# Patient Record
Sex: Female | Born: 1975
Health system: Southern US, Community
[De-identification: ages and names within clinical notes are randomized; demographics above are authoritative.]

## PROBLEM LIST (undated history)

## (undated) DIAGNOSIS — K219 Gastro-esophageal reflux disease without esophagitis: Secondary | ICD-10-CM

## (undated) DIAGNOSIS — G4733 Obstructive sleep apnea (adult) (pediatric): Principal | ICD-10-CM

## (undated) DIAGNOSIS — E119 Type 2 diabetes mellitus without complications: Secondary | ICD-10-CM

## (undated) DIAGNOSIS — I10 Essential (primary) hypertension: Secondary | ICD-10-CM

## (undated) HISTORY — DX: Gastro-esophageal reflux disease without esophagitis: K21.9

## (undated) HISTORY — DX: Obstructive sleep apnea (adult) (pediatric): G47.33

## (undated) HISTORY — DX: Essential (primary) hypertension: I10

## (undated) HISTORY — DX: Morbid (severe) obesity due to excess calories: E66.01

## (undated) HISTORY — PX: TUBAL LIGATION: SHX77

## (undated) HISTORY — DX: Type 2 diabetes mellitus without complications: E11.9

---

## 2003-08-13 DIAGNOSIS — IMO0001 Reserved for inherently not codable concepts without codable children: Secondary | ICD-10-CM

## 2003-08-13 HISTORY — DX: Reserved for inherently not codable concepts without codable children: IMO0001

## 2006-03-06 ENCOUNTER — Encounter (INDEPENDENT_AMBULATORY_CARE_PROVIDER_SITE_OTHER): Payer: Self-pay | Admitting: Internal Medicine

## 2006-03-06 LAB — CONVERTED CEMR LAB: TSH: 0.692 microintl units/mL

## 2006-03-12 ENCOUNTER — Ambulatory Visit: Payer: Self-pay | Admitting: Internal Medicine

## 2006-04-07 ENCOUNTER — Encounter (INDEPENDENT_AMBULATORY_CARE_PROVIDER_SITE_OTHER): Payer: Self-pay | Admitting: Internal Medicine

## 2006-04-09 ENCOUNTER — Ambulatory Visit: Payer: Self-pay | Admitting: Internal Medicine

## 2006-05-08 ENCOUNTER — Ambulatory Visit: Payer: Self-pay | Admitting: Internal Medicine

## 2006-06-09 ENCOUNTER — Ambulatory Visit: Payer: Self-pay | Admitting: Internal Medicine

## 2006-07-11 ENCOUNTER — Encounter (INDEPENDENT_AMBULATORY_CARE_PROVIDER_SITE_OTHER): Payer: Self-pay | Admitting: Internal Medicine

## 2006-09-09 ENCOUNTER — Ambulatory Visit: Payer: Self-pay | Admitting: Internal Medicine

## 2006-09-10 ENCOUNTER — Encounter (INDEPENDENT_AMBULATORY_CARE_PROVIDER_SITE_OTHER): Payer: Self-pay | Admitting: Internal Medicine

## 2006-09-10 LAB — CONVERTED CEMR LAB
BUN: 12 mg/dL (ref 6–23)
CO2: 21 meq/L (ref 19–32)
Calcium: 9.4 mg/dL (ref 8.4–10.5)
Glucose, Bld: 149 mg/dL — ABNORMAL HIGH (ref 70–99)
HDL: 53 mg/dL (ref >39)
Potassium: 4.7 meq/L (ref 3.5–5.3)
Total CHOL/HDL Ratio: 3.4
Triglycerides: 54 mg/dL (ref <150)
VLDL: 11 mg/dL (ref 0–40)

## 2006-09-13 ENCOUNTER — Encounter (INDEPENDENT_AMBULATORY_CARE_PROVIDER_SITE_OTHER): Payer: Self-pay | Admitting: Internal Medicine

## 2006-09-13 LAB — CONVERTED CEMR LAB
Creatinine, Urine: 180.5 mg/dL
Microalb Creat Ratio: 8.2 mg/g (ref 0.0–30.0)

## 2006-09-17 ENCOUNTER — Telehealth (INDEPENDENT_AMBULATORY_CARE_PROVIDER_SITE_OTHER): Payer: Self-pay | Admitting: Internal Medicine

## 2006-09-17 ENCOUNTER — Ambulatory Visit: Payer: Self-pay | Admitting: Internal Medicine

## 2006-09-17 DIAGNOSIS — K219 Gastro-esophageal reflux disease without esophagitis: Secondary | ICD-10-CM | POA: Insufficient documentation

## 2006-09-17 DIAGNOSIS — E1159 Type 2 diabetes mellitus with other circulatory complications: Secondary | ICD-10-CM | POA: Insufficient documentation

## 2006-09-17 DIAGNOSIS — E1169 Type 2 diabetes mellitus with other specified complication: Secondary | ICD-10-CM

## 2006-09-17 DIAGNOSIS — I10 Essential (primary) hypertension: Secondary | ICD-10-CM | POA: Insufficient documentation

## 2006-09-17 DIAGNOSIS — E669 Obesity, unspecified: Secondary | ICD-10-CM

## 2006-10-07 ENCOUNTER — Ambulatory Visit: Payer: Self-pay | Admitting: Internal Medicine

## 2006-12-03 ENCOUNTER — Encounter (INDEPENDENT_AMBULATORY_CARE_PROVIDER_SITE_OTHER): Payer: Self-pay | Admitting: Internal Medicine

## 2006-12-24 ENCOUNTER — Encounter (INDEPENDENT_AMBULATORY_CARE_PROVIDER_SITE_OTHER): Payer: Self-pay | Admitting: Internal Medicine

## 2006-12-26 ENCOUNTER — Ambulatory Visit: Payer: Self-pay | Admitting: Internal Medicine

## 2007-03-31 ENCOUNTER — Ambulatory Visit: Payer: Self-pay | Admitting: Internal Medicine

## 2007-03-31 LAB — CONVERTED CEMR LAB: Hgb A1c MFr Bld: 6.7 %

## 2007-11-06 ENCOUNTER — Telehealth (INDEPENDENT_AMBULATORY_CARE_PROVIDER_SITE_OTHER): Payer: Self-pay | Admitting: Internal Medicine

## 2007-12-25 ENCOUNTER — Inpatient Hospital Stay (HOSPITAL_COMMUNITY): Admission: AD | Admit: 2007-12-25 | Discharge: 2007-12-25 | Payer: Self-pay | Admitting: Obstetrics and Gynecology

## 2008-01-17 ENCOUNTER — Inpatient Hospital Stay (HOSPITAL_COMMUNITY): Admission: AD | Admit: 2008-01-17 | Discharge: 2008-01-18 | Payer: Self-pay | Admitting: Obstetrics and Gynecology

## 2008-01-21 ENCOUNTER — Inpatient Hospital Stay (HOSPITAL_COMMUNITY): Admission: AD | Admit: 2008-01-21 | Discharge: 2008-01-21 | Payer: Self-pay | Admitting: Obstetrics and Gynecology

## 2008-01-25 ENCOUNTER — Encounter (INDEPENDENT_AMBULATORY_CARE_PROVIDER_SITE_OTHER): Payer: Self-pay | Admitting: Internal Medicine

## 2008-01-25 ENCOUNTER — Inpatient Hospital Stay (HOSPITAL_COMMUNITY): Admission: AD | Admit: 2008-01-25 | Discharge: 2008-01-29 | Payer: Self-pay | Admitting: Obstetrics and Gynecology

## 2008-01-26 ENCOUNTER — Encounter (INDEPENDENT_AMBULATORY_CARE_PROVIDER_SITE_OTHER): Payer: Self-pay | Admitting: Obstetrics and Gynecology

## 2008-05-02 ENCOUNTER — Ambulatory Visit: Payer: Self-pay | Admitting: Internal Medicine

## 2008-05-03 LAB — CONVERTED CEMR LAB
ALT: 11 units/L (ref 0–35)
Albumin: 4.2 g/dL (ref 3.5–5.2)
BUN: 10 mg/dL (ref 6–23)
Calcium: 8.9 mg/dL (ref 8.4–10.5)
Creatinine, Ser: 0.7 mg/dL (ref 0.40–1.20)
HDL: 45 mg/dL (ref >39)
LDL Cholesterol: 117 mg/dL — ABNORMAL HIGH (ref 0–99)
Total Bilirubin: 0.4 mg/dL (ref 0.3–1.2)
Total Protein: 7.3 g/dL (ref 6.0–8.3)
VLDL: 21 mg/dL (ref 0–40)

## 2008-05-30 ENCOUNTER — Telehealth (INDEPENDENT_AMBULATORY_CARE_PROVIDER_SITE_OTHER): Payer: Self-pay | Admitting: Internal Medicine

## 2008-08-01 ENCOUNTER — Ambulatory Visit: Payer: Self-pay | Admitting: Internal Medicine

## 2008-08-01 LAB — CONVERTED CEMR LAB: Hgb A1c MFr Bld: 6.5 %

## 2008-08-03 LAB — CONVERTED CEMR LAB
Creatinine, Urine: 178.7 mg/dL
Microalb Creat Ratio: 32.9 mg/g — ABNORMAL HIGH (ref 0.0–30.0)
Microalb, Ur: 5.88 mg/dL — ABNORMAL HIGH (ref 0.00–1.89)

## 2008-10-25 LAB — HM DIABETES EYE EXAM: HM Diabetic Eye Exam: NORMAL

## 2008-10-31 ENCOUNTER — Ambulatory Visit: Payer: Self-pay | Admitting: Internal Medicine

## 2008-10-31 LAB — CONVERTED CEMR LAB: Hgb A1c MFr Bld: 7 %

## 2008-11-04 ENCOUNTER — Ambulatory Visit (HOSPITAL_COMMUNITY): Admission: RE | Admit: 2008-11-04 | Discharge: 2008-11-04 | Payer: Self-pay | Admitting: Internal Medicine

## 2008-11-04 ENCOUNTER — Encounter (INDEPENDENT_AMBULATORY_CARE_PROVIDER_SITE_OTHER): Payer: Self-pay | Admitting: Internal Medicine

## 2008-11-14 ENCOUNTER — Ambulatory Visit: Payer: Self-pay | Admitting: Internal Medicine

## 2008-11-17 ENCOUNTER — Encounter (INDEPENDENT_AMBULATORY_CARE_PROVIDER_SITE_OTHER): Payer: Self-pay | Admitting: Internal Medicine

## 2008-11-18 LAB — CONVERTED CEMR LAB
Alkaline Phosphatase: 83 units/L (ref 39–117)
BUN: 12 mg/dL (ref 6–23)
CO2: 25 meq/L (ref 19–32)
Calcium: 9.4 mg/dL (ref 8.4–10.5)
Chloride: 104 meq/L (ref 96–112)
Creatinine, Ser: 0.75 mg/dL (ref 0.40–1.20)
Glucose, Bld: 162 mg/dL — ABNORMAL HIGH (ref 70–99)
HDL: 44 mg/dL (ref >39)
LDL Cholesterol: 106 mg/dL — ABNORMAL HIGH (ref 0–99)
Total Bilirubin: 0.3 mg/dL (ref 0.3–1.2)
Total CHOL/HDL Ratio: 3.7
Triglycerides: 56 mg/dL (ref <150)
VLDL: 11 mg/dL (ref 0–40)

## 2008-12-05 ENCOUNTER — Encounter (INDEPENDENT_AMBULATORY_CARE_PROVIDER_SITE_OTHER): Payer: Self-pay | Admitting: Internal Medicine

## 2008-12-06 ENCOUNTER — Encounter (INDEPENDENT_AMBULATORY_CARE_PROVIDER_SITE_OTHER): Payer: Self-pay | Admitting: Internal Medicine

## 2009-01-23 ENCOUNTER — Ambulatory Visit: Payer: Self-pay | Admitting: Internal Medicine

## 2009-01-23 DIAGNOSIS — B353 Tinea pedis: Secondary | ICD-10-CM | POA: Insufficient documentation

## 2009-01-23 LAB — CONVERTED CEMR LAB: Hgb A1c MFr Bld: 7.2 %

## 2009-02-23 ENCOUNTER — Other Ambulatory Visit: Admission: RE | Admit: 2009-02-23 | Discharge: 2009-02-23 | Payer: Self-pay | Admitting: Obstetrics and Gynecology

## 2009-02-23 ENCOUNTER — Telehealth (INDEPENDENT_AMBULATORY_CARE_PROVIDER_SITE_OTHER): Payer: Self-pay | Admitting: *Deleted

## 2009-04-10 ENCOUNTER — Encounter (INDEPENDENT_AMBULATORY_CARE_PROVIDER_SITE_OTHER): Payer: Self-pay | Admitting: Internal Medicine

## 2009-08-14 ENCOUNTER — Ambulatory Visit: Payer: Self-pay | Admitting: Family Medicine

## 2009-08-14 DIAGNOSIS — R5383 Other fatigue: Secondary | ICD-10-CM

## 2009-08-14 DIAGNOSIS — R5381 Other malaise: Secondary | ICD-10-CM | POA: Insufficient documentation

## 2009-08-14 LAB — CONVERTED CEMR LAB
AST: 14 units/L (ref 0–37)
BUN: 12 mg/dL (ref 6–23)
CO2: 24 meq/L (ref 19–32)
Calcium: 9.6 mg/dL (ref 8.4–10.5)
Creatinine, Ser: 0.8 mg/dL (ref 0.40–1.20)
Creatinine, Urine: 289.5 mg/dL
Glucose, Bld: 169 mg/dL
Glucose, Bld: 143 mg/dL — ABNORMAL HIGH (ref 70–99)
HCT: 37.2 % (ref 36.0–46.0)
HDL: 37 mg/dL — ABNORMAL LOW (ref >39)
Hemoglobin: 12.3 g/dL (ref 12.0–15.0)
Hgb A1c MFr Bld: 6.7 %
LDL Cholesterol: 106 mg/dL — ABNORMAL HIGH (ref 0–99)
MCHC: 33.1 g/dL (ref 30.0–36.0)
MCV: 80.3 fL (ref 78.0–100.0)
RBC: 4.63 M/uL (ref 3.87–5.11)
TSH: 0.385 microintl units/mL (ref 0.350–4.500)
Total Bilirubin: 0.6 mg/dL (ref 0.3–1.2)
Total Protein: 7.3 g/dL (ref 6.0–8.3)
WBC: 9.9 10*3/uL (ref 4.0–10.5)

## 2009-08-22 DIAGNOSIS — E669 Obesity, unspecified: Secondary | ICD-10-CM | POA: Insufficient documentation

## 2009-10-16 ENCOUNTER — Telehealth: Payer: Self-pay | Admitting: Physician Assistant

## 2009-10-18 ENCOUNTER — Telehealth: Payer: Self-pay | Admitting: Family Medicine

## 2009-10-23 ENCOUNTER — Ambulatory Visit: Payer: Self-pay | Admitting: Family Medicine

## 2009-12-05 ENCOUNTER — Ambulatory Visit: Payer: Self-pay | Admitting: Family Medicine

## 2009-12-05 LAB — HM DIABETES FOOT EXAM

## 2009-12-06 LAB — CONVERTED CEMR LAB
Calcium: 9.7 mg/dL (ref 8.4–10.5)
Glucose, Bld: 106 mg/dL — ABNORMAL HIGH (ref 70–99)
Potassium: 4.1 meq/L (ref 3.5–5.3)

## 2010-03-22 ENCOUNTER — Encounter: Payer: Self-pay | Admitting: Family Medicine

## 2010-03-22 ENCOUNTER — Other Ambulatory Visit: Admission: RE | Admit: 2010-03-22 | Discharge: 2010-03-22 | Payer: Self-pay | Admitting: Obstetrics and Gynecology

## 2010-03-28 ENCOUNTER — Telehealth: Payer: Self-pay | Admitting: Family Medicine

## 2010-03-28 ENCOUNTER — Ambulatory Visit: Payer: Self-pay | Admitting: Family Medicine

## 2010-03-28 DIAGNOSIS — M5432 Sciatica, left side: Secondary | ICD-10-CM | POA: Insufficient documentation

## 2010-03-30 ENCOUNTER — Encounter: Payer: Self-pay | Admitting: Family Medicine

## 2010-03-30 ENCOUNTER — Ambulatory Visit (HOSPITAL_COMMUNITY): Admission: RE | Admit: 2010-03-30 | Discharge: 2010-03-30 | Payer: Self-pay | Admitting: Family Medicine

## 2010-04-02 LAB — CONVERTED CEMR LAB
ALT: 23 units/L (ref 0–35)
AST: 17 units/L (ref 0–37)
Alkaline Phosphatase: 72 units/L (ref 39–117)
CO2: 32 meq/L (ref 19–32)
Glucose, Bld: 140 mg/dL — ABNORMAL HIGH (ref 70–99)
HDL: 39 mg/dL — ABNORMAL LOW (ref >39)
Hgb A1c MFr Bld: 7.2 % — ABNORMAL HIGH (ref <5.7)
LDL Cholesterol: 82 mg/dL (ref 0–99)
Sodium: 141 meq/L (ref 135–145)
Total CHOL/HDL Ratio: 3.5
Total Protein: 7 g/dL (ref 6.0–8.3)
Triglycerides: 77 mg/dL (ref <150)
VLDL: 15 mg/dL (ref 0–40)

## 2010-04-06 ENCOUNTER — Telehealth: Payer: Self-pay | Admitting: Family Medicine

## 2010-04-19 ENCOUNTER — Telehealth: Payer: Self-pay | Admitting: Family Medicine

## 2010-05-01 ENCOUNTER — Ambulatory Visit (HOSPITAL_COMMUNITY): Admission: RE | Admit: 2010-05-01 | Discharge: 2010-05-01 | Payer: Self-pay | Admitting: Family Medicine

## 2010-05-02 ENCOUNTER — Telehealth: Payer: Self-pay | Admitting: Family Medicine

## 2010-05-03 ENCOUNTER — Telehealth: Payer: Self-pay | Admitting: Family Medicine

## 2010-05-21 ENCOUNTER — Encounter: Payer: Self-pay | Admitting: Family Medicine

## 2010-05-25 HISTORY — PX: BACK SURGERY: SHX140

## 2010-07-11 ENCOUNTER — Ambulatory Visit: Payer: Self-pay | Admitting: Family Medicine

## 2010-07-13 ENCOUNTER — Telehealth: Payer: Self-pay | Admitting: Family Medicine

## 2010-07-17 ENCOUNTER — Encounter: Payer: Self-pay | Admitting: Family Medicine

## 2010-07-23 ENCOUNTER — Telehealth: Payer: Self-pay | Admitting: Family Medicine

## 2010-09-11 NOTE — Assessment & Plan Note (Signed)
Summary: BLOOD SUGARS   Vital Signs:  Patient profile:   35 year old female Height:      63 inches Weight:      195 pounds BMI:     34.67 O2 Sat:      97 % Pulse rate:   98 / minute Pulse rhythm:   regular Resp:     16 per minute BP sitting:   120 / 72  (left arm) Cuff size:   large  Vitals Entered By: Everitt Amber LPN (October 23, 2009 10:13 AM)  Nutrition Counseling: Patient's BMI is greater than 25 and therefore counseled on weight management options. CC: Follow up chronic problems Is Patient Diabetic? Yes   Primary Care Provider:  Syliva Overman MD  CC:  Follow up chronic problems.  History of Present Illness: Pt called in last week stating her blood sugars are still high. She has tolerated janumet whereas she did not tolerate metformin. She has been exercising , has cut calories and lost 4 pounds.She is asymptomatic as far as uncontrolled blood sugars are concerned. She has otherwise been well with no complaints or concerns.  Denies recent fever or chills. Denies sinus pressure, nasal congestion , ear pain or sore throat. Denies chest congestion, or cough productive of sputum. Denies chest pain, palpitations, PND, orthopnea or leg swelling. Denies abdominal pain, nausea, vomitting, diarrhea or constipation. Denies change in bowel movements or bloody stool. Denies dysuria , frequency, incontinence or hesitancy. Denies  joint pain, swelling, or reduced mobility. Denies headaches, vertigo, seizures. Denies depression, anxiety or insomnia. Denies  rash, lesions, or itch.     Current Medications (verified): 1)  Glucometer Strips For One Touch Ultra Mini .... Use As Directed 2)  Lancets .... Use As Directed 3)  Propranolol Hcl 40 Mg Tabs (Propranolol Hcl) .Marland Kitchen.. 1 By Mouth Two Times A Day 4)  Lisinopril 20 Mg Tabs (Lisinopril) .... Take 1 Tablet By Mouth Once A Day 5)  Hydrochlorothiazide 25 Mg Tabs (Hydrochlorothiazide) .... Take 1 Tablet By Mouth Once A Day 6)   Janumet 50-500 Mg Tabs (Sitagliptin-Metformin Hcl) .... Take 1 Tablet By Mouth Two Times A Day 7)  Onetouch Ultra Test  Strp (Glucose Blood) .... Once Daily Testing  Allergies (verified): 1)  Amlodipine Besylate (Amlodipine Besylate)  Review of Systems Eyes:  Denies blurring, discharge, eye pain, and red eye. Endo:  Denies cold intolerance, excessive hunger, excessive thirst, excessive urination, heat intolerance, polyuria, and weight change; tests twice daily at different times fastings avg 160's, lower #'s tend to be early afternoon at 130's. Heme:  Denies abnormal bruising and bleeding. Allergy:  Denies hives or rash and itching eyes.  Physical Exam  General:  Well-developed,well-nourished,in no acute distress; alert,appropriate and cooperative throughout examination HEENT: No facial asymmetry,  EOMI, No sinus tenderness, TM's Clear, oropharynx  pink and moist.   Chest: Clear to auscultation bilaterally.  CVS: S1, S2, No murmurs, No S3.   Abd: Soft, Nontender.  MS: Adequate ROM spine, hips, shoulders and knees.  Ext: No edema.   CNS: CN 2-12 intact, power tone and sensation normal throughout.   Skin: Intact, no visible lesions or rashes.  Psych: Good eye contact, normal affect.  Memory intact, not anxious or depressed appearing.    Impression & Recommendations:  Problem # 1:  OBESITY (ICD-278.00) Assessment Improved  Ht: 63 (10/23/2009)   Wt: 195 (10/23/2009)   BMI: 34.67 (10/23/2009)  Problem # 2:  HYPERLIPIDEMIA (ICD-272.4) Assessment: Comment Only  Her updated medication list  for this problem includes:    Lovastatin 10 Mg Tabs (Lovastatin) .Marland Kitchen... Take 1 tab by mouth at bedtime  Labs Reviewed: SGOT: 14 (08/14/2009)   SGPT: 19 (08/14/2009)   HDL:37 (08/14/2009), 44 (11/17/2008)  LDL:106 (08/14/2009), 106 (11/17/2008)  Chol:154 (08/14/2009), 161 (11/17/2008)  Trig:55 (08/14/2009), 56 (11/17/2008)  Problem # 3:  DIABETES MELLITUS, TYPE II, CONTROLLED, W/O COMPLICATIONS  (ICD-250.00) Assessment: Comment Only  The following medications were removed from the medication list:    Janumet 50-500 Mg Tabs (Sitagliptin-metformin hcl) .Marland Kitchen... Take 1 tablet by mouth two times a day Her updated medication list for this problem includes:    Lisinopril 20 Mg Tabs (Lisinopril) .Marland Kitchen... Take 1 tablet by mouth once a day    Janumet 50-1000 Mg Tabs (Sitagliptin-metformin hcl) .Marland Kitchen... Take 1 tablet by mouth two times a day    Metformin Hcl 500 Mg Tabs (Metformin hcl) .Marland Kitchen... Take 1 tablet by mouth two times a day  Labs Reviewed: Creat: 0.80 (08/14/2009)     Last Eye Exam: normal (10/25/2008) Reviewed HgBA1c results: 6.7 (08/14/2009)  7.2 (01/23/2009)  Problem # 4:  HYPERTENSION (ICD-401.9) Assessment: Unchanged  Her updated medication list for this problem includes:    Propranolol Hcl 40 Mg Tabs (Propranolol hcl) .Marland Kitchen... 1 by mouth two times a day    Lisinopril 20 Mg Tabs (Lisinopril) .Marland Kitchen... Take 1 tablet by mouth once a day    Hydrochlorothiazide 25 Mg Tabs (Hydrochlorothiazide) .Marland Kitchen... Take 1 tablet by mouth once a day  BP today: 120/72 Prior BP: 120/80 (08/14/2009)  Labs Reviewed: K+: 4.4 (08/14/2009) Creat: : 0.80 (08/14/2009)   Chol: 154 (08/14/2009)   HDL: 37 (08/14/2009)   LDL: 106 (08/14/2009)   TG: 55 (08/14/2009)  Complete Medication List: 1)  Glucometer Strips For One Touch Ultra Mini  .... Use as directed 2)  Lancets  .... Use as directed 3)  Propranolol Hcl 40 Mg Tabs (Propranolol hcl) .Marland Kitchen.. 1 by mouth two times a day 4)  Lisinopril 20 Mg Tabs (Lisinopril) .... Take 1 tablet by mouth once a day 5)  Hydrochlorothiazide 25 Mg Tabs (Hydrochlorothiazide) .... Take 1 tablet by mouth once a day 6)  Onetouch Ultra Test Strp (Glucose blood) .... Once daily testing 7)  Janumet 50-1000 Mg Tabs (Sitagliptin-metformin hcl) .... Take 1 tablet by mouth two times a day 8)  Lovastatin 10 Mg Tabs (Lovastatin) .... Take 1 tab by mouth at bedtime 9)  Metformin Hcl 500 Mg Tabs  (Metformin hcl) .... Take 1 tablet by mouth two times a day  Patient Instructions: 1)  F/U in 6 weeks.Cancel earlier appt pls. 2)  New  dose janumetis 50/1000 one twice daily, use metformin 500mg  one twice daily with current lowerdose of janumet till all done. 3)  Congrats on weight loss, keep it up. 4)  It is important that you exercise regularly at least 60 minutes 6 times a week. If you develop chest pain, have severe difficulty breathing, or feel very tired , stop exercising immediately and seek medical attention. Prescriptions: METFORMIN HCL 500 MG TABS (METFORMIN HCL) Take 1 tablet by mouth two times a day  #60 x 0   Entered and Authorized by:   Syliva Overman MD   Signed by:   Syliva Overman MD on 10/23/2009   Method used:   Print then Give to Patient   RxID:   [id number] LOVASTATIN 10 MG TABS (LOVASTATIN) Take 1 tab by mouth at bedtime  #90 x 1   Entered and Authorized by:  Syliva Overman MD   Signed by:   Syliva Overman MD on 10/23/2009   Method used:   Electronically to        [facility] (retail)       [address]       [place], [place]  [address]       Ph: [phone]       Fax: [phone]   RxID:   [id number] JANUMET 50-1000 MG TABS (SITAGLIPTIN-METFORMIN HCL) Take 1 tablet by mouth two times a day  #60 x 3   Entered and Authorized by:   Syliva Overman MD   Signed by:   Syliva Overman MD on 10/23/2009   Method used:   Printed then faxed to ...       [facility] (retail)       [address]       [place], [place]  [address]       Ph: [phone]       Fax: [phone]   RxID:   [id number]

## 2010-09-11 NOTE — Progress Notes (Signed)
Summary: lab results  Phone Note Call from Patient   Summary of Call: wants to get lab results call back at [phone] Initial call taken by: Lind Guest,  July 13, 2010 11:14 AM  Follow-up for Phone Call        see lab append Follow-up by: Adella Hare LPN,  July 13, 2010 2:10 PM

## 2010-09-11 NOTE — Assessment & Plan Note (Signed)
Summary: office visit   Vital Signs:  Patient profile:   35 year old female Height:      63 inches Weight:      195.50 pounds BMI:     34.76 O2 Sat:      98 % on Room air Pulse rate:   75 / minute Pulse rhythm:   regular Resp:     16 per minute BP sitting:   136 / 92  (left arm)  Vitals Entered By: Adella Hare LPN (July 11, 2010 8:18 AM)  Nutrition Counseling: Patient's BMI is greater than 25 and therefore counseled on weight management options.  O2 Flow:  Room air CC: follow-up visit Is Patient Diabetic? Yes Did you bring your meter with you today? No Pain Assessment Patient in pain? no        Primary Care Provider:  Syliva Overman MD  CC:  follow-up visit.  History of Present Illness: Doing well since surgery on her back. No pain Intolerant of metformin, now wants to wait 1 yr before conception, blood sugars are uncontrolled , interested in going back on janumet if she is able to afford it, without a coupon she recently paid $40. Reports  that she has been doing generally well otherwise Denies recent fever or chills. Denies sinus pressure, nasal congestion , ear pain or sore throat. Denies chest congestion, or cough productive of sputum. Denies chest pain, palpitations, PND, orthopnea or leg swelling. Denies abdominal pain, nausea, vomitting, diarrhea or constipation. Denies change in bowel movements or bloody stool. Denies dysuria , frequency, incontinence or hesitancy. Denies  joint pain, swelling, or reduced mobility. Denies headaches, vertigo, seizures. Denies depression, anxiety or insomnia. Denies  rash, lesions, or itch.     Current Medications (verified): 1)  Propranolol Hcl 40 Mg Tabs (Propranolol Hcl) .Marland Kitchen.. 1 By Mouth Two Times A Day 2)  Hydrochlorothiazide 25 Mg Tabs (Hydrochlorothiazide) .... Take 1 Tablet By Mouth Once A Day 3)  Onetouch Ultra Test  Strp (Glucose Blood) .... Once Daily Testing 4)  Medroxyprogesterone Acetate 10 Mg Tabs  (Medroxyprogesterone Acetate) .... Take 1 Tab By Mouth At Bedtime For 10 Days Every 2 Months 5)  Metformin Hcl 1000 Mg Tabs (Metformin Hcl) .... Take 1 Tablet By Mouth Two Times A Day 6)  Glyburide 2.5 Mg Tabs (Glyburide) .... Take 1 Tablet By Mouth Two Times A Day 7)  Onetouch Ultrasoft Lancets  Misc (Lancets) .... Once Daily Testing  Allergies (verified): 1)  Amlodipine Besylate (Amlodipine Besylate)  Past History:  Past medical, surgical, family and social histories (including risk factors) reviewed, and no changes noted (except as noted below).  Past Medical History: Reviewed history from 08/14/2009 and no changes required. GERD Hypertension   since approx 2005 constipation NIDDM, no complications since approx 2005 mild B/L cataracts morbid obesity  Past Surgical History: Caesarean section 6/09 Back surgery 05/25/2010, Dr Ovid Curd  Family History: Reviewed history from 08/14/2009 and no changes required. Father-58-DM, HTN, kidney disease, esrd Mother-54-DM- HTN Brother-33, healthy  Social History: Reviewed history from 08/14/2009 and no changes required. 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 MS:  Complains of low back pain and mid back pain. Endo:  Denies excessive thirst  and excessive urination; fasting in past 2 weeks  90 to 215, pre dinner 19 to 183. Heme:  Denies abnormal bruising and bleeding. Allergy:  Denies hives or rash and itching eyes.  Physical Exam  General:  Well-developed,well-nourished,in no acute distress; alert,appropriate and cooperative throughout examination HEENT: No facial asymmetry,  EOMI, No sinus tenderness, TM's Clear, oropharynx  pink and moist.   Chest:  Clear to auscultation bilaterally.  CVS: S1, S2, No murmurs, No S3.   Abd: Soft, Nontender.  MS: adequate  ROM spine,adequate in  hips, shoulders and knees.  Ext: No edema.   CNS: CN 2-12 intact, power and  tone normal,sensationreduced in right lower extremity  Skin: Intact, no visible lesions or rashes.  Psych: Good eye contact, normal affect.  Memory intact, not anxious or depressed appearing.    Impression & Recommendations:  Problem # 1:  BACK PAIN WITH RADICULOPATHY (ICD-729.2) Assessment Improved s/p surgery  Problem # 2:  OBESITY (ICD-278.00) Assessment: Unchanged  Ht: 63 (07/11/2010)   Wt: 195.50 (07/11/2010)   BMI: 34.76 (07/11/2010) therapeutic lifestyle change discussed and encouraged  Problem # 3:  DIABETES MELLITUS, TYPE II, CONTROLLED, W/O COMPLICATIONS (ICD-250.00) Assessment: Deteriorated  The following medications were removed from the medication list:    Metformin Hcl 1000 Mg Tabs (Metformin hcl) .Marland Kitchen... Take 1 tablet by mouth two times a day Her updated medication list for this problem includes:    Glyburide 2.5 Mg Tabs (Glyburide) .Marland Kitchen... Take 1 tablet by mouth two times a day    Janumet 50-500 Mg Tabs (Sitagliptin-metformin hcl) .Marland Kitchen... Take 1 tablet by mouth two times a day  Orders: T- Hemoglobin A1C ([address]) T- Hemoglobin A1C ([address]-91478)  Labs Reviewed: Creat: 0.77 (03/26/2010)     Last Eye Exam: normal (10/25/2008) Reviewed HgBA1c results: 7.2 (03/26/2010)  7.3 (12/05/2009)  Problem # 4:  HYPERTENSION (ICD-401.9) Assessment: Deteriorated  Her updated medication list for this problem includes:    Propranolol Hcl 40 Mg Tabs (Propranolol hcl) .Marland Kitchen... 1 by mouth two times a day    Hydrochlorothiazide 25 Mg Tabs (Hydrochlorothiazide) .Marland Kitchen... Take 1 tablet by mouth once a day Patient advised to follow low sodium diet rich in fruit and vegetables, and to commit to at least 30 minutes 5 days per week of regular exercise , to improve blood presure control.    BP today: 136/92 Prior BP: 110/68 (03/28/2010)  Labs Reviewed: K+: 3.6 (03/26/2010) Creat: : 0.77 (03/26/2010)   Chol: 136 (03/26/2010)   HDL: 39 (03/26/2010)   LDL: 82 (03/26/2010)   TG: 77 (03/26/2010)  Complete Medication List: 1)  Propranolol Hcl 40 Mg Tabs (Propranolol hcl) .Marland Kitchen.. 1 by mouth two times a day 2)  Hydrochlorothiazide 25 Mg Tabs (Hydrochlorothiazide) .... Take 1 tablet by mouth once a day 3)  Onetouch Ultra Test Strp (Glucose blood) .... Once daily testing 4)  Medroxyprogesterone Acetate 10 Mg Tabs (Medroxyprogesterone acetate) .... Take 1 tab by mouth at bedtime for 10 days every 2 months 5)  Glyburide 2.5 Mg Tabs (Glyburide) .... Take 1 tablet by mouth two times a day 6)  Onetouch Ultrasoft Lancets Misc (Lancets) .... Once daily testing 7)  Janumet 50-500 Mg Tabs (Sitagliptin-metformin hcl) .... Take 1 tablet by mouth two times a day  Other Orders: T-Basic Metabolic Panel [id number] [id number])  Patient Instructions: 1)  Please schedule a follow-up appointment in 3.5 months. 2)  It is important that you exercise regularly at least 20 minutes 5 times a week. If you develop chest pain, have severe difficulty  breathing, or feel very tired , stop exercising immediately and seek medical attention. 3)  You need to lose weight. Consider a lower calorie diet and regular exercise.  4)  Check your blood sugars regularly.Once daily. If your readings are usually above 200 or below 70 you should contact our office. 5)  PLS get a flu vaccine 6)  HbgA1C prior to visit, ICD-9:  today, 7)  HBA1C in 3.5 months and chem 7 8)  Stop metformin pls, resume janumet as before , and continue the glyburide. Prescriptions: JANUMET 50-500 MG TABS (SITAGLIPTIN-METFORMIN HCL) Take 1 tablet by mouth two times a day  #60 x 4   Entered and Authorized by:   Syliva Overman MD   Signed by:   Syliva Overman MD on 07/11/2010   Method used:   Printed then faxed to ...       [facility]  (retail)       [address]       [place], [place]  [address]       Ph: [phone]       Fax: [phone]   RxID:   [id number]    Orders Added: 1)  Est. Patient Level IV [[address]214] 2)  T- Hemoglobin A1C [83036-23375] 3)  T-Basic Metabolic Panel [[id number]] 4)  T- Hemoglobin A1C [83036-23375]   janumet 50/500 samples given lot [id number] exp 4/13

## 2010-09-11 NOTE — Miscellaneous (Signed)
Summary: flu shot  Clinical Lists Changes  Observations: Added new observation of FLU VAX: Historical (03/06/2010 9:01)      Influenza Immunization History:    Influenza # 1:  Historical (03/06/2010)

## 2010-09-11 NOTE — Assessment & Plan Note (Signed)
Summary: ov   Vital Signs:  Patient profile:   35 year old female Height:      63 inches Weight:      189 pounds BMI:     33.60 O2 Sat:      98 % Pulse rate:   75 / minute Pulse rhythm:   regular Resp:     16 per minute BP sitting:   110 / 68  (left arm) Cuff size:   large  Vitals Entered By: Everitt Amber LPN (March 28, 2010 7:58 AM)  Nutrition Counseling: Patient's BMI is greater than 25 and therefore counseled on weight management options. CC: started having pain in back and she went to the ER and they said it was back spasms, then it moved down her left leg and has been causing left leg to ache and go numb   Primary Care Provider:  Syliva Overman MD  CC:  started having pain in back and she went to the ER and they said it was back spasms and then it moved down her left leg and has been causing left leg to ache and go numb.  History of Present Illness: Pt reports in June she had acute back pain after spending several nights in the hosp with her spouise, ED provided robaxin and diclofenac and it resolved. For the past 3 weeks she has been havign right lower ext pain, knee to buttocks, amd she decribes numbness from ankle to toes when she walks alot.  She has been experiencing muscle spasm and seeing her thigh muscles jump intermittently.Pain is as severe as an 8 at times she is concerned thaT HER BLOOD SUGARS HAVE BEEN UNCONTROLLED , WITH FASTINGS  averaging over 130. She has decided that she ants to have another child in the next 1 year, and wants to discuss review of her chronic meds in this light.  Allergies: 1)  Amlodipine Besylate (Amlodipine Besylate)  Review of Systems      See HPI Eyes:  Denies blurring, discharge, eye pain, and red eye. MS:  Complains of low back pain, mid back pain, muscle aches, and muscle weakness. Neuro:  Complains of numbness and tingling. Endo:  Denies excessive thirst and excessive urination. Heme:  Denies abnormal bruising and  bleeding. Allergy:  Denies hives or rash and itching eyes.  Physical Exam  General:  Well-developed,well-nourished,in no acute distress; alert,appropriate and cooperative throughout examination HEENT: No facial asymmetry,  EOMI, No sinus tenderness, TM's Clear, oropharynx  pink and moist.   Chest: Clear to auscultation bilaterally.  CVS: S1, S2, No murmurs, No S3.   Abd: Soft, Nontender.  MS: decreased  ROM spine,adequate in  hips, shoulders and knees.  Ext: No edema.   CNS: CN 2-12 intact, power and  tone normal,sensationreduced in right lower extremity  Skin: Intact, no visible lesions or rashes.  Psych: Good eye contact, normal affect.  Memory intact, not anxious or depressed appearing.    Impression & Recommendations:  Problem # 1:  BACK PAIN WITH RADICULOPATHY (ICD-729.2) Assessment Deteriorated  Orders: Diagnostic X-Ray/Fluoroscopy (Diagnostic X-Ray/Flu) Depo- Medrol 80mg  (J1040) Ketorolac-Toradol 15mg  (Z6109) Admin of Therapeutic Inj  intramuscular or subcutaneous (60454)  Problem # 2:  OBESITY (ICD-278.00) Assessment: Unchanged  Ht: 63 (03/28/2010)   Wt: 189 (03/28/2010)   BMI: 33.60 (03/28/2010)  Problem # 3:  DIABETES MELLITUS, TYPE II, CONTROLLED, W/O COMPLICATIONS (ICD-250.00) Assessment: Unchanged  The following medications were removed from the medication list:    Lisinopril 20 Mg Tabs (Lisinopril) .Marland Kitchen... Take  1 tablet by mouth once a day    Janumet 50-1000 Mg Tabs (Sitagliptin-metformin hcl) .Marland Kitchen... Take 1 tablet by mouth two times a day Her updated medication list for this problem includes:    Metformin Hcl 1000 Mg Tabs (Metformin hcl) .Marland Kitchen... Take 1 tablet by mouth two times a day    Glyburide 2.5 Mg Tabs (Glyburide) .Marland Kitchen... Take 1 tablet by mouth two times a day  Labs Reviewed: Creat: 0.77 (03/26/2010)     Last Eye Exam: normal (10/25/2008) Reviewed HgBA1c results: 7.2 (03/26/2010)  7.3 (12/05/2009)  Problem # 4:  HYPERTENSION (ICD-401.9) Assessment:  Unchanged  The following medications were removed from the medication list:    Lisinopril 20 Mg Tabs (Lisinopril) .Marland Kitchen... Take 1 tablet by mouth once a day Her updated medication list for this problem includes:    Propranolol Hcl 40 Mg Tabs (Propranolol hcl) .Marland Kitchen... 1 by mouth two times a day    Hydrochlorothiazide 25 Mg Tabs (Hydrochlorothiazide) .Marland Kitchen... Take 1 tablet by mouth once a day  BP today: 110/68 Prior BP: 120/80 (12/05/2009)  Labs Reviewed: K+: 3.6 (03/26/2010) Creat: : 0.77 (03/26/2010)   Chol: 136 (03/26/2010)   HDL: 39 (03/26/2010)   LDL: 82 (03/26/2010)   TG: 77 (03/26/2010)  Complete Medication List: 1)  Glucometer Strips For One Touch Ultra Mini  .... Use as directed 2)  Lancets  .... Use as directed 3)  Propranolol Hcl 40 Mg Tabs (Propranolol hcl) .Marland Kitchen.. 1 by mouth two times a day 4)  Hydrochlorothiazide 25 Mg Tabs (Hydrochlorothiazide) .... Take 1 tablet by mouth once a day 5)  Onetouch Ultra Test Strp (Glucose blood) .... Once daily testing 6)  Medroxyprogesterone Acetate 10 Mg Tabs (Medroxyprogesterone acetate) .... Take 1 tab by mouth at bedtime for 10 days every 2 months 7)  Ibuprofen 800 Mg Tabs (Ibuprofen) .... Take 1 tablet by mouth three times a day for 10 days 8)  Vicodin 5-500 Mg Tabs (Hydrocodone-acetaminophen) .... Take 1 tab by mouth at bedtime as needed 9)  Metformin Hcl 1000 Mg Tabs (Metformin hcl) .... Take 1 tablet by mouth two times a day 10)  Glyburide 2.5 Mg Tabs (Glyburide) .... Take 1 tablet by mouth two times a day  Patient Instructions: 1)  Please schedule a follow-up appointment in 3 months. 2)  It is important that you exercise regularly at least 20 minutes 5 times a week. If you develop chest pain, have severe difficulty breathing, or feel very tired , stop exercising immediately and seek medical attention. 3)  You need to lose weight. Consider a lower calorie diet and regular exercise.  4)  Check your blood sugars regularly. If your readings are  usually above : or below 70 you should contact our office. 5)  You will be treated for sciatica, and get an xray of your back. 6)  If symoptioms persist call for further management pls. 7)  Pls stop the lovastatin, lisinopril and janumet. 8)  I will call aboutyour 2nd diabetic med, for now you are on metformin Prescriptions: GLYBURIDE 2.5 MG TABS (GLYBURIDE) Take 1 tablet by mouth two times a day  #60 x 3   Entered and Authorized by:   Syliva Overman MD   Signed by:   Syliva Overman MD on 03/30/2010   Method used:   Printed then faxed to ...       [facility] (retail)       [address]  [place], [place]  [address]       Ph: [phone]       Fax: [phone]   RxID:   [id number] METFORMIN HCL 1000 MG TABS (METFORMIN HCL) Take 1 tablet by mouth two times a day  #60 x 3   Entered and Authorized by:   Syliva Overman MD   Signed by:   Syliva Overman MD on 03/28/2010   Method used:   Printed then faxed to ...       [facility] (retail)       [address]       [place], [place]  [address]       Ph: [phone]       Fax: [phone]   RxID:   [id number] VICODIN 5-500 MG TABS (HYDROCODONE-ACETAMINOPHEN) Take 1 tab by mouth at bedtime as needed  #30 x 0   Entered and Authorized by:   Syliva Overman MD   Signed by:   Syliva Overman MD on 03/28/2010   Method used:   Printed then faxed to ...       [facility] (retail)       [address]       [place], [place]  [address]       Ph: [phone]       Fax: [phone]   RxID:   [id number] PREDNISONE (PAK) 5 MG TABS (PREDNISONE) Use as directed  #21 x 0   Entered and Authorized by:   Syliva Overman MD   Signed by:   Syliva Overman MD on 03/28/2010   Method used:   Electronically to        [facility] (retail)       [address]       [place], [place]  [address]       Ph: [phone]        Fax: [phone]   RxID:   [id number] IBUPROFEN 800 MG TABS (IBUPROFEN) Take 1 tablet by mouth three times a day for 10 days  #30 x 0   Entered and Authorized by:   Syliva Overman MD   Signed by:   Syliva Overman MD on 03/28/2010   Method used:   Electronically to        [facility] (retail)       [address]       [place], [place]  [address]       Ph: [phone]       Fax: [phone]   RxID:   [id number]    Medication Administration  Injection # 1:    Medication: Depo- Medrol 80mg     Diagnosis: BACK PAIN WITH RADICULOPATHY (ICD-729.2)    Route: IM    Site: L thigh    Exp Date: 12/2010    Lot #: [id number]    Mfr: Pharmacia    Comments: 80mg  given     Patient tolerated injection without complications    Given by: Everitt Amber LPN (March 28, 2010 8:47 AM)  Injection # 2:    Medication: Ketorolac-Toradol 15mg     Diagnosis: BACK PAIN WITH RADICULOPATHY (ICD-729.2)    Route: IM    Site: RUOQ gluteus    Exp Date: 10/2011  Lot #: [id number]     Mfr: [email or web address]     Comments: 60mg  given     Patient tolerated injection without complications    Given by: Everitt Amber LPN (March 28, 2010 8:47 AM)  Orders Added: 1)  Est. Patient Level IV [[address]] 2)  Diagnostic X-Ray/Fluoroscopy [Diagnostic X-Ray/Flu] 3)  Depo- Medrol 80mg  [J1040] 4)  Ketorolac-Toradol 15mg  [J1885] 5)  Admin of Therapeutic Inj  intramuscular or subcutaneous [81191]

## 2010-09-11 NOTE — Progress Notes (Signed)
Summary: back pain   Phone Note Call from Patient   Summary of Call: pt would like to know if doc will call her in something for pain. [facility] [place] [phone] Initial call taken by: Rudene Anda,  May 02, 2010 9:50 AM  Follow-up for Phone Call        needs ov for this Follow-up by: Adella Hare LPN,  May 02, 2010 9:59 AM  Additional Follow-up for Phone Call Additional follow up Details #1::        pt had a MRI done and has a bulging disc. And had to refer her neurosurgeon. Already saw dr. for this. Do I need to schedule another appt for her here? Additional Follow-up by: Rudene Anda,  May 02, 2010 10:19 AM    Additional Follow-up for Phone Call Additional follow up Details #2::    Pt awaare that her dose of med is imncreased and the [facility] will [facility] Follow-up by: Syliva Overman MD,  May 02, 2010 11:59 AM  New/Updated Medications: VICODIN ES 7.5-750 MG TABS (HYDROCODONE-ACETAMINOPHEN) Take 1 tablet by mouth three times a day as needed Prescriptions: VICODIN ES 7.5-750 MG TABS (HYDROCODONE-ACETAMINOPHEN) Take 1 tablet by mouth three times a day as needed  #90 x 0   Entered and Authorized by:   Syliva Overman MD   Signed by:   Syliva Overman MD on 05/02/2010   Method used:   Printed then faxed to ...       [facility] (retail)       [address]       [place], [place]  [address]       Ph: [phone]       Fax: [phone]   RxID:   [id number]

## 2010-09-11 NOTE — Progress Notes (Signed)
[facility]   Imported By: Lind Guest 03/23/2010 15:41:46  _____________________________________________________________________  External Attachment:    Type:   Image     Comment:   External Document

## 2010-09-11 NOTE — Progress Notes (Signed)
Summary: MRI  Phone Note Call from Patient   Summary of Call: Patient was told to call back in 2 weeks if she was still having pain and numbness and she is so she needs an MRI. Initial call taken by: Everitt Amber LPN,  April 19, 2010 8:16 AM  Follow-up for Phone Call        pls refer pt for MRO of her thoracolumbar spine eval back pain radiaring to lower ext with weakness and numbness, it is also in the referrals box, pls let her know you are working on this Follow-up by: Syliva Overman MD,  April 20, 2010 5:49 AM  Additional Follow-up for Phone Call Additional follow up Details #1::        called pt and let her know that she would be scheduled for a MRI and that i would call her back with appt. pt aware Additional Follow-up by: Rudene Anda,  April 20, 2010 4:14 PM

## 2010-09-11 NOTE — Progress Notes (Signed)
Summary: medicine  Phone Note Call from Patient   Summary of Call: someone named jennifer called and left message her metformin is fine do not use glipzide.  Initial call taken by: Lind Guest,  March 28, 2010 11:14 AM  Follow-up for Phone Call        That was [name] [facility] Dr. Lodema Hong aware Follow-up by: Everitt Amber LPN,  March 28, 2010 11:29 AM

## 2010-09-11 NOTE — Letter (Signed)
Summary: [facility]   Imported By: Lind Guest 06/08/2010 09:42:42  _____________________________________________________________________  External Attachment:    Type:   Image     Comment:   External Document

## 2010-09-11 NOTE — Assessment & Plan Note (Signed)
Summary: F UP   Vital Signs:  Patient profile:   35 year old female Height:      63 inches Weight:      193 pounds BMI:     34.31 O2 Sat:      97 % Pulse rate:   96 / minute Pulse rhythm:   regular Resp:     16 per minute BP sitting:   120 / 80  (left arm) Cuff size:   large  Vitals Entered By: Everitt Amber LPN (December 05, 2009 2:25 PM)  Nutrition Counseling: Patient's BMI is greater than 25 and therefore counseled on weight management options. CC: Follow up chronic problems   Primary Care Provider:  Syliva Overman MD  CC:  Follow up chronic problems.  History of Present Illness: Reports  that she has been doing well. Denies recent fever or chills. Denies sinus pressure, nasal congestion , ear pain or sore throat. Denies chest congestion, or cough productive of sputum. Denies chest pain, palpitations, PND, orthopnea or leg swelling. Denies abdominal pain, nausea, vomitting, diarrhea or constipation. Denies change in bowel movements or bloody stool. Denies dysuria , frequency, incontinence or hesitancy. Denies  joint pain, swelling, or reduced mobility. Denies headaches, vertigo, seizures. Denies depression, anxiety or insomnia. Denies  rash, lesions, or itch.     Current Medications (verified): 1)  Glucometer Strips For One Touch Ultra Mini .... Use As Directed 2)  Lancets .... Use As Directed 3)  Propranolol Hcl 40 Mg Tabs (Propranolol Hcl) .Marland Kitchen.. 1 By Mouth Two Times A Day 4)  Lisinopril 20 Mg Tabs (Lisinopril) .... Take 1 Tablet By Mouth Once A Day 5)  Hydrochlorothiazide 25 Mg Tabs (Hydrochlorothiazide) .... Take 1 Tablet By Mouth Once A Day 6)  Onetouch Ultra Test  Strp (Glucose Blood) .... Once Daily Testing 7)  Janumet 50-1000 Mg Tabs (Sitagliptin-Metformin Hcl) .... Take 1 Tablet By Mouth Two Times A Day 8)  Lovastatin 10 Mg Tabs (Lovastatin) .... Take 1 Tab By Mouth At Bedtime 9)  Medroxyprogesterone Acetate 10 Mg Tabs (Medroxyprogesterone Acetate) .... Take  1 Tab By Mouth At Bedtime For 10 Days Every 2 Months  Allergies (verified): 1)  Amlodipine Besylate (Amlodipine Besylate)  Review of Systems      See HPI Eyes:  Denies blurring, discharge, and red eye. Endo:  Denies cold intolerance, excessive hunger, excessive thirst, excessive urination, heat intolerance, polyuria, and weight change; tests once daily and fastings are seldom over 120. Heme:  Denies abnormal bruising and bleeding. Allergy:  Complains of seasonal allergies; denies hives or rash and itching eyes.  Physical Exam  General:  Well-developed,well-nourished,in no acute distress; alert,appropriate and cooperative throughout examination HEENT: No facial asymmetry,  EOMI, No sinus tenderness, TM's Clear, oropharynx  pink and moist.   Chest: Clear to auscultation bilaterally.  CVS: S1, S2, No murmurs, No S3.   Abd: Soft, Nontender.  MS: Adequate ROM spine, hips, shoulders and knees.  Ext: No edema.   CNS: CN 2-12 intact, power tone and sensation normal throughout.   Skin: Intact, no visible lesions or rashes.  Psych: Good eye contact, normal affect.  Memory intact, not anxious or depressed appearing.   Diabetes Management Exam:    Foot Exam (with socks and/or shoes not present):       Sensory-Monofilament:          Left foot: normal          Right foot: normal       Inspection:  Left foot: normal          Right foot: normal       Nails:          Left foot: normal          Right foot: normal   Impression & Recommendations:  Problem # 1:  OBESITY (ICD-278.00) Assessment Improved  Ht: 63 (12/05/2009)   Wt: 193 (12/05/2009)   BMI: 34.31 (12/05/2009)  Problem # 2:  HYPERLIPIDEMIA (ICD-272.4) Assessment: Comment Only  Her updated medication list for this problem includes:    Lovastatin 10 Mg Tabs (Lovastatin) .Marland Kitchen... Take 1 tab by mouth at bedtime  Orders: T-Hepatic Function [id number]) T-Lipid Profile [id number])  Labs Reviewed: SGOT: 14  (08/14/2009)   SGPT: 19 (08/14/2009)   HDL:37 (08/14/2009), 44 (11/17/2008)  LDL:106 (08/14/2009), 106 (11/17/2008)  Chol:154 (08/14/2009), 161 (11/17/2008)  Trig:55 (08/14/2009), 56 (11/17/2008)  Problem # 3:  DIABETES MELLITUS, TYPE II, CONTROLLED, W/O COMPLICATIONS (ICD-250.00) Assessment: Comment Only  The following medications were removed from the medication list:    Metformin Hcl 500 Mg Tabs (Metformin hcl) .Marland Kitchen... Take 1 tablet by mouth two times a day Her updated medication list for this problem includes:    Lisinopril 20 Mg Tabs (Lisinopril) .Marland Kitchen... Take 1 tablet by mouth once a day    Janumet 50-1000 Mg Tabs (Sitagliptin-metformin hcl) .Marland Kitchen... Take 1 tablet by mouth two times a day  Orders: T- Hemoglobin A1C ([id number])  Labs Reviewed: Creat: 0.80 (08/14/2009)     Last Eye Exam: normal (10/25/2008) Reviewed HgBA1c results: 6.7 (08/14/2009)  7.2 (01/23/2009)  Problem # 4:  HYPERTENSION (ICD-401.9) Assessment: Unchanged  Her updated medication list for this problem includes:    Propranolol Hcl 40 Mg Tabs (Propranolol hcl) .Marland Kitchen... 1 by mouth two times a day    Lisinopril 20 Mg Tabs (Lisinopril) .Marland Kitchen... Take 1 tablet by mouth once a day    Hydrochlorothiazide 25 Mg Tabs (Hydrochlorothiazide) .Marland Kitchen... Take 1 tablet by mouth once a day  Orders: T-Basic Metabolic Panel [id number]) T-Basic Metabolic Panel [id number])  BP today: 120/80 Prior BP: 120/72 (10/23/2009)  Labs Reviewed: K+: 4.4 (08/14/2009) Creat: : 0.80 (08/14/2009)   Chol: 154 (08/14/2009)   HDL: 37 (08/14/2009)   LDL: 106 (08/14/2009)   TG: 55 (08/14/2009)  Complete Medication List: 1)  Glucometer Strips For One Touch Ultra Mini  .... Use as directed 2)  Lancets  .... Use as directed 3)  Propranolol Hcl 40 Mg Tabs (Propranolol hcl) .Marland Kitchen.. 1 by mouth two times a day 4)  Lisinopril 20 Mg Tabs (Lisinopril) .... Take 1 tablet by mouth once a day 5)  Hydrochlorothiazide 25 Mg Tabs (Hydrochlorothiazide) .... Take 1  tablet by mouth once a day 6)  Onetouch Ultra Test Strp (Glucose blood) .... Once daily testing 7)  Janumet 50-1000 Mg Tabs (Sitagliptin-metformin hcl) .... Take 1 tablet by mouth two times a day 8)  Lovastatin 10 Mg Tabs (Lovastatin) .... Take 1 tab by mouth at bedtime 9)  Medroxyprogesterone Acetate 10 Mg Tabs (Medroxyprogesterone acetate) .... Take 1 tab by mouth at bedtime for 10 days every 2 months  Other Orders: T-Vitamin D (25-Hydroxy) ([id number]) Tdap => 63yrs IM (03474) Admin 1st Vaccine (25956) Admin 1st Vaccine [facility]) [phone])  Patient Instructions: 1)  f/u in 3.5 months. 2)  It is important that you exercise regularly at least 20 minutes 5 times a week. If you develop chest pain, have severe difficulty breathing, or feel very tired , stop exercising immediately and seek medical attention. 3)  You need to lose weight. Consider a lower calorie diet and regular exercise.  4)  HbgA1C prior to visit, ICD-9: and chem7 today 5)  fasting lipid, hepatic , chem 7 hBA1C and vit d level in 3.5 months Prescriptions: LOVASTATIN 10 MG TABS (LOVASTATIN) Take 1 tab by mouth at bedtime  #90 x 4   Entered by:   Everitt Amber LPN   Authorized by:   Syliva Overman MD   Signed by:   Everitt Amber LPN on 16/05/9603   Method used:   Electronically to        [facility] (retail)       [address]       [place], [place]  [address]       Ph: [phone]       Fax: [phone]   RxID:   [id number]     Tetanus/Td Vaccine    Vaccine Type: Tdap    Site: left deltoid    Mfr: boosterix    Dose: 0.5 ml    Route: IM    Given by: Everitt Amber LPN    Exp. Date: 11/04/2011    Lot #: [id number]

## 2010-09-11 NOTE — Progress Notes (Signed)
Summary: dr. appt.   Phone Note Call from Patient   Summary of Call: pt has appt for dr. Franky Macho for 05/21/2010. pt notified  Initial call taken by: Rudene Anda,  May 03, 2010 4:17 PM

## 2010-09-11 NOTE — Letter (Signed)
Summary: order for outpatient nutritional care  order for outpatient nutritional care   Imported By: Lind Guest 03/30/2010 14:43:00  _____________________________________________________________________  External Attachment:    Type:   Image     Comment:   External Document

## 2010-09-11 NOTE — Letter (Signed)
Summary: to dr. Emelda Fear  to dr. Emelda Fear   Imported By: Lind Guest 03/30/2010 15:15:36  _____________________________________________________________________  External Attachment:    Type:   Image     Comment:   External Document

## 2010-09-11 NOTE — Assessment & Plan Note (Signed)
Summary: new patient   Vital Signs:  Patient profile:   35 year old female Height:      63 inches Weight:      199 pounds BMI:     35.38 O2 Sat:      98 % Pulse rate:   77 / minute Pulse rhythm:   regular Resp:     16 per minute BP sitting:   120 / 80  (left arm)  Vitals Entered By: Everitt Amber (August 14, 2009 8:59 AM)  Nutrition Counseling: Patient's BMI is greater than 25 and therefore counseled on weight management options. CC: New Patient Is Patient Diabetic? Yes   Primary Care Provider:  Syliva Overman MD  CC:  New Patient.  History of Present Illness: new p[t evaluation for for this young femalre with type 2 diabetes and hypertension , who is overweight. Reports  that she is generally doing well. Denies recent fever or chills. Denies sinus pressure, nasal congestion , ear pain or sore throat. Denies chest congestion, or cough productive of sputum. Denies chest pain, palpitations, PND, orthopnea or leg swelling. Denies abdominal pain, iarrhea or constipation. Denies change in bowel movements or bloody stool. Denies dysuria , frequency, incontinence or hesitancy. Denies  joint pain, swelling, or reduced mobility. Denies headaches, vertigo, seizures. Denies depression, anxiety or insomnia. Denies  rash, lesions, or itch.     Preventive Screening-Counseling & Management  Alcohol-Tobacco     Smoking Status: never  Current Medications (verified): 1)  Metformin Hcl 500 Mg Tabs (Metformin Hcl) .Marland Kitchen.. 1 By Mouth Two Times A Day 2)  Glucometer Strips For One Touch Ultra Mini .... Use As Directed 3)  Lancets .... Use As Directed 4)  Propranolol Hcl 40 Mg Tabs (Propranolol Hcl) .Marland Kitchen.. 1 By Mouth Two Times A Day 5)  Lisinopril 20 Mg Tabs (Lisinopril) .... Take 1 Tablet By Mouth Once A Day 6)  Hydrochlorothiazide 25 Mg Tabs (Hydrochlorothiazide) .... 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[employer], [employer]  Risk Factors: Exercise: yes (09/17/2006)  Risk Factors: Smoking Status: never (08/14/2009) Passive Smoke Exposure: no (09/17/2006)  Past Medical History: GERD Hypertension   since approx 2005 constipation NIDDM, no complications since approx 2005 mild B/L cataracts morbid obesity  Family History: Father-58-DM, HTN, kidney disease, esrd Mother-54-DM- HTN Brother-33, healthy  Social History: Married in 2007 Never Smoked Alcohol use-no Drug use-no Regular exercise-yes, 5 days per wek at least 30 mins one daughter born 01/26/2008, healthy Currently unemployed , 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 GI:  Complains of nausea and vomiting; denies constipation and diarrhea; intolerant of 1000mg  of metformin, was ok with 500mg  dose. GU:  Complains of abnormal vaginal bleeding; denies discharge, dysuria, urinary frequency, and urinary hesitancy; pt has been on 10 day bursts of provera to facilitate a 5 day bleed, she bleeds on avg every 6 weeks, ha salways had next to no menses, conceived succesfully with clomid within 2 months. MS:  Denies  joint pain and stiffness. Derm:  Complains of changes in nail beds; fungal nail infection for years. Endo:  pt notes that she has been having marked fluctuations in her blood sugar with poor control, particularly on the dyas she is on provera for amenorreah.  Physical Exam  General:   Well-developed,overweightno acute distress; alert,appropriate and cooperative throughout examination HEENT: No facial asymmetry,  EOMI, No sinus tenderness, TM's Clear, oropharynx  pink and moist.   Chest: Clear to auscultation bilaterally.  CVS: S1, S2, No murmurs, No S3.   Abd: Soft, Nontender.  MS: Adequate ROM spine, hips, shoulders and knees.  Ext: No edema.   CNS: CN 2-12 intact, power tone and sensation normal throughout.   Skin: Intact, no visible lesions or rashes. Fungal nail infection Psych: Good eye contact, normal affect.  Memory intact, not anxious or depressed appearing.   Diabetes Management Exam:    Foot Exam (with socks and/or shoes not present):       Sensory-Monofilament:          Left foot: normal          Right foot: normal       Inspection:          Left foot: normal          Right foot: normal       Nails:          Left foot: fungal infection          Right foot: fungal infection   Impression & Recommendations:  Problem # 1:  TINEA PEDIS (ICD-110.4) Assessment Comment Only  The following medications were removed from the medication list:    Nystatin 100000 Unit/gm Crea (Nystatin) .Marland Kitchen... Apply to affected area two times a day for 2 weeks    Fluconazole 150 Mg Tabs (Fluconazole) .Marland Kitchen... 1 by mouth once daily for 3 days pt electing to hold off on any treatment at this time  Problem # 2:  HYPERLIPIDEMIA (ICD-272.4) Assessment: Comment Only  Orders: T-Hepatic Function [id number]) T-Lipid Profile [phone])  Labs Reviewed: SGOT: 13 (11/17/2008)   SGPT: 16 (11/17/2008)   HDL:44 (11/17/2008), 45 (05/02/2008)  LDL:106 (11/17/2008), 117 (05/02/2008)  Chol:161 (11/17/2008), 183 (05/02/2008)  Trig:56 (11/17/2008), 105 (05/02/2008)low fat diet discussed and encouraged  Problem # 3:  HYPERTENSION (ICD-401.9) Assessment: Improved  Orders: T-Basic Metabolic Panel [phone])  Her updated medication list for this problem includes:    Propranolol Hcl 40  Mg Tabs (Propranolol hcl) .Marland Kitchen... 1 by mouth two times a day    Lisinopril 20 Mg Tabs (Lisinopril) .Marland Kitchen... Take 1 tablet by mouth once a day    Hydrochlorothiazide 25 Mg Tabs (Hydrochlorothiazide) .Marland Kitchen... Take 1 tablet by mouth once a day  Prior BP: 148/98 (01/23/2009) CURRENT bp 120/80 Labs Reviewed: K+: 4.2 (11/17/2008) Creat: : 0.75 (11/17/2008)   Chol: 161 (11/17/2008)   HDL: 44 (11/17/2008)   LDL: 106 (11/17/2008)   TG: 56 (11/17/2008)  Problem # 4:  DIABETES MELLITUS, TYPE II, CONTROLLED, W/O COMPLICATIONS (ICD-250.00) Assessment: Improved  The following medications were removed from the medication list:    Metformin Hcl 500 Mg Tabs (Metformin hcl) .Marland Kitchen... 1 by mouth two times a day Her updated medication list for this problem includes:    Lisinopril 20 Mg Tabs (Lisinopril) .Marland Kitchen... Take 1 tablet by mouth once a day    Janumet 50-500 Mg Tabs (Sitagliptin-metformin hcl) .Marland Kitchen... Take 1 tablet by mouth two times a day  Orders: T-Urine Microalbumin w/creat. ratio ([id number]) Glucose, (CBG) (82962) Hemoglobin A1C (  04540)  Labs Reviewed: Creat: 0.75 (11/17/2008)     Last Eye Exam: normal (10/25/2008) Reviewed HgBA1c results: 6.7 (08/14/2009)  7.2 (01/23/2009)  Problem # 5:  OBESITY (ICD-278.00) Assessment: Unchanged  Ht: 63 (08/14/2009)   Wt: 199 (08/14/2009)   BMI: 35.38 (08/14/2009)  Complete Medication List: 1)  Glucometer Strips For One Touch Ultra Mini  .... Use as directed 2)  Lancets  .... Use as directed 3)  Propranolol Hcl 40 Mg Tabs (Propranolol hcl) .Marland Kitchen.. 1 by mouth two times a day 4)  Lisinopril 20 Mg Tabs (Lisinopril) .... Take 1 tablet by mouth once a day 5)  Hydrochlorothiazide 25 Mg Tabs (Hydrochlorothiazide) .... Take 1 tablet by mouth once a day 6)  Janumet 50-500 Mg Tabs (Sitagliptin-metformin hcl) .... Take 1 tablet by mouth two times a day  Other Orders: T-CBC No Diff ([id number]) T-TSH ([id number]) Pneumococcal Vaccine (78469) Admin 1st Vaccine  [phone]) Admin 1st Vaccine [facility]) [phone])  Patient Instructions: 1)  Please schedule a follow-up appointment in 2 months. 2)  It is important that you exercise regularly at least 60 minutes 5 times a week. If you develop chest pain, have severe difficulty breathing, or feel very tired , stop exercising immediately and seek medical attention. 3)  You need to lose weight. Consider a lower calorie diet and regular exercise. Goal is 6 to 10 pounds 4)  New med for blood sugar, stop metformin. 5)  BP is great, no medchange 6)  Pneumovax today Prescriptions: HYDROCHLOROTHIAZIDE 25 MG TABS (HYDROCHLOROTHIAZIDE) Take 1 tablet by mouth once a day  #30 x 4   Entered by:   Everitt Amber   Authorized by:   Syliva Overman MD   Signed by:   Everitt Amber on 08/14/2009   Method used:   Electronically to        [facility] (retail)       [address]       [place], [place]  [address]       Ph: [phone]       Fax: [phone]   RxID:   [id number] LISINOPRIL 20 MG TABS (LISINOPRIL) Take 1 tablet by mouth once a day  #30 x 4   Entered by:   Everitt Amber   Authorized by:   Syliva Overman MD   Signed by:   Everitt Amber on 08/14/2009   Method used:   Electronically to        [facility] (retail)       [address]       [place], [place]  [address]       Ph: [phone]       Fax: [phone]   RxID:   [id number] PROPRANOLOL HCL 40 MG TABS (PROPRANOLOL HCL) 1 by mouth two times a day  #60 x 4   Entered by:   Everitt Amber   Authorized by:   Syliva Overman MD   Signed by:   Everitt Amber on 08/14/2009   Method used:   Electronically to        [facility] (retail)       [address]       [place], [place]  [address]       Ph: [phone]       Fax: [phone]   RxID:  [id number] JANUMET 50-500 MG TABS (SITAGLIPTIN-METFORMIN HCL) Take 1 tablet by mouth two times a day  #60 x 4   Entered and  Authorized by:   Syliva Overman MD   Signed by:   Syliva Overman MD on 08/14/2009   Method used:   Electronically to        [facility] (retail)       [address]       [place], [place]  [address]       Ph: [phone]       Fax: [phone]   RxID:   [id number]    Laboratory Results   Blood Tests   Date/Time Received: August 14, 2009  Date/Time Reported: August 14, 2009   Glucose (random): 169 mg/dL   (Normal Range: 74-259) HGBA1C: 6.7%   (Normal Range: Non-Diabetic - 3-6%   Control Diabetic - 6-8%)       Pneumovax    Vaccine Type: Pneumovax    Site: left deltoid    Mfr: Merck    Dose: 0.5 ml    Route: IM    Given by: Everitt Amber    Exp. Date: 12/11    Lot #: [id number]

## 2010-09-11 NOTE — Medication Information (Signed)
Summary: CHANGE OF MEDICINE  CHANGE OF MEDICINE   Imported By: Lind Guest 07/11/2010 10:42:51  _____________________________________________________________________  External Attachment:    Type:   Image     Comment:   External Document

## 2010-09-11 NOTE — Progress Notes (Signed)
Summary: BLOOD SUGARS  Phone Note Call from Patient   Summary of Call: BLOOD SUGARS ARE NOT COMING DOWN   CALL BACK AT [phone] Initial call taken by: Lind Guest,  October 18, 2009 1:55 PM  Follow-up for Phone Call        fasting blood sugars this week 199, 203, 200, 175, 158, 168 started janumet in [place] Follow-up by: Adella Hare LPN,  October 19, 2009 1:55 PM  Additional Follow-up for Phone Call Additional follow up Details #1::        pt needs to be evaluated in office per dr. Lodema Hong. Please schedule appointment when patient can come in (either [place] or [place]) Additional Follow-up by: Everitt Amber LPN,  October 19, 2009 3:41 PM    Additional Follow-up for Phone Call Additional follow up Details #2::    COMING IN [place] 3.14.11 Follow-up by: Lind Guest,  October 20, 2009 10:07 AM

## 2010-09-11 NOTE — Progress Notes (Signed)
  Phone Note Other Incoming   Caller: dr simpson Summary of Call: pls [name], I need to speaK WITH HER RE THIS PT, PLS GET ME TO THE PHONE  Initial call taken by: Syliva Overman MD,  April 06, 2010 3:04 AM  Follow-up for Phone Call        not there today will be back on monday Follow-up by: Lind Guest,  April 06, 2010 10:15 AM  Additional Follow-up for Phone Call Additional follow up Details #1::        noted , thanks pls keep on hold till i speak wih her Additional Follow-up by: Syliva Overman MD,  April 06, 2010 11:46 AM    Additional Follow-up for Phone Call Additional follow up Details #2::    YOU HAVE TALKED WITH HER TODAY Follow-up by: Lind Guest,  April 09, 2010 2:31 PM   Appended Document:  confirmed that glyburide ok in pregnancy, spoke directly with [name]

## 2010-09-11 NOTE — Progress Notes (Signed)
Summary: LAB RESULTS  Phone Note Call from Patient   Summary of Call: NEVER RECIEVED LAB RESULTS  CALL BACK AT [phone] WITH THIS Initial call taken by: Lind Guest,  October 16, 2009 8:46 AM  Follow-up for Phone Call        patient aware Follow-up by: Adella Hare LPN,  October 16, 2009 8:49 AM

## 2010-09-13 NOTE — Progress Notes (Signed)
Summary: please advise  Phone Note Call from Patient   Summary of Call: PATIENT would like Dr. Lodema Hong to know that her janumet needs to be increased to 50/1000 she is still having high readings in the mornings.  Please advise. Initial call taken by: Curtis Sites,  July 23, 2010 8:46 AM  Follow-up for Phone Call        dose increase entered historically, pls call pharmacy and fax in the script entered hisstorically, pls also let pt know  Follow-up by: Syliva Overman MD,  July 23, 2010 1:03 PM  Additional Follow-up for Phone Call Additional follow up Details #1::        called pharmacy and patient aware Additional Follow-up by: Adella Hare LPN,  July 23, 2010 1:20 PM    New/Updated Medications: JANUMET 50-1000 MG TABS (SITAGLIPTIN-METFORMIN HCL) Take 1 tablet by mouth two times a day Prescriptions: JANUMET 50-1000 MG TABS (SITAGLIPTIN-METFORMIN HCL) Take 1 tablet by mouth two times a day  #60 x 3   Entered by:   Adella Hare LPN   Authorized by:   Syliva Overman MD   Signed by:   Adella Hare LPN on 16/05/9603   Method used:   Electronically to        [facility] (retail)       [address]       [place], [place]  [address]       Ph: [phone]       Fax: [phone]   RxID:   [id number] JANUMET 50-1000 MG TABS (SITAGLIPTIN-METFORMIN HCL) Take 1 tablet by mouth two times a day  #60 x 3   Entered and Authorized by:   Syliva Overman MD   Signed by:   Syliva Overman MD on 07/23/2010   Method used:   Historical   RxID:   [id number]

## 2010-09-26 ENCOUNTER — Telehealth: Payer: Self-pay | Admitting: Family Medicine

## 2010-09-28 ENCOUNTER — Telehealth: Payer: Self-pay | Admitting: Family Medicine

## 2010-10-03 NOTE — Progress Notes (Signed)
Summary: nurse   Phone Note Call from Patient   Summary of Call: pt needs to speak with nurse about machine. [phone] Initial call taken by: Rudene Anda,  September 28, 2010 3:45 PM  Follow-up for Phone Call        Her old lancets wouldn't fit her new machine and it said on the box that it used delica lancets now so I sent those to [facility] for her Follow-up by: Everitt Amber LPN,  September 28, 2010 4:01 PM    New/Updated Medications: Dola Argyle LANCETS  MISC (LANCETS) once daily testing Prescriptions: ONETOUCH DELICA LANCETS  MISC (LANCETS) once daily testing  #90days x 3   Entered by:   Everitt Amber LPN   Authorized by:   Syliva Overman MD   Signed by:   Everitt Amber LPN on 86/57/8469   Method used:   Electronically to        [facility]* (retail)             ,          Ph: [phone]       Fax: [phone]   RxID:   [id number]

## 2010-10-03 NOTE — Progress Notes (Signed)
Summary: meter  Phone Note Call from Patient   Summary of Call: her one touch ultra machine has broke do you have another oone she can pick up? Call back at [phone] Initial call taken by: Lind Guest,  September 26, 2010 2:49 PM  Follow-up for Phone Call        patient coming to pick up Follow-up by: Everitt Amber LPN,  September 26, 2010 2:58 PM

## 2010-10-24 LAB — CONVERTED CEMR LAB
BUN: 11 mg/dL (ref 6–23)
CO2: 29 meq/L (ref 19–32)
Calcium: 9.4 mg/dL (ref 8.4–10.5)
Chloride: 99 meq/L (ref 96–112)
Hgb A1c MFr Bld: 6.7 % — ABNORMAL HIGH (ref <5.7)
Potassium: 3.9 meq/L (ref 3.5–5.3)
Sodium: 139 meq/L (ref 135–145)

## 2010-10-29 ENCOUNTER — Ambulatory Visit (INDEPENDENT_AMBULATORY_CARE_PROVIDER_SITE_OTHER): Payer: BC Managed Care – PPO | Admitting: Family Medicine

## 2010-10-29 ENCOUNTER — Encounter: Payer: Self-pay | Admitting: Family Medicine

## 2010-10-29 DIAGNOSIS — B351 Tinea unguium: Secondary | ICD-10-CM | POA: Insufficient documentation

## 2010-10-29 DIAGNOSIS — B353 Tinea pedis: Secondary | ICD-10-CM

## 2010-10-29 DIAGNOSIS — I10 Essential (primary) hypertension: Secondary | ICD-10-CM

## 2010-10-29 DIAGNOSIS — E785 Hyperlipidemia, unspecified: Secondary | ICD-10-CM

## 2010-10-30 ENCOUNTER — Encounter: Payer: Self-pay | Admitting: Family Medicine

## 2010-11-08 NOTE — Letter (Signed)
Summary: nutritional care  nutritional care   Imported By: Lind Guest 10/30/2010 15:29:09  _____________________________________________________________________  External Attachment:    Type:   Image     Comment:   External Document

## 2010-11-08 NOTE — Assessment & Plan Note (Signed)
Summary: f up   Vital Signs:  Patient profile:   35 year old female Height:      63 inches Weight:      199.50 pounds BMI:     35.47 O2 Sat:      96 % on Room air Pulse rate:   78 / minute Pulse rhythm:   regular Resp:     16 per minute BP sitting:   122 / 82  (left arm) Cuff size:   large  Vitals Entered By: Everitt Amber LPN (October 29, 2010 8:54 AM)  Nutrition Counseling: Patient's BMI is greater than 25 and therefore counseled on weight management options.  O2 Flow:  Room air CC: Follow up chronic problems   Primary Care Provider:  Syliva Overman MD  CC:  Follow up chronic problems.  History of Present Illness: Reports  that they are doing well. Denies recent fever or chills. Denies sinus pressure, nasal congestion , ear pain or sore throat. Denies chest congestion, or cough productive of sputum. Denies chest pain, palpitations, PND, orthopnea or leg swelling. Denies abdominal pain, nausea, vomitting, diarrhea or constipation. Denies change in bowel movements or bloody stool. Denies dysuria , frequency, incontinence or hesitancy. Denies  joint pain, swelling, or reduced mobility. Denies headaches, vertigo, seizures. Denies depression, anxiety or insomnia.    Current Medications (verified): 1)  Propranolol Hcl 40 Mg Tabs (Propranolol Hcl) .Marland Kitchen.. 1 By Mouth Two Times A Day 2)  Hydrochlorothiazide 25 Mg Tabs (Hydrochlorothiazide) .... Take 1 Tablet By Mouth Once A Day 3)  Onetouch Ultra Test  Strp (Glucose Blood) .... Once Daily Testing 4)  Medroxyprogesterone Acetate 10 Mg Tabs (Medroxyprogesterone Acetate) .... Take 1 Tab By Mouth At Bedtime For 10 Days Every 2 Months 5)  Glyburide 2.5 Mg Tabs (Glyburide) .... Take 1 Tablet By Mouth Two Times A Day 6)  Onetouch Delica Lancets  Misc (Lancets) .... Once Daily Testing 7)  Janumet 50-1000 Mg Tabs (Sitagliptin-Metformin Hcl) .... Take 1 Tablet By Mouth Two Times A Day  Allergies (verified): 1)  Amlodipine Besylate  (Amlodipine Besylate)  Review of Systems      See HPI General:  Denies fatigue. Eyes:  Denies blurring and discharge. Derm:  Complains of itching and rash; puritic rash on left forearm and arm intermittently x 2 months, red, blistering then dries up hyperpigmented, other family members affected. Endo:  Denies cold intolerance, excessive hunger, excessive thirst, excessive urination, and heat intolerance; once daily testing, mornings, 90 to 105, post lunch/dinner 150. Heme:  Denies abnormal bruising, bleeding, and enlarge lymph nodes. Allergy:  Denies hives or rash, itching eyes, persistent infections, and seasonal allergies.  Physical Exam  General:  Well-developed,well-nourished,in no acute distress; alert,appropriate and cooperative throughout examination HEENT: No facial asymmetry,  EOMI, No sinus tenderness, TM's Clear, oropharynx  pink and moist.   Chest: Clear to auscultation bilaterally.  CVS: S1, S2, No murmurs, No S3.   Abd: Soft, Nontender.  MS: Adequate ROM spine, hips, shoulders and knees.  Ext: No edema.   CNS: CN 2-12 intact, power tone and sensation normal throughout.   Skin: hyperemic raised rash with scaly border on left upper arm, hyperpigmented flat lesions on forearm x2 where previous lesions have healed  Psych: Good eye contact, normal affect.  Memory intact, not anxious or depressed appearing.   Diabetes Management Exam:    Foot Exam (with socks and/or shoes not present):       Sensory-Monofilament:  Left foot: normal          Right foot: normal       Inspection:          Left foot: abnormal             Comments: tinea pedis          Right foot: abnormal             Comments: tinea pedis       Nails:          Left foot: fungal infection          Right foot: fungal infection   Impression & Recommendations:  Problem # 1:  BACK PAIN WITH RADICULOPATHY (ICD-729.2) Assessment Improved  Problem # 2:  OBESITY (ICD-278.00) Assessment:  Unchanged  Ht: 63 (10/29/2010)   Wt: 199.50 (10/29/2010)   BMI: 35.47 (10/29/2010) therapeutic lifestyle change discussed and encouraged  Problem # 3:  TINEA PEDIS (ICD-110.4) Assessment: Deteriorated  Her updated medication list for this problem includes:    Terbinafine Hcl 1 % Crea (Terbinafine hcl) .Marland Kitchen... Apply twice daily to rash on left arm for 10 days , then as needed    Terbinafine Hcl 250 Mg Tabs (Terbinafine hcl) .Marland Kitchen... Take 1 tablet by mouth once a day  Problem # 4:  ONYCHOMYCOSIS, BILATERAL (ICD-110.1) Assessment: Comment Only  Her updated medication list for this problem includes:    Terbinafine Hcl 1 % Crea (Terbinafine hcl) .Marland Kitchen... Apply twice daily to rash on left arm for 10 days , then as needed    Terbinafine Hcl 250 Mg Tabs (Terbinafine hcl) .Marland Kitchen... Take 1 tablet by mouth once a day  Problem # 5:  DIABETES MELLITUS, TYPE II, CONTROLLED, W/O COMPLICATIONS (ICD-250.00) Assessment: Improved  Her updated medication list for this problem includes:    Glyburide 2.5 Mg Tabs (Glyburide) .Marland Kitchen... Take 1 tablet by mouth two times a day    Janumet 50-1000 Mg Tabs (Sitagliptin-metformin hcl) .Marland Kitchen... Take 1 tablet by mouth two times a day  Labs Reviewed: Creat: 0.81 (10/24/2010)     Last Eye Exam: normal (10/25/2008) Reviewed HgBA1c results: 6.7 (10/24/2010)  7.1 (07/11/2010)  Complete Medication List: 1)  Propranolol Hcl 40 Mg Tabs (Propranolol hcl) .Marland Kitchen.. 1 by mouth two times a day 2)  Hydrochlorothiazide 25 Mg Tabs (Hydrochlorothiazide) .... Take 1 tablet by mouth once a day 3)  Onetouch Ultra Test Strp (Glucose blood) .... Once daily testing 4)  Medroxyprogesterone Acetate 10 Mg Tabs (Medroxyprogesterone acetate) .... Take 1 tab by mouth at bedtime for 10 days every 2 months 5)  Glyburide 2.5 Mg Tabs (Glyburide) .... Take 1 tablet by mouth two times a day 6)  Onetouch Delica Lancets Misc (Lancets) .... Once daily testing 7)  Janumet 50-1000 Mg Tabs (Sitagliptin-metformin hcl) ....  Take 1 tablet by mouth two times a day 8)  Terbinafine Hcl 1 % Crea (Terbinafine hcl) .... Apply twice daily to rash on left arm for 10 days , then as needed 9)  Vitamin D (ergocalciferol) 50000 Unit Caps (Ergocalciferol) .... One capsule once weekly 10)  Terbinafine Hcl 250 Mg Tabs (Terbinafine hcl) .... Take 1 tablet by mouth once a day  Other Orders: T-Basic Metabolic Panel [id number]) T-Lipid Profile [id number]) T-CBC w/Diff [id number]) T- Hemoglobin A1C [phone]) T-TSH [id number]) T-Vitamin D (25-Hydroxy) [id number]) T-Urine Microalbumin w/creat. ratio [phone])  Patient Instructions: 1)  Please schedule a follow-up appointment in 4.5 months. 2)  It is important that you exercise regularly at least 40 to 45  minutes 6 times a week. If you develop chest pain, have severe difficulty breathing, or feel very tired , stop exercising immediately and seek medical attention. 3)  You need to lose weight. Consider a lower calorie diet and regular exercise. Goal is 5 pounds. 4)  We will provide  with a 1500 cal diet sheet also refer you to the nutritionist at the hospital, she will call. 5)  Med is sent in for vit D deficiency, also for the rash. pls call if it persits.You will also get tablets for fungal toenail nfection 6)  BMP prior to visit, ICD-9: eGFR and hepatic 7)  Lipid Panel prior to visit, ICD-9: 8)  TSH prior to visit, ICD-9:   fasting in 4.5 months 9)  CBC w/ Diff prior to visit, ICD-9: 10)  HbgA1C prior to visit, ICD-9: 11)  Vit D 12)  Microalb today Prescriptions: TERBINAFINE HCL 250 MG TABS (TERBINAFINE HCL) Take 1 tablet by mouth once a day  #90 x 0   Entered and Authorized by:   Syliva Overman MD   Signed by:   Syliva Overman MD on 10/29/2010   Method used:   Electronically to        [facility] (retail)       [address]       [place], [place]  [address]       Ph: [phone]       Fax: [phone]   RxID:    [id number] VITAMIN D (ERGOCALCIFEROL) 50000 UNIT CAPS (ERGOCALCIFEROL) one capsule once weekly  #12 x 1   Entered and Authorized by:   Syliva Overman MD   Signed by:   Syliva Overman MD on 10/29/2010   Method used:   Electronically to        [facility] (retail)       [address]       [place], [place]  [address]       Ph: [phone]       Fax: [phone]   RxID:   [id number] TERBINAFINE HCL 1 % CREA (TERBINAFINE HCL) apply twice daily to rash on left arm for 10 days , then as needed  #45 gm x 2   Entered and Authorized by:   Syliva Overman MD   Signed by:   Syliva Overman MD on 10/29/2010   Method used:   Electronically to        [facility] (retail)       [address]       [place], [place]  [address]       Ph: [phone]       Fax: [phone]   RxID:   [id number]    Orders Added: 1)  Est. Patient Level IV [99214] 2)  T-Basic Metabolic Panel [id number] 3)  T-Lipid Profile [[id number]] 4)  T-CBC w/Diff [[id number]] 5)  T- Hemoglobin A1C [83036-23375] 6)  T-TSH [[id number]] 7)  T-Vitamin D (25-Hydroxy) [[id number]] 8)  T-Urine Microalbumin w/creat. ratio [[id number]]

## 2010-12-25 ENCOUNTER — Other Ambulatory Visit: Payer: Self-pay | Admitting: Family Medicine

## 2010-12-25 NOTE — H&P (Signed)
[name], [name]          ACCOUNT NO.:  [id number]   MEDICAL RECORD NO.:  [id number]          PATIENT TYPE:  INP   LOCATION:  9101                          FACILITY:  [facility]   PHYSICIAN:  Crist Fat. Rivard, M.D. DATE OF BIRTH:  07/20/1976   DATE OF ADMISSION:  01/25/2008  DATE OF DISCHARGE:                              HISTORY & PHYSICAL   This is a 35 year old gravida 2, para 0-0-1-0 at 38-0/7 weeks who  presents for monitoring status post deceleration at the office.  Blood  pressures were 150/72 and 88 in the office.  Dr. Su Hilt ordered [facility]  labs and urinalysis in 2 hours of monitoring. Pregnancy is being  followed by Dr. Estanislado Pandy and remarkable for  1. Chronic hypertension.  2. Type 2 diabetes.  3. Irregular menses.  4. Elevated BMI.  5. Infertility.  6. Group B strep negative.   ALLERGIES:  Unknown type of blood pressure medicine causes swelling.   OB HISTORY:  Spontaneous abortion in 2008.   MEDICAL HISTORY:  1. Infertility.  2. Childhood varicella.  3. She also has a history of type 2 diabetes and  4. Chronic hypertension.   SURGICAL HISTORY:  Negative.   FAMILY HISTORY:  Mother and father with hypertension, grandmother with  aneurysm, mother and father with diabetes, father with renal failure,  two uncles with lung cancer.   GENETIC HISTORY:  Negative.   SOCIAL HISTORY:  The patient is married to [employer] who is  involved and supportive.  She is wholly in her faith.  She denies any  alcohol, tobacco or drug use.   PRENATAL LABS:  Hemoglobin 11.6, platelets 227, blood type O+, antibody  screen negative, sickle cell negative, RPR nonreactive, rubella immune.  Hepatitis negative.  Gonorrhea negative, chlamydia negative.  Pap test  normal.  History of current pregnancy.  The patient entered care at [redacted]  weeks gestation.  She was ordered to stay on her labetalol and  metformin..  She had a 7-week ultrasound which was normal and confirmed  dates.  Ultrasound  for nuchal translucency was normal.  Anatomy  ultrasound at 18 weeks was normal.  Blood sugars were well controlled  throughout pregnancy.  Ultrasound at 28 weeks was normal with elevated  AFI of 23 and normal growth.  Ultrasound at 32 weeks showed normal  growth with a BPP of 8/8.  She had mostly normal NSTs throughout late  pregnancy with occasional nonreactive NSTs but prolonged monitoring  after those were normal.  Ultrasound at 35 weeks showed 61 percentile  growth with 35 percentile fluid and 8/8 BPP.  AFI at 37 weeks was also  normal and she presents today.   OBJECTIVE DATA:  VITAL SIGNS:  Stable, afebrile.  HEENT:  Within normal limits.  Thyroid normal, not enlarged.  CHEST:  Clear to auscultation.  HEART:  Regular rate and rhythm.  ABDOMEN:  Gravid, vertex [place] exam shows reactive fetal heart rate  except for two spontaneous variable decelerations to 90-120 x90 seconds  each.  There are occasional contractions and most of the contractions do  not have decelerations with them.  Cervix is fingertip  to 175%, -3  station with a vertex presentation.  Urinalysis shows negative protein.  Platelet count is 157.  Chemistries are all within normal limits with an  AST of 17, ALT of 10, uric acid 3.3.  EXTREMITIES:  Show 1+ pedal edema with DTRs of 2+, no clonus.   ASSESSMENT:  1. Intrauterine pregnancy at 38-0/7 weeks.  2. Chronic hypertension.  3. Type 2 diabetes.  4. Variable decelerations, sporadic.   PLAN:  1. Admit to birthing suites per Dr. Estanislado Pandy.  2. Cervidil ripening of cervix.  3. CBG is every 4 hours.  4. Other routine L&D orders.      [name], C.N.M.      Crist Fat Rivard, M.D.  Electronically Signed    MLW/MEDQ  D:  01/25/2008  T:  01/26/2008  Job:  [id number]

## 2010-12-25 NOTE — Op Note (Signed)
[name], [name]          ACCOUNT NO.:  [id number]   MEDICAL RECORD NO.:  [id number]          PATIENT TYPE:  INP   LOCATION:  9101                          FACILITY:  [facility]   PHYSICIAN:  Osborn Coho, M.D.   DATE OF BIRTH:  1975-11-19   DATE OF PROCEDURE:  01/26/2008  DATE OF DISCHARGE:                               OPERATIVE REPORT   PREOPERATIVE DIAGNOSES:  1. 38+ weeks.  2. Type 2 diabetes.  3. Chronic hypertension.  4. Nonreassuring fetal heart tracing.   POSTOPERATIVE DIAGNOSIS:  1. 38+ weeks.  2. Type 2 diabetes.  3. Chronic hypertension.  4. Nonreassuring fetal heart tracing.   PROCEDURE:  Primary low-transverse cesarean section.   ATTENDING PHYSICIAN:  Osborn Coho, MD   ASSISTANT:  Renaldo Reel. Emilee Hero, C.N.M.   ANESTHESIA:  Spinal.   FINDINGS:  Live female infant with Apgars of 8 and 1 and 9 at 5 minutes  weighing 4 pounds and 13 ounces.   SPECIMENS TO PATHOLOGY:  Placenta.   FLUIDS:  2800 mL   URINE OUTPUT:  200 mL   ESTIMATED BLOOD LOSS:  700 mL   COMPLICATIONS:  None.   PROCEDURE:  The patient was taken to the operating room after risks,  benefits, alternatives discussed with the patient.  The patient  verbalized understanding, consent signed and witnessed.  The patient was  given a spinal per anesthesia and prepped and draped in normal sterile  fashion.  A Pfannenstiel skin incision was made and carried down to the  underlying layer of fascia.  A total of 20 mL of 1% lidocaine was  administered at the incision site, secondary to the patient having some  discomfort.  The patient no longer had any discomfort and procedure  continued with excision of the subcutaneous tissue and excision of the  fascia bilaterally.  The fascia was then extended bilaterally with the  Mayo scissors.  Kocher clamps were placed on the superior aspect of the  fascial incision and the rectus muscle excised from the fascia.  The  same was done on the inferior aspect of  the fascial incision.  The  rectus muscle separated in midline and the peritoneum entered bluntly  and extended manually.  The bladder blade was placed and bladder flap  created with the Metzenbaum scissors.  The uterine incision was made  with a scalpel, and extended bilaterally with the bandage scissors.  Rupture of membranes were performed and meconium staining was noted over  the amniotic fluid.  The infant was delivered in the vertex presentation  and occult cord noted that was also partially a nuchal cord.  The  oropharynx and nasopharynx were bulb suctioned and the cord clamped and  cut and the infant handed to the waiting pediatricians.  Cefoxitin had  been administered at the start of the case.  The placenta was removed  via fundal massage and the uterus cleared of all clots and debris.  The  uterine incision was repaired with 0 Vicryl via a running interlocking  stitch and a second imbricating layer was performed.  Two figure-of-  eight stitches were placed for good hemostasis.  The intraabdominal  cavity was copiously irrigated and bilateral ovaries and fallopian tubes  appeared to be within normal limits.  There was a small amount of oozing  at the left ankle of the incision and Surgicel was placed to assure  hemostasis.  Otherwise the uterine incision was noted to be completely  hemostatic.  The peritoneum was repaired with 2-0 chromic via a running  stitch.  The fascia was repaired via 0 Vicryl via running stitch.  The  subcutaneous tissue was reapproximated using four interrupted stitches  of 2-0 plain and the skin was reapproximated using 3-0 Monocryl via a  subcuticular stitch.  Half-inch Steri-Strips were applied with Benzoin  and a pressure dressing applied as well.  Sponge, lap and needle count  was correct.  The patient tolerated procedure well and was awaiting  transfer to the recovery room in good condition.      Osborn Coho, M.D.  Electronically  Signed     AR/MEDQ  D:  01/26/2008  T:  01/27/2008  Job:  [id number]

## 2010-12-28 NOTE — Discharge Summary (Signed)
NAMELINNET, BOTTARI          ACCOUNT NO.:  [id number]   MEDICAL RECORD NO.:  [id number]          PATIENT TYPE:  INP   LOCATION:  9101                          FACILITY:  [facility]   PHYSICIAN:  Osborn Coho, M.D.   DATE OF BIRTH:  11-24-1975   DATE OF ADMISSION:  01/25/2008  DATE OF DISCHARGE:  01/29/2008                               DISCHARGE SUMMARY   ADMITTING DIAGNOSES:  1. Intrauterine pregnancy at 38-0/7 weeks.  2. Chronic hypertension.  3. Type 2 diabetes.  4. Variable decelerations, sporadic.   DISCHARGE DIAGNOSES:  1. Intrauterine pregnancy at term, status post a primary low      transverse cesarean section for nonreassuring fetal heart tracing.      Findings:  A viable female infant by the name of Angelique Blonder on January 26, 2008, at 12:54 with Apgars of 8 and 9 and weight 4 pounds 14      ounces.  2. Chronic hypertension.  3. Type 2 diabetes.  4. Anemia without hemodynamic instability.  5. Bottle feeding.   PROCEDURES:  1. Spinal anesthesia.  2. Primary low transverse cesarean section for nonreassuring fetal      heart tracing.   HOSPITAL COURSE:  Ms. Poncedeleon is a 35 year old gravida 2, para 0-0-1-0  who presented on the date of admission at 38 weeks for additional  monitoring status post a deceleration in the office.  Blood pressures at  the office that day were 150/72 and 150/88.  Dr. Su Hilt had ordered [facility]  labs and urinalysis with an additional 2 hours of monitoring when she  sent her over.  Her pregnancy had been followed by MD Service with Dr.  Estanislado Pandy being her primary, and her history was remarkable for:  1. Chronic hypertension.  2. Type 2 diabetes.  3. Irregular menses.  4. Elevated BMI.  5. Infertility.  6. Group beta strep negative.   After coming over, urinalysis was negative for protein.  Platelet count  was 157.  Chemistries were all within normal limits.  Uric acid was 3.3.  She did have some pedal edema, 1+.  DTRs were 2+ and no clonus.  She  was  having occasional contractions.  Cervix was fingertip, 75% and -3 vertex  presentation.  Fetal heart tracing showed reactive fetal heart rate with  the exception of 2 spontaneous variable decelerations into the 90s and  120s lasting about 90 seconds each.  After her monitoring and such, it  was recommended per Dr. Estanislado Pandy to proceed with induction of labor, and  she was admitted to the birthing suite per Dr. Estanislado Pandy to obtain Cervidil  for cervical ripening.  She was to get CBGs every 4 hours, and she had  routine L&D orders.  By the morning of the 16th, Dr. Su Hilt assessed  the patient.  She was without complaints.  She had no PIH signs or  symptoms.  Her blood pressure was stable at 135/83.  CBGs were running  in the low 100s.  She was still fingertip and long and high.  Fetal  heart rate was in the 140s.  She had some small  accels, some mild  variables, good variability.  She was contracting every 3 to 8 minutes  approximately.  The plan was made to begin Pitocin that morning.  Pitocin then later on in the day only got to 4 milliunits, and fetal  heart tracing was showing recurrent decels, questionable rates.  She had  position change and Pitocin was held.  The decels persisted but were  less severe.  The  cervix continued to be fingertip, and Dr. Su Hilt  discussed with the patient and father of the baby options of trying to  proceed with the induction versus cesarean section secondary to  nonreassuring fetal heart tracing, and the patient did elect to proceed  with a primary low transverse cesarean section for nonreassuring fetal  heart tracing.  The patient was prepped and taken to the OR, where a  primary low transverse cesarean section was performed by Dr. Osborn Coho,  assisted by Nigel Bridgeman, secondary to nonreassuring fetal  heart tracing.  The procedure was completed under spinal anesthesia.  Findings were a live female infant by the name of Angelique Blonder at 12:54 on  June  16.  Her weight was 4 pounds 14 ounces, which was 2210 grams.  Length was 17.5 inches.  Apgars were 8 and 9.  Estimated blood loss was  700 mL.  The patient tolerated the procedure well and was taken to PACU  in good condition.  Newborn female was taken to full-term nursery.  By  postoperative day #1, the patient was doing well.  She had planned to  bottle feed.  Her vital signs were stable.  Her a.m. blood pressure was  139/82.  Fasting blood sugar was 100.  Her 2-hour postprandial CBG at  7:30 p.m. was 105, at 10:50 was 184.  Hemoglobin was down to 8.6 from  10.  White count was 10.3, which was just slightly up from 10.2.  Platelets were 145, which was down from 157, and her physical exam was  within normal limits.  Abdomen was soft, appropriately tender.  Her  dressing was clean, dry and intact at her incision site.  Fundus was  firm.  She had scant lochia.  Extremities were within normal limits.  In  light of the postpartum anemia, she had orthostatic vital signs which  were obtained.  She was started on iron b.i.d.  She was restarted on her  metformin 500 p.o. daily, and she was also for her chronic hypertension  on her labetalol 200 p.o. b.i.d.  By postoperative day #2, she was doing  well.  She had been voiding without difficulty since Foley DC on the  previous day.  She was ambulating in the room on arrival.  She was up ad  lib.  No dizziness or syncope to report.  Continued bottle feeding.  She  was tolerating a regular diet.  Pain was well controlled with Motrin and  occasional 1 tablet of Percocet p.r.n.  She reported positive  flatulence.  She was without complaints or questions.  Her vital signs  were stable and she was afebrile.  The a.m. blood pressure was 126/66.  Her CBGs for the previous 24 hours:  In the a.m. of June 18 at 5:50, her  fasting was 112, and then going back to the previous day on June 17 at  1945 it was 159, at 1417 was 163, at 10:15 a.m. it was 122, and  her  fasting the previous day on the 17th at 6 o'clock was 100.  Her physical  exam remained within normal limits.  Steri-Strips were intact with some  old dried dark red drainage.  Fundus was firm, U minus one, and she had  bowel sounds present x4 quadrants.  Lochia remained scant and rubra.  She did have 1 to 2+ pitting edema in her lower extremities but negative  Homan's.  She did have 1+ clonus in bilateral lower extremities.  Dr.  Estanislado Pandy ordered that day to increase her metformin from 500 daily to  b.i.d., continued the CBGs 4 times a day fasting and 2 hours  postprandial and continued to observe her blood sugars and her blood  pressures.  On June 19, it was postoperative day #3.  The patient was  well.  She was ready for discharge.  She had been up ad lib.  She was  still without dizziness or syncope.  Lochia remained scant, and she was  undecided regarding contraception for the future.  Vital signs remained  stable and she was afebrile.  Blood pressure on the morning of  postoperative #3 was 141/72.  The BP range was 122 to 141 systolic over  diastolics of 66 to 76.  Her CBGs for the previous 24 hours had been the  morning of postoperative day #3 fasting was 101, at 1900 the night  before it was 127, at 1400 the day before 134, at 10 o'clock a.m. on the  18th it was 140, and her fasting the previous day had been 112.  Physical exam remained within normal limits.  Fundus was still firm.  Scant lochia.  Her lower extremity pitting edema remained.  The patient  was deemed to have received full benefit of her hospital stay and was  discharged home in stable condition.   DISCHARGE FOLLOWUP:  She was scheduled for followup on July 7 at 10:15  a.m. with Dr. Estanislado Pandy.  She was to continue doing fasting blood sugars  every day and to pick one meal each day to do a 2-hour postprandial and  was encouraged to alternate those meals every day.   DISCHARGE MEDICATIONS:  1. Metformin 500 mg p.o.  b.i.d.  She is to take a tablet with      breakfast and a tablet with supper.  2. Nu-Iron 150 mg p.o. b.i.d. 1 tablet in the morning with breakfast,      1 tablet in the evening with supper.  3. Motrin 600 mg 1 tablet p.o. q.6 h p.r.n. pain.  4. Percocet 5/325 one to two tablets p.o. q.4-6 h p.r.n. moderate-to-      severe pain.  5. Labetalol 200 mg p.o. b.i.d.   She did receive prescriptions for all five of those medications.  She  was also instructed to continue [place] vitamin 2 tablets daily, and  she is also to obtain a stool softener 1 tablet in a.m., 1 tablet in  p.m. when she picked up her prescriptions.  Discharge instructions were  per [facility] pamphlet.  Warning signs and symptoms to report were reviewed,  and otherwise the patient was to follow up p.r.n.      Candice Genoa, [place]      Osborn Coho, M.D.  Electronically Signed    CHS/MEDQ  D:  01/30/2008  T:  01/30/2008  Job:  [id number]

## 2011-01-25 ENCOUNTER — Other Ambulatory Visit: Payer: Self-pay | Admitting: Family Medicine

## 2011-02-11 ENCOUNTER — Telehealth: Payer: Self-pay | Admitting: Family Medicine

## 2011-02-11 MED ORDER — PROPRANOLOL HCL 40 MG PO TABS: 40.0000 mg | ORAL_TABLET | Freq: Two times a day (BID) | ORAL | Status: DC

## 2011-02-11 MED ORDER — HYDROCHLOROTHIAZIDE 25 MG PO TABS: 25.0000 mg | ORAL_TABLET | Freq: Every day | ORAL | Status: DC

## 2011-02-11 NOTE — Telephone Encounter (Signed)
meds sent as requested 

## 2011-03-05 ENCOUNTER — Encounter: Payer: Self-pay | Admitting: Family Medicine

## 2011-03-06 ENCOUNTER — Other Ambulatory Visit: Payer: Self-pay | Admitting: Family Medicine

## 2011-03-06 LAB — TSH: TSH: 0.633 u[IU]/mL (ref 0.350–4.500)

## 2011-03-06 LAB — CBC WITH DIFFERENTIAL/PLATELET
Basophils Relative: 0 % (ref 0–1)
Eosinophils Absolute: 0.2 10*3/uL (ref 0.0–0.7)
Eosinophils Relative: 2 % (ref 0–5)
MCH: 25.1 pg — ABNORMAL LOW (ref 26.0–34.0)
MCHC: 30.4 g/dL (ref 30.0–36.0)
MCV: 82.7 fL (ref 78.0–100.0)
Monocytes Relative: 7 % (ref 3–12)
Neutrophils Relative %: 67 % (ref 43–77)
Platelets: 239 10*3/uL (ref 150–400)

## 2011-03-06 LAB — LIPID PANEL
HDL: 37 mg/dL — ABNORMAL LOW (ref >39)
Triglycerides: 89 mg/dL (ref <150)

## 2011-03-07 LAB — COMPLETE METABOLIC PANEL WITH GFR
ALT: 28 U/L (ref 0–35)
Albumin: 3.9 g/dL (ref 3.5–5.2)
CO2: 29 mEq/L (ref 19–32)
Calcium: 9.5 mg/dL (ref 8.4–10.5)
Chloride: 98 mEq/L (ref 96–112)
GFR, Est African American: >60 mL/min (ref >60)
Potassium: 3.8 mEq/L (ref 3.5–5.3)
Sodium: 139 mEq/L (ref 135–145)
Total Protein: 6.8 g/dL (ref 6.0–8.3)

## 2011-03-07 LAB — HEMOGLOBIN A1C: Mean Plasma Glucose: 143 mg/dL — ABNORMAL HIGH (ref <117)

## 2011-03-07 LAB — VITAMIN D 25 HYDROXY (VIT D DEFICIENCY, FRACTURES): Vit D, 25-Hydroxy: 35 ng/mL (ref 30–89)

## 2011-03-08 ENCOUNTER — Encounter: Payer: Self-pay | Admitting: Family Medicine

## 2011-03-11 ENCOUNTER — Ambulatory Visit (INDEPENDENT_AMBULATORY_CARE_PROVIDER_SITE_OTHER): Payer: BC Managed Care – PPO | Admitting: Family Medicine

## 2011-03-11 ENCOUNTER — Encounter: Payer: Self-pay | Admitting: Family Medicine

## 2011-03-11 VITALS — BP 114/82 | HR 84 | Resp 16 | Ht 62.0 in | Wt 193.1 lb

## 2011-03-11 DIAGNOSIS — IMO0002 Reserved for concepts with insufficient information to code with codable children: Secondary | ICD-10-CM

## 2011-03-11 DIAGNOSIS — L600 Ingrowing nail: Secondary | ICD-10-CM

## 2011-03-11 DIAGNOSIS — I10 Essential (primary) hypertension: Secondary | ICD-10-CM

## 2011-03-11 DIAGNOSIS — E669 Obesity, unspecified: Secondary | ICD-10-CM

## 2011-03-11 DIAGNOSIS — E119 Type 2 diabetes mellitus without complications: Secondary | ICD-10-CM

## 2011-03-11 MED ORDER — PROPRANOLOL HCL 40 MG PO TABS: 40.0000 mg | ORAL_TABLET | Freq: Two times a day (BID) | ORAL | Status: DC

## 2011-03-11 MED ORDER — HYDROCHLOROTHIAZIDE 25 MG PO TABS: 25.0000 mg | ORAL_TABLET | Freq: Every day | ORAL | Status: DC

## 2011-03-11 MED ORDER — FLUCONAZOLE 150 MG PO TABS: 150.0000 mg | ORAL_TABLET | Freq: Once | ORAL | Status: DC

## 2011-03-11 MED ORDER — CEPHALEXIN 250 MG PO CAPS: 250.0000 mg | ORAL_CAPSULE | Freq: Four times a day (QID) | ORAL | Status: AC

## 2011-03-11 NOTE — Patient Instructions (Addendum)
F/u in 4 months.  Congarts on improved blood sugars and weight loss, also commitment to regular exercise.  HBA1C in 4 months, no fasting necessary.  Ok to stop prescription vit D take OTC Vit D 800IU once daily  Weight loss goal of 2 pounds per month.  Med sent in for right grt t toe if pain persists past this week call for referral  Call and come for flu vaccine in September pls

## 2011-03-11 NOTE — Assessment & Plan Note (Signed)
Controlled, no change in medication  

## 2011-03-11 NOTE — Assessment & Plan Note (Signed)
Controlled and improved

## 2011-03-11 NOTE — Assessment & Plan Note (Signed)
Improved , post surgery

## 2011-03-11 NOTE — Assessment & Plan Note (Signed)
Improved. Pt applauded on succesful weight loss through lifestyle change, and encouraged to continue same. Weight loss goal set for the next several months.  

## 2011-03-11 NOTE — Progress Notes (Signed)
  Subjective:    Patient ID: [name], female    DOB: 20-Sep-1975, 35 y.o.   MRN: [id number]  HPI HYPERTENSION  Disease Monitoring  Blood pressure range-110/80 Chest pain- no      Dyspnea- no  Compliance- good Lightheadedness- no   Edema- no  DIABETES  Disease Monitoring  Blood Sugar ranges-daily, fastings range from 110 to 150  Polyuria- no New Visual problems- no  Compliance- good Hypoglycemic symptoms- no  Last eye exam: sched appt 03/25/2011      Podiatry visit: never  HYPERLIPIDEMIA  Compliance- n/a RUQ pain- no  Muscle aches- no   REGULAR EXERCISE-yes, 5 days per week  1 week h/o right great toe pain, no drainage  Or redness, 3 y/o daughter had stood on patient's foot in a hard shoe , and this started the problem    Review of Systems See HPI   Denies recent fever or chills. Denies sinus pressure, nasal congestion, ear pain or sore throat. Denies chest congestion, productive cough or wheezing. Denies chest pains, palpitations and leg swelling Denies abdominal pain, nausea, vomiting,diarrhea or constipation.   Denies dysuria, frequency, hesitancy or incontinence. Denies headaches, seizures, numbness, or tingling. Denies depression, anxiety or insomnia. Denies skin break down or rash.        Objective:   Physical Exam  Patient alert and oriented and in no cardiopulmonary distress.  HEENT: No facial asymmetry, EOMI, no sinus tenderness,  oropharynx pink and moist.  Neck supple no adenopathy.  Chest: Clear to auscultation bilaterally.  CVS: S1, S2 no murmurs, no S3.  ABD: Soft non tender. Bowel sounds normal.  Ext: No edema  MS: Adequate ROM spine, shoulders, hips and knees.  Skin: Intact, no ulcerations or rash noted.Right great toenail ingrown and tender on medial aspect, noerythema or warmth  Psych: Good eye contact, normal affect. Memory intact not anxious or depressed appearing.  CNS: CN 2-12 intact, power, tone and sensation  normal throughout.       Assessment & Plan:   No problem-specific assessment & plan notes found for this encounter.

## 2011-03-14 ENCOUNTER — Other Ambulatory Visit: Payer: Self-pay | Admitting: *Deleted

## 2011-03-27 ENCOUNTER — Other Ambulatory Visit (HOSPITAL_COMMUNITY)
Admission: RE | Admit: 2011-03-27 | Discharge: 2011-03-27 | Disposition: A | Discharge status: Home / Self Care | Payer: BC Managed Care – PPO | Source: Ambulatory Visit | Attending: Obstetrics and Gynecology | Admitting: Obstetrics and Gynecology

## 2011-03-27 ENCOUNTER — Other Ambulatory Visit: Payer: Self-pay | Admitting: Adult Health

## 2011-03-27 DIAGNOSIS — Z01419 Encounter for gynecological examination (general) (routine) without abnormal findings: Secondary | ICD-10-CM | POA: Insufficient documentation

## 2011-03-27 DIAGNOSIS — Z113 Encounter for screening for infections with a predominantly sexual mode of transmission: Secondary | ICD-10-CM | POA: Insufficient documentation

## 2011-05-09 LAB — COMPREHENSIVE METABOLIC PANEL
ALT: 9
AST: 17
Albumin: 2.5 — ABNORMAL LOW
Albumin: 2.4 — ABNORMAL LOW
Alkaline Phosphatase: 76
BUN: 3 — ABNORMAL LOW
CO2: 22
CO2: 23
Calcium: 8.6
Chloride: 105
Chloride: 102
Creatinine, Ser: 0.56
GFR calc Af Amer: >60
GFR calc non Af Amer: >60
GFR calc non Af Amer: >60
Glucose, Bld: 176 — ABNORMAL HIGH
Potassium: 3.8
Sodium: 134 — ABNORMAL LOW
Total Bilirubin: 0.3

## 2011-05-09 LAB — URINALYSIS, ROUTINE W REFLEX MICROSCOPIC
Glucose, UA: NEGATIVE
Hgb urine dipstick: NEGATIVE
Protein, ur: NEGATIVE
pH: 6

## 2011-05-09 LAB — CBC
HCT: 30.2 — ABNORMAL LOW
Hemoglobin: 10 — ABNORMAL LOW
MCHC: 34
MCHC: 32.7
MCV: 83.5
MCV: 84.7
Platelets: 161
Platelets: 157
RBC: 3.86 — ABNORMAL LOW
RBC: 3.15 — ABNORMAL LOW
RBC: 3.57 — ABNORMAL LOW
RDW: 14.3
WBC: 10
WBC: 10.2

## 2011-05-09 LAB — URIC ACID: Uric Acid, Serum: 3.3

## 2011-05-09 LAB — HIV RAPID SCREEN (BLD OR BODY FLD EXPOSURE): Rapid HIV Screen: NONREACTIVE

## 2011-05-23 ENCOUNTER — Other Ambulatory Visit: Payer: Self-pay | Admitting: Family Medicine

## 2011-05-27 ENCOUNTER — Telehealth: Payer: Self-pay | Admitting: Family Medicine

## 2011-05-27 MED ORDER — PROPRANOLOL HCL 40 MG PO TABS: 40.0000 mg | ORAL_TABLET | Freq: Two times a day (BID) | ORAL | Status: DC

## 2011-05-27 MED ORDER — GLUCOSE BLOOD VI STRP: ORAL_STRIP | Status: DC

## 2011-05-27 MED ORDER — ONETOUCH DELICA LANCETS MISC: Status: DC

## 2011-05-27 NOTE — Telephone Encounter (Signed)
Sent as requested.

## 2011-05-28 ENCOUNTER — Other Ambulatory Visit: Payer: Self-pay

## 2011-05-28 ENCOUNTER — Telehealth: Payer: Self-pay | Admitting: Family Medicine

## 2011-05-28 MED ORDER — PROPRANOLOL HCL 40 MG PO TABS: 40.0000 mg | ORAL_TABLET | Freq: Two times a day (BID) | ORAL | Status: DC

## 2011-05-28 MED ORDER — GLUCOSE BLOOD VI STRP: ORAL_STRIP | Status: DC

## 2011-05-28 NOTE — Telephone Encounter (Signed)
Addended by: Abner Greenspan on: 05/28/2011 04:49 PM   Modules accepted: Orders

## 2011-05-28 NOTE — Telephone Encounter (Signed)
See previous message

## 2011-05-28 NOTE — Telephone Encounter (Signed)
Spoke with her and sent meds to mail order when I got off the phone

## 2011-06-12 ENCOUNTER — Other Ambulatory Visit: Payer: Self-pay | Admitting: Family Medicine

## 2011-07-01 ENCOUNTER — Encounter: Payer: Self-pay | Admitting: Family Medicine

## 2011-07-09 LAB — HEMOGLOBIN A1C: Hgb A1c MFr Bld: 6.6 % — ABNORMAL HIGH (ref <5.7)

## 2011-07-12 ENCOUNTER — Encounter: Payer: Self-pay | Admitting: Family Medicine

## 2011-07-12 ENCOUNTER — Ambulatory Visit (INDEPENDENT_AMBULATORY_CARE_PROVIDER_SITE_OTHER): Payer: BC Managed Care – PPO | Admitting: Family Medicine

## 2011-07-12 ENCOUNTER — Other Ambulatory Visit: Payer: Self-pay

## 2011-07-12 VITALS — BP 126/78 | HR 78 | Resp 18 | Ht 62.0 in | Wt 194.0 lb

## 2011-07-12 DIAGNOSIS — E669 Obesity, unspecified: Secondary | ICD-10-CM

## 2011-07-12 DIAGNOSIS — I10 Essential (primary) hypertension: Secondary | ICD-10-CM

## 2011-07-12 DIAGNOSIS — IMO0002 Reserved for concepts with insufficient information to code with codable children: Secondary | ICD-10-CM

## 2011-07-12 DIAGNOSIS — E119 Type 2 diabetes mellitus without complications: Secondary | ICD-10-CM

## 2011-07-12 DIAGNOSIS — Z13220 Encounter for screening for lipoid disorders: Secondary | ICD-10-CM

## 2011-07-12 MED ORDER — HYDROCHLOROTHIAZIDE 25 MG PO TABS: 25.0000 mg | ORAL_TABLET | Freq: Every day | ORAL | Status: DC

## 2011-07-12 NOTE — Assessment & Plan Note (Signed)
Controlled, no change in medication  

## 2011-07-12 NOTE — Progress Notes (Signed)
  Subjective:    Patient ID: [name], female    DOB: 1975/09/10, 35 y.o.   MRN: [id number]  HPI The PT is here for follow up and re-evaluation of chronic medical conditions, medication management and review of any available recent lab and radiology data.  Preventive health is updated, specifically  Cancer screening and Immunization.   Questions or concerns regarding consultations or procedures which the PT has had in the interim are  addressed. The PT denies any adverse reactions to current medications since the last visit.  There are no new concerns. She is however wanting to start a weight loss program , needs to change her diet, drinks a lot of sweet tea, and snacks on nabs There are no specific complaints . Fasting blood sugars are generally around 90      Review of Systems See HPI Denies recent fever or chills. Denies sinus pressure, nasal congestion, ear pain or sore throat. Denies chest congestion, productive cough or wheezing. Denies chest pains, palpitations and leg swelling Denies abdominal pain, nausea, vomiting,diarrhea or constipation.   Denies dysuria, frequency, hesitancy or incontinence. Denies joint pain, swelling and limitation in mobility. Denies headaches, seizures, numbness, or tingling. Denies depression, anxiety or insomnia. Denies skin break down or rash.        Objective:   Physical Exam  Patient alert and oriented and in no cardiopulmonary distress.  HEENT: No facial asymmetry, EOMI, no sinus tenderness,  oropharynx pink and moist.  Neck supple no adenopathy.  Chest: Clear to auscultation bilaterally.  CVS: S1, S2 no murmurs, no S3.  ABD: Soft non tender. Bowel sounds normal.  Ext: No edema  MS: Adequate ROM spine, shoulders, hips and knees.  Skin: Intact, no ulcerations or rash noted.  Psych: Good eye contact, normal affect. Memory intact not anxious or depressed appearing.  CNS: CN 2-12 intact, power, tone and sensation  normal throughout.       Assessment & Plan:

## 2011-07-12 NOTE — Assessment & Plan Note (Signed)
Controlled, no change in medication Low carb diet discussed and encouraged

## 2011-07-12 NOTE — Patient Instructions (Signed)
F/u end March.  It is important that you exercise regularly at least 40 minutes 5 times a week. If you develop chest pain, have severe difficulty breathing, or feel very tired, stop exercising immediately and seek medical attention    A healthy diet is rich in fruit, vegetables and whole grains. Poultry fish, nuts and beans are a healthy choice for protein rather then red meat. A low sodium diet and drinking 64 ounces of water daily is generally recommended. Oils and sweet should be limited. Carbohydrates especially for those who are diabetic or overweight, should be limited to 30-45 gram per meal. It is important to eat on a regular schedule, at least 3 times daily. Snacks should be primarily fruits, vegetables or nuts.  pls stop sweet tea and snacks like chips and nabs  Weight loss goal of 2 to 3 pounds per month.  No med changes.  Fasting lipid, cmp and EGFR, hBA1c and microalb  End March after October 31, 2011 please.  All the best for 2013!

## 2011-07-12 NOTE — Assessment & Plan Note (Signed)
Unchanged. Patient re-educated about  the importance of commitment to a  minimum of 150 minutes of exercise per week. The importance of healthy food choices with portion control discussed. Encouraged to start a food diary, count calories and to consider  joining a support group. Sample diet sheets offered. Goals set by the patient for the next several months.    

## 2011-07-12 NOTE — Assessment & Plan Note (Signed)
Resolved with surgery

## 2011-07-18 ENCOUNTER — Telehealth: Payer: Self-pay | Admitting: Family Medicine

## 2011-07-19 NOTE — Telephone Encounter (Signed)
Patient aware they will be here Monday

## 2011-08-13 NOTE — L&D Delivery Note (Signed)
Delivery Note At 9:32 AM a non-viable female was delivered via VBAC, Spontaneous (Presentation: Right Occiput Anterior).  APGAR: 0/0 weight deferred.   Placenta status: Intact, Spontaneous.  Cord:  with the following complications: Non-viable fetus.  Cord pH: n/a  Anesthesia: None  Episiotomy: None Lacerations: None Suture Repair: n/a Est. Blood Loss (mL): 200  Mom to postpartum.  Baby to NA.  Andrena Mews, DO [facility] Resident - PGY-1 01/16/2012 9:50 AM

## 2011-08-23 ENCOUNTER — Encounter: Payer: Self-pay | Admitting: Family Medicine

## 2011-08-26 ENCOUNTER — Encounter: Payer: Self-pay | Admitting: Family Medicine

## 2011-08-26 ENCOUNTER — Telehealth (HOSPITAL_COMMUNITY): Payer: Self-pay | Admitting: Dietician

## 2011-08-26 ENCOUNTER — Ambulatory Visit (INDEPENDENT_AMBULATORY_CARE_PROVIDER_SITE_OTHER): Payer: BC Managed Care – PPO | Admitting: Family Medicine

## 2011-08-26 ENCOUNTER — Ambulatory Visit (HOSPITAL_COMMUNITY)
Admission: RE | Admit: 2011-08-26 | Discharge: 2011-08-26 | Disposition: A | Discharge status: Home / Self Care | Payer: BC Managed Care – PPO | Source: Ambulatory Visit | Attending: Family Medicine | Admitting: Family Medicine

## 2011-08-26 VITALS — BP 120/80 | HR 79 | Resp 16 | Ht 62.0 in | Wt 193.1 lb

## 2011-08-26 DIAGNOSIS — M542 Cervicalgia: Secondary | ICD-10-CM

## 2011-08-26 DIAGNOSIS — M79609 Pain in unspecified limb: Secondary | ICD-10-CM | POA: Insufficient documentation

## 2011-08-26 DIAGNOSIS — I10 Essential (primary) hypertension: Secondary | ICD-10-CM

## 2011-08-26 DIAGNOSIS — E669 Obesity, unspecified: Secondary | ICD-10-CM

## 2011-08-26 DIAGNOSIS — E119 Type 2 diabetes mellitus without complications: Secondary | ICD-10-CM

## 2011-08-26 DIAGNOSIS — M79602 Pain in left arm: Secondary | ICD-10-CM

## 2011-08-26 MED ORDER — GLUCOSE BLOOD VI STRP: ORAL_STRIP | Status: AC

## 2011-08-26 MED ORDER — ONETOUCH DELICA LANCETS MISC: Status: DC

## 2011-08-26 MED ORDER — IBUPROFEN 800 MG PO TABS: 800.0000 mg | ORAL_TABLET | Freq: Three times a day (TID) | ORAL | Status: AC | PRN

## 2011-08-26 MED ORDER — PROPRANOLOL HCL 40 MG PO TABS: 40.0000 mg | ORAL_TABLET | Freq: Two times a day (BID) | ORAL | Status: DC

## 2011-08-26 MED ORDER — PREDNISONE (PAK) 5 MG PO TABS: 5.0000 mg | ORAL_TABLET | ORAL | Status: DC

## 2011-08-26 NOTE — Progress Notes (Signed)
  Subjective:    Patient ID: [name], female    DOB: Dec 28, 1975, 36 y.o.   MRN: [id number]  HPI 3 week h/o burning pain in arm and forearm, awakens pt from sleep, she also notes difference in sensation . No inciting trauma known. Of note she has had sucesful lumbar spinal surgery in the past. Weight is unchanged though she has made lifestyle changes, will make appt with dietitian. Reports that at times she has  to eat to keep her sugar up, I suggest reduction in diabeta to one daily after completion of steroids   Review of Systems See HPI Denies recent fever or chills. Denies sinus pressure, nasal congestion, ear pain or sore throat. Denies chest congestion, productive cough or wheezing. Denies chest pains, palpitations and leg swelling Denies abdominal pain, nausea, vomiting,diarrhea or constipation.   Denies dysuria, frequency, hesitancy or incontinence.  Denies headaches, seizures, numbness, or tingling. Denies depression, anxiety or insomnia. Denies skin break down or rash.        Objective:   Physical Exam Patient alert and oriented and in no cardiopulmonary distress.  HEENT: No facial asymmetry, EOMI, no sinus tenderness,  oropharynx pink and moist.  Neck supple no adenopathy.  Chest: Clear to auscultation bilaterally.  CVS: S1, S2 no murmurs, no S3.  ABD: Soft non tender. Bowel sounds normal.  Ext: No edema  MS: Adequate ROM spine, , hips and knees.Decreased ROM left arm  Skin: Intact, no ulcerations or rash noted.  Psych: Good eye contact, normal affect. Memory intact not anxious or depressed appearing.  CNS: CN 2-12 intact, abnormal sensation in left arm, weak grip on left hand        Assessment & Plan:

## 2011-08-26 NOTE — Assessment & Plan Note (Signed)
Decrease glyburide dose in Feb, pt reports having to eat to keep up her blood sugar

## 2011-08-26 NOTE — Telephone Encounter (Signed)
Appointment scheduled for 09/02/11 at 8 AM.

## 2011-08-26 NOTE — Assessment & Plan Note (Signed)
Unchanged. Patient re-educated about  the importance of commitment to a  minimum of 150 minutes of exercise per week. The importance of healthy food choices with portion control discussed. Encouraged to start a food diary, count calories and to consider  joining a support group. Sample diet sheets offered. Goals set by the patient for the next several months.    

## 2011-08-26 NOTE — Patient Instructions (Addendum)
F/U as before.  I bellieve that the arm pain is due to nerve irritation in your neck.  Medication is prescribed, please take as directed. If symptoms persist, you will need to call back so I can arrange for an MRI of your neck. Neck x ray today.  In February , pls cut the diabeta to one daily, if your blood sugar can be controlled on that.  Please call and reschedule appt with the dietitian weight loss goal of approx 2 pounds per month  We will provide a 1500 cal diet sheet  Labs prior to visit

## 2011-08-26 NOTE — Assessment & Plan Note (Signed)
Controlled, no change in medication  

## 2011-09-02 ENCOUNTER — Encounter (HOSPITAL_COMMUNITY): Payer: Self-pay | Admitting: Dietician

## 2011-09-03 ENCOUNTER — Encounter (HOSPITAL_COMMUNITY): Payer: Self-pay | Admitting: Dietician

## 2011-09-03 NOTE — Progress Notes (Signed)
Outpatient Initial Nutrition Assessment  Date:09/02/2011   Time: 8:00 AM  Referring Physician: Dr. Lodema Hong Reason for Visit: diabetes, obesity, HTN, hyperlipidemia  Nutrition Assessment:  Ht: 62" Wt: 189# IBW: 110# %IBW: 172% UBW: 189# %UBW: 100% BMI: 34.57 Goal Weight: 160# Weight hx: Pt reports highest weight of 196# 2 years ago. Noted weight of 199.5# when pt was referred in 10/2010. Pt lowest weight was 175# at age 36.   Estimated nutritional needs: 1770-1931 kcals daily, 69-86 grams protein daily, 1.8-1.9 L fluid daily  PMH:  Past Medical History  Diagnosis Date  . GERD (gastroesophageal reflux disease)   . Hypertension   . Constipation   . NIDDM (non-insulin dependent diabetes mellitus) 2005    no complications   . Bilateral cataracts     mild   . Morbid obesity     Medications: Current Outpatient Rx  Name Route Sig Dispense Refill  . GLUCOSE BLOOD VI STRP  Use as instructed for twice daily testing 100 each 5  . GLUCOSE BLOOD VI STRP  Once daily testing 100 each 1  . GLYBURIDE 2.5 MG PO TABS  TAKE ONE TABLET BY MOUTH TWICE DAILY 60 tablet 3  . HYDROCHLOROTHIAZIDE 25 MG PO TABS Oral Take 1 tablet (25 mg total) by mouth daily. 90 tablet 3  . IBUPROFEN 800 MG PO TABS Oral Take 1 tablet (800 mg total) by mouth every 8 (eight) hours as needed for pain. 30 tablet 0  . JANUMET 50-1000 MG PO TABS  TAKE ONE TABLET BY MOUTH TWICE DAILY 60 each 2  . MEDROXYPROGESTERONE ACETATE PO Oral Take 10 mg by mouth. Take one tablet by mouth at bedtime for 10 days every 2 months     . ONETOUCH DELICA LANCETS MISC  Once daily testing 100 each 5  . PREDNISONE (PAK) 5 MG PO TABS Oral Take 1 tablet (5 mg total) by mouth as directed. 21 tablet 0  . PROPRANOLOL HCL 40 MG PO TABS Oral Take 1 tablet (40 mg total) by mouth 2 (two) times daily. 60 tablet 3    Labs:  CMP     Component Value Date/Time   NA 139 03/06/2011 0807   K 3.8 03/06/2011 0807   CL 98 03/06/2011 0807   CO2 29 03/06/2011 0807   GLUCOSE 145* 03/06/2011 0807   BUN 8 03/06/2011 0807   CREATININE 0.73 03/06/2011 0807   CREATININE 0.81 10/24/2010 1732   CALCIUM 9.5 03/06/2011 0807   PROT 6.8 03/06/2011 0807   ALBUMIN 3.9 03/06/2011 0807   AST 21 03/06/2011 0807   ALT 28 03/06/2011 0807   ALKPHOS 63 03/06/2011 0807   BILITOT 0.4 03/06/2011 0807   GFRNONAA >60 01/25/2008 1757   GFRAA  Value: >60        The eGFR has been calculated using the MDRD equation. This calculation has not been validated in all clinical 01/25/2008 1757     Lab Results  Component Value Date   HGBA1C 6.6* 07/08/2011   HGBA1C 6.6* 03/06/2011   HGBA1C 6.7* 10/24/2010   Lab Results  Component Value Date   MICROALBUR 0.94 10/30/2010   LDLCALC 97 03/06/2011   CREATININE 0.73 03/06/2011    Lipid Panel     Component Value Date/Time   CHOL 152 03/06/2011 0807   TRIG 89 03/06/2011 0807   HDL 37* 03/06/2011 0807   CHOLHDL 4.1 03/06/2011 0807   VLDL 18 03/06/2011 0807   LDLCALC 97 03/06/2011 0807   Nutrition hx/habits: [name] is most  concerned about her weight and desires weight loss to help improve her multiple health problems. She has made some lifestyle changes including cutting back on snacks (nabs, crackers, and jello), decreasing sweet tea, and eliminating fast food. She has also started back walking 3-5 times per week for 30-45 minutes daily. She reports her stress level as 5 on a scale of 1-10, indicating caring for her 27 year old daughter, health, and finances are her main stressors. She reports that she has eaten 1-2 meals per day practically her whole life. She is a stay at home mother, but often does not eat with her daughter at meal time. She eats out 1-2 times per month, eating mostly fried flounder, coleslaw, and baked potato and [employer]. She checks her glucose once daily, readings ranging from 100-115.   Diet recall: Breakfast (9 AM): cereal bar OR frozen biscuit with fruit OR cereal (Kix, Apple Jacks) with 2% milk, water; Snack (sometimes): canned  fruit in light syrup; Lunch (2 PM): sandwich OR tuna with crackers OR hot dog with regular Kool Aid or tea; Snack (sometimes): potato chips or Quaker bars, tea; Dinner: meat and vegetable OR fruit, tea  Nutrition Diagnosis: Inconsistent carbohydrate intake r/t skipping meals, diet high in refined carbohydrates AEB 1-2 meals per day, increased average plasma glucose of 143.  Nutrition Intervention: Nutrition rx: 1500 calorie diabetic, NAS diet; 3 meals/day; limit 1 starch per meal; low calorie beverages only; 30 minutes physical activity daily   Education/Counseling Provided: Educated pt on plate method, portion control, sources of carbohydrates, and weight loss strategies. Educated pt on food label reading. Discussed importance of eating 3 meals per day. Also discussed importance of regular physical activity to aid with weight loss. Discussed importance of tracking progress to keep accountability, showed functionality of [email or web address] and encouraged use. Provided plate method handout and handouts from the Academy of Nutrition and Dietetics re: 1500 calorie diet, Weight Loss Tips, and Food Label Reading.   Understanding, Motivation, Ability to Follow Recommendations: Expect fair to good compliance.   Monitoring and Evaluation: Goals: 1) 1-2# weight loss per week; 2) 30 minutes physical activity daily; 3) 3 meals/day  Recommendations: 1) For weight loss: 1270-1431 kcals daily; 2) Eat with daughter during meal times to keep consistent meal schedule; 3) Keep food diary; 4) Substitute unsweetened tea with Splenda, sugar free jello, and diet soda for regular jello, sweet tea, and regular soda   F/U: PRN. Provided RD contact information.   [name], RD  09/02/2011  Time: 8:00 AM

## 2011-09-11 LAB — OB RESULTS CONSOLE HIV ANTIBODY (ROUTINE TESTING): HIV: NONREACTIVE

## 2011-09-11 LAB — OB RESULTS CONSOLE ANTIBODY SCREEN: Antibody Screen: NEGATIVE

## 2011-09-11 LAB — OB RESULTS CONSOLE RPR: RPR: NONREACTIVE

## 2011-09-11 LAB — OB RESULTS CONSOLE HEPATITIS B SURFACE ANTIGEN: Hepatitis B Surface Ag: NEGATIVE

## 2011-10-16 ENCOUNTER — Telehealth: Payer: Self-pay | Admitting: Family Medicine

## 2011-10-16 NOTE — Telephone Encounter (Signed)
congrats will see her after the baby!

## 2011-10-21 ENCOUNTER — Other Ambulatory Visit: Payer: Self-pay | Admitting: Family Medicine

## 2011-11-08 ENCOUNTER — Ambulatory Visit: Payer: BC Managed Care – PPO | Admitting: Family Medicine

## 2012-01-06 ENCOUNTER — Other Ambulatory Visit: Payer: Self-pay | Admitting: Family Medicine

## 2012-01-14 ENCOUNTER — Encounter (HOSPITAL_COMMUNITY): Payer: Self-pay | Admitting: *Deleted

## 2012-01-14 ENCOUNTER — Telehealth (HOSPITAL_COMMUNITY): Payer: Self-pay | Admitting: *Deleted

## 2012-01-14 NOTE — Telephone Encounter (Signed)
Preadmission screen  

## 2012-01-15 ENCOUNTER — Inpatient Hospital Stay (HOSPITAL_COMMUNITY)
Admission: AD | Admit: 2012-01-15 | Discharge: 2012-01-17 | DRG: 372 | Disposition: A | Discharge status: Home / Self Care | Payer: BC Managed Care – PPO | Source: Ambulatory Visit | Attending: Obstetrics and Gynecology | Admitting: Obstetrics and Gynecology

## 2012-01-15 ENCOUNTER — Other Ambulatory Visit: Payer: Self-pay | Admitting: Obstetrics and Gynecology

## 2012-01-15 ENCOUNTER — Encounter (HOSPITAL_COMMUNITY): Payer: Self-pay

## 2012-01-15 DIAGNOSIS — I10 Essential (primary) hypertension: Secondary | ICD-10-CM

## 2012-01-15 DIAGNOSIS — E119 Type 2 diabetes mellitus without complications: Secondary | ICD-10-CM | POA: Diagnosis present

## 2012-01-15 DIAGNOSIS — O321XX0 Maternal care for breech presentation, not applicable or unspecified: Secondary | ICD-10-CM | POA: Diagnosis present

## 2012-01-15 DIAGNOSIS — O1002 Pre-existing essential hypertension complicating childbirth: Secondary | ICD-10-CM | POA: Diagnosis present

## 2012-01-15 DIAGNOSIS — O364XX0 Maternal care for intrauterine death, not applicable or unspecified: Principal | ICD-10-CM | POA: Diagnosis present

## 2012-01-15 DIAGNOSIS — O2493 Unspecified diabetes mellitus in the puerperium: Secondary | ICD-10-CM | POA: Diagnosis present

## 2012-01-15 LAB — GLUCOSE, CAPILLARY: Glucose-Capillary: 103 mg/dL — ABNORMAL HIGH (ref 70–99)

## 2012-01-15 LAB — CBC
HCT: 33.7 % — ABNORMAL LOW (ref 36.0–46.0)
Hemoglobin: 11 g/dL — ABNORMAL LOW (ref 12.0–15.0)
MCHC: 32.6 g/dL (ref 30.0–36.0)
MCV: 81.2 fL (ref 78.0–100.0)
RDW: 13.6 % (ref 11.5–15.5)

## 2012-01-15 LAB — BASIC METABOLIC PANEL
BUN: 9 mg/dL (ref 6–23)
CO2: 25 mEq/L (ref 19–32)
Chloride: 98 mEq/L (ref 96–112)
Creatinine, Ser: 0.77 mg/dL (ref 0.50–1.10)
GFR calc Af Amer: >90 mL/min (ref >90)
Glucose, Bld: 111 mg/dL — ABNORMAL HIGH (ref 70–99)
Potassium: 3.6 mEq/L (ref 3.5–5.1)

## 2012-01-15 MED ORDER — SODIUM CHLORIDE 0.9 % IJ SOLN: 9.0000 mL | INTRAMUSCULAR | Status: DC | PRN

## 2012-01-15 MED ORDER — GLYBURIDE 2.5 MG PO TABS
2.5000 mg | ORAL_TABLET | Freq: Two times a day (BID) | ORAL | Status: AC
  Administered 2012-01-15: 2.5 mg via ORAL
  Filled 2012-01-15: qty 1

## 2012-01-15 MED ORDER — DIPHENHYDRAMINE HCL 50 MG/ML IJ SOLN
12.5000 mg | Freq: Four times a day (QID) | INTRAMUSCULAR | Status: DC | PRN
  Administered 2012-01-16: 12.5 mg via INTRAVENOUS
  Filled 2012-01-15: qty 1

## 2012-01-15 MED ORDER — NALOXONE HCL 0.4 MG/ML IJ SOLN: 0.4000 mg | INTRAMUSCULAR | Status: DC | PRN

## 2012-01-15 MED ORDER — DIPHENHYDRAMINE HCL 12.5 MG/5ML PO ELIX
12.5000 mg | ORAL_SOLUTION | Freq: Four times a day (QID) | ORAL | Status: DC | PRN
  Filled 2012-01-15: qty 5

## 2012-01-15 MED ORDER — HYDROMORPHONE 0.3 MG/ML IV SOLN
INTRAVENOUS | Status: DC
  Administered 2012-01-15: 0.3 mg via INTRAVENOUS
  Administered 2012-01-16: 5.67 mL via INTRAVENOUS
  Administered 2012-01-16: 8 mL via INTRAVENOUS
  Filled 2012-01-15 (×2): qty 25

## 2012-01-15 MED ORDER — ZOLPIDEM TARTRATE 10 MG PO TABS: 10.0000 mg | ORAL_TABLET | Freq: Every evening | ORAL | Status: DC | PRN

## 2012-01-15 MED ORDER — MISOPROSTOL 200 MCG PO TABS
400.0000 ug | ORAL_TABLET | ORAL | Status: DC
  Administered 2012-01-16 (×2): 400 ug via VAGINAL
  Filled 2012-01-15 (×2): qty 2

## 2012-01-15 MED ORDER — ONDANSETRON HCL 4 MG/2ML IJ SOLN: 4.0000 mg | Freq: Four times a day (QID) | INTRAMUSCULAR | Status: DC | PRN

## 2012-01-15 MED ORDER — TERBUTALINE SULFATE 1 MG/ML IJ SOLN: 0.2500 mg | Freq: Once | INTRAMUSCULAR | Status: AC | PRN

## 2012-01-15 MED ORDER — MISOPROSTOL 200 MCG PO TABS
200.0000 ug | ORAL_TABLET | ORAL | Status: DC
  Administered 2012-01-15: 200 ug via VAGINAL
  Filled 2012-01-15: qty 1

## 2012-01-15 MED ORDER — LACTATED RINGERS IV SOLN
INTRAVENOUS | Status: DC
  Administered 2012-01-15 – 2012-01-16 (×2): via INTRAVENOUS

## 2012-01-15 MED ORDER — SODIUM CHLORIDE 0.9 % IV SOLN: INTRAVENOUS | Status: DC

## 2012-01-15 NOTE — Progress Notes (Signed)
Laminaria removed by MD

## 2012-01-15 NOTE — H&P (Signed)
[name] is a 36 y.o. female gravida 4 para 1-0-2-1  presenting for induction of labor due to fetal demise in utero. She is 23 weeks 5 days today at twice a day identified on office ultrasound 01/13/2012. Last perceived fetal movement was 01/11/2012. She had a prior documented low transverse cervical cesarean section for nonreassuring. FHT with her first pregnancy delivered 01/26/2008 at [facility] hospital. She had 2 seaweed laminaria inserted 8 AM this morning at the office. She reports mild cramping for the last 24 hours without bleeding spotting or membrane rupture she is afebrile. She is aware of the need for slow induction due to her prior history of cesarean delivery, and the increased risk of uterine rupture associated with prior uterine incisions At this time she states that she desires to proceed with tubal sterilization after this pregnancy. I have told her this would not be addressed during this hospitalization. History OB History    Grav Para Term Preterm Abortions TAB SAB Ect Mult Living   4 1 1  2  2   1      Past Medical History  Diagnosis Date  . GERD (gastroesophageal reflux disease)   . Hypertension   . Constipation   . Bilateral cataracts     mild   . Morbid obesity   . NIDDM (non-insulin dependent diabetes mellitus) 2005    no complications , takes POs when not pregnant   Past Surgical History  Procedure Date  . Cesarean section 6/09  . Back surgery 05/25/2010    Dr,Kabell    Family History: family history includes Diabetes in her father and mother; Hypertension in her father and mother; Kidney disease in her father; and Kidney failure in her father. Social History:  reports that she has never smoked. She has never used smokeless tobacco. She reports that she does not drink alcohol or use illicit drugs.  ROS PAST medical history  :Mild cramping x24 hours. Normal blood sugars during pregnancy today on Janumet and glyburide/2.5 mg along with Lantus 20 units at  at bedtime   daily prenatal labs today include blood type O positive hemoglobin 11.8 hematocrit 36.7% platelets 239,000 Pap smear normal GC and Chlamydia cultures negative shown elevated Down syndrome risk was Down syndrome risk quoted at 1:190 compared to a congested risk of 1:300 high many testing was performed which showed her to be low risk for genetic abnormalities and screening ultrasound was normal at [redacted] weeks gestation    Last menstrual period 07/05/2011. Exam Physical Exam Physical Examination: General appearance - alert, well appearing, and in no distress, oriented to person, place, and time, overweight, anxious and appropriate questions and comments. She is well supported by her husband Mental status - alert, oriented to person, place, and time, normal mood, behavior, speech, dress, motor activity, and thought processes,  Chest - clear to auscultation, no wheezes, rales or rhonchi, symmetric air entry Heart - normal rate and regular rhythm Abdomen - soft, nontender, nondistended, no masses or organomegaly Gravid uterus consistent with dates at umbilicus +4 cm Pelvic - normal external genitalia. The vaginal length. Cervix nonpurulent without spotting laminaria inserted x2 this morning Extremities - peripheral pulses normal, no pedal edema, no clubbing or cyanosis, Homan's sign negative bilaterally Prenatal labs: ABO, Rh: O/Positive/-- (01/30 0000) Antibody: Negative (01/30 0000) Rubella: Immune (01/30 0000) RPR: Nonreactive (01/30 0000)  HBsAg: Negative (01/30 0000)  HIV: Non-reactive (01/30 0000)  GBS:     Assessment/PlaRunning: Fetal demise in utero [redacted] weeks gestation 2 type  2 diabetes mellitus on Janumet thousand milligrams twice a day, glyburide 2.5 mg twice a day and Lantus Chronic hypertension on propranolol 40 mg daily: Will obtain CBGs every 4 hours and consider placing her On glucose stabilizer during active labor  Ione Sandusky V 01/15/2012, 3:23 PM

## 2012-01-15 NOTE — Progress Notes (Signed)
[name] is a 36 y.o. [phone] at [redacted]w[redacted]d by ultrasound admitted for fetal demise in utero, breech, CHTN, DMII  Subjective:minimal cramping since laminaria.Pt now has laminaria removed, Cx 1 cm   Objective:cx:1 cm   Double balloon foley placed. 50  Cc in upper, 15 ccin lower LMP 07/05/2011      FHT:  absent UC:   none SVE:    1/0%/-3  Labs: Lab Results  Component Value Date   WBC 8.5 03/06/2011   HGB 11.3* 03/06/2011   HCT 37.2 03/06/2011   MCV 82.7 03/06/2011   PLT 239 03/06/2011    Assessment / Plan: add CYTOTEc at 20:00 Induction of labor due to fetal demise,  progressing well on pitocin  Labor: none yet Preeclampsia:  no signs or symptoms of toxicity Fetal Wellbeing:  n/a, breech on last u/.s Pain Control:  Labor support without medications I/D:   Anticipated MOD:  NSVD  [name] V 01/15/2012, 6:32 PM

## 2012-01-15 NOTE — Progress Notes (Signed)
Patient having a few mild contractions with 2-bulb foley in cervix,and 1 dose cytotec 200 mcg inserted. No bleeding or d/c Vs stable Bp stable   CBGs good CBG (last 3)   Basename 01/15/12 1930  GLUCAP 103*    Assess: FDIU 23 wk, induction  Using cytotec plus Foley in cervix INcrease dose to 400 MCG q 4hr.

## 2012-01-16 ENCOUNTER — Inpatient Hospital Stay (HOSPITAL_COMMUNITY): Admission: RE | Admit: 2012-01-16 | Payer: BC Managed Care – PPO | Source: Ambulatory Visit

## 2012-01-16 ENCOUNTER — Encounter (HOSPITAL_COMMUNITY): Payer: Self-pay | Admitting: *Deleted

## 2012-01-16 DIAGNOSIS — O364XX0 Maternal care for intrauterine death, not applicable or unspecified: Secondary | ICD-10-CM

## 2012-01-16 DIAGNOSIS — O321XX0 Maternal care for breech presentation, not applicable or unspecified: Secondary | ICD-10-CM

## 2012-01-16 DIAGNOSIS — O1002 Pre-existing essential hypertension complicating childbirth: Secondary | ICD-10-CM

## 2012-01-16 DIAGNOSIS — O2493 Unspecified diabetes mellitus in the puerperium: Secondary | ICD-10-CM

## 2012-01-16 LAB — GLUCOSE, CAPILLARY
Glucose-Capillary: 130 mg/dL — ABNORMAL HIGH (ref 70–99)
Glucose-Capillary: 118 mg/dL — ABNORMAL HIGH (ref 70–99)

## 2012-01-16 LAB — HEMOGLOBIN A1C: Mean Plasma Glucose: 140 mg/dL — ABNORMAL HIGH (ref <117)

## 2012-01-16 MED ORDER — IBUPROFEN 600 MG PO TABS
600.0000 mg | ORAL_TABLET | Freq: Four times a day (QID) | ORAL | Status: DC
  Administered 2012-01-16 – 2012-01-17 (×3): 600 mg via ORAL
  Filled 2012-01-16 (×3): qty 1

## 2012-01-16 MED ORDER — DIPHENHYDRAMINE HCL 12.5 MG/5ML PO ELIX
12.5000 mg | ORAL_SOLUTION | Freq: Four times a day (QID) | ORAL | Status: DC | PRN
  Filled 2012-01-16: qty 5

## 2012-01-16 MED ORDER — DIBUCAINE 1 % RE OINT: 1.0000 "application " | TOPICAL_OINTMENT | RECTAL | Status: DC | PRN

## 2012-01-16 MED ORDER — LANOLIN HYDROUS EX OINT: TOPICAL_OINTMENT | CUTANEOUS | Status: DC | PRN

## 2012-01-16 MED ORDER — ZOLPIDEM TARTRATE 5 MG PO TABS: 5.0000 mg | ORAL_TABLET | Freq: Every evening | ORAL | Status: DC | PRN

## 2012-01-16 MED ORDER — OXYTOCIN 20 UNITS IN LACTATED RINGERS INFUSION - SIMPLE
INTRAVENOUS | Status: AC
  Administered 2012-01-16: 20 [IU]
  Filled 2012-01-16: qty 1000

## 2012-01-16 MED ORDER — ONDANSETRON HCL 4 MG/2ML IJ SOLN: 4.0000 mg | INTRAMUSCULAR | Status: DC | PRN

## 2012-01-16 MED ORDER — PRENATAL MULTIVITAMIN CH: 1.0000 | ORAL_TABLET | Freq: Every day | ORAL | Status: DC

## 2012-01-16 MED ORDER — ONDANSETRON HCL 4 MG PO TABS: 4.0000 mg | ORAL_TABLET | ORAL | Status: DC | PRN

## 2012-01-16 MED ORDER — SODIUM CHLORIDE 0.9 % IJ SOLN: 9.0000 mL | INTRAMUSCULAR | Status: DC | PRN

## 2012-01-16 MED ORDER — DIPHENHYDRAMINE HCL 50 MG/ML IJ SOLN: 12.5000 mg | Freq: Four times a day (QID) | INTRAMUSCULAR | Status: DC | PRN

## 2012-01-16 MED ORDER — TETANUS-DIPHTH-ACELL PERTUSSIS 5-2.5-18.5 LF-MCG/0.5 IM SUSP: 0.5000 mL | Freq: Once | INTRAMUSCULAR | Status: DC

## 2012-01-16 MED ORDER — HYDROMORPHONE 0.3 MG/ML IV SOLN: INTRAVENOUS | Status: DC

## 2012-01-16 MED ORDER — BENZOCAINE-MENTHOL 20-0.5 % EX AERO: 1.0000 "application " | INHALATION_SPRAY | CUTANEOUS | Status: DC | PRN

## 2012-01-16 MED ORDER — OXYCODONE-ACETAMINOPHEN 5-325 MG PO TABS: 1.0000 | ORAL_TABLET | ORAL | Status: DC | PRN

## 2012-01-16 MED ORDER — WITCH HAZEL-GLYCERIN EX PADS: 1.0000 "application " | MEDICATED_PAD | CUTANEOUS | Status: DC | PRN

## 2012-01-16 MED ORDER — NALOXONE HCL 0.4 MG/ML IJ SOLN: 0.4000 mg | INTRAMUSCULAR | Status: DC | PRN

## 2012-01-16 MED ORDER — DIPHENHYDRAMINE HCL 25 MG PO CAPS: 25.0000 mg | ORAL_CAPSULE | Freq: Four times a day (QID) | ORAL | Status: DC | PRN

## 2012-01-16 MED ORDER — ONDANSETRON HCL 4 MG/2ML IJ SOLN: 4.0000 mg | Freq: Four times a day (QID) | INTRAMUSCULAR | Status: DC | PRN

## 2012-01-16 MED ORDER — SENNOSIDES-DOCUSATE SODIUM 8.6-50 MG PO TABS
2.0000 | ORAL_TABLET | Freq: Every day | ORAL | Status: DC
  Administered 2012-01-16: 2 via ORAL

## 2012-01-16 MED ORDER — SIMETHICONE 80 MG PO CHEW: 80.0000 mg | CHEWABLE_TABLET | ORAL | Status: DC | PRN

## 2012-01-16 MED ORDER — PROPRANOLOL HCL 40 MG PO TABS
40.0000 mg | ORAL_TABLET | Freq: Two times a day (BID) | ORAL | Status: DC
  Filled 2012-01-16 (×2): qty 1

## 2012-01-16 NOTE — Progress Notes (Signed)
I was paged to unit by nurse after delivery.  Family was tearful.  This was their third loss (the first two were early in the pregnancy).  They have an almost 35 year-old daughter.  Family included FOB, Ramon Dredge, and Janita's father as well as a church family member.  Family requested prayer for baby Ramon Dredge.  It was beautiful to have staff join in the prayer with [place].    I offered grief support and grief education as well as emotional and spiritual support.  [employer] Pager, [phone] 10:42 AM   01/16/12 1000  Clinical Encounter Type  Visited With Patient and family together  Visit Type Spiritual support  Referral From Nurse  Spiritual Encounters  Spiritual Needs Prayer;Grief support  Stress Factors  Patient Stress Factors Loss

## 2012-01-16 NOTE — Progress Notes (Signed)
Subjective: IOL for IUFD. Hx of prior cesarean x 1 pt having contractions that have dimished in intensity since 2 am.  Some pressure at introitus. slight perineal moisture no srom.  Objective: BP 122/62  Pulse 101  Temp(Src) 98.3 F (36.8 C) (Oral)  Resp 22  SpO2 97%  LMP 07/05/2011  using PCA infrequently   CBG now 130. Currently not on any diabetic meds FHT:   UC:   irregular, every 2 minutes, mainly irritability SVE:   Dilation: 2 Exam by:: Dr.Fergason Cx: 2/80/-2 with external balloon completely away from cervix, and internal balloon Labs: Lab Results  Component Value Date   WBC 13.2* 01/15/2012   HGB 11.0* 01/15/2012   HCT 33.7* 01/15/2012   MCV 81.2 01/15/2012   PLT 157 01/15/2012    Assessment / Plan: IOL for FDIU, progressing,  dmII stable CBG's less that 150 CHTN, controlled Labor: Progressing normally Preeclampsia:  no signs or symptoms of toxicity Fetal Wellbeing:  FDIU Pain Control:  dilaudid PCA I/D:  n/a Anticipated MOD:  NSVD  [name] V 01/16/2012, 5:01 AM

## 2012-01-17 ENCOUNTER — Inpatient Hospital Stay (HOSPITAL_COMMUNITY): Admission: RE | Admit: 2012-01-17 | Payer: BC Managed Care – PPO | Source: Ambulatory Visit

## 2012-01-17 MED ORDER — IBUPROFEN 600 MG PO TABS: 600.0000 mg | ORAL_TABLET | Freq: Four times a day (QID) | ORAL | Status: AC

## 2012-01-17 MED ORDER — PRENATAL MULTIVITAMIN CH: 1.0000 | ORAL_TABLET | Freq: Every day | ORAL | Status: DC

## 2012-01-17 NOTE — Discharge Summary (Signed)
Obstetric Discharge Summary Reason for Admission: induction of labor and IUFD at [redacted]w[redacted]d Prenatal Procedures: ultrasound Intrapartum Procedures: spontaneous vaginal delivery Postpartum Procedures: none Complications-Operative and Postpartum: none Hemoglobin  Date Value Range Status  01/15/2012 11.0* 12.0-15.0 (g/dL) Final     HCT  Date Value Range Status  01/15/2012 33.7* 36.0-46.0 (%) Final    Physical Exam:  General: alert, cooperative and no distress Lochia: appropriate Uterine Fundus: firm Incision: n/a DVT Evaluation: No evidence of DVT seen on physical exam.  Discharge Diagnoses: SVD at [redacted]w[redacted]d of Non Viable fetus  Discharge Information: Date: 01/17/2012 Activity: pelvic rest Diet: routine Medications: Ibuprofen Condition: stable Instructions: refer to practice specific booklet Discharge to: home Follow-up Information    Follow up with [facility]. (as scheduled)    Contact information:   [address] [place] [address]          Newborn Data: Non Viable female  Home with n/a.  Squire Withey E. 01/17/2012, 7:55 AM

## 2012-01-17 NOTE — Progress Notes (Signed)
UR Chart review completed.  

## 2012-01-17 NOTE — Discharge Instructions (Signed)
Intrauterine Fetal Demise  About one percent of normal, uncomplicated pregnancies end in fetal death (intrauterine fetal demise, IUFD). It is considered a fetal death when it occurs after the 20th week of pregnancy. It is considered a miscarriage when a fetal death occurs in the first 20 weeks. The mother's health is usually not in danger. Usually, there is nothing that can be done to prevent it.  CAUSES  · Often the cause is unknown.  · Examination of the stillborn fetus after delivery may show an abnormality in the umbilical cord. An exam my also show a problem with the placenta or fetus. These problems may include infections or a variety of birth defects and genetic disorders.  · The pregnancy continues for 42 weeks or later (post term pregnancy).  · Conditions in the mother such as diabetes, high blood pressure, and numerous other medical, physical or poor lifestyle choices (illegal drugs, alcohol, smoking) increase the risk for fetal death. Often, however, risk factors are unknown.  · Multiple pregnancies (twins or more) increase the risk of fetal death.  SYMPTOMS   · The mother may not notice symptoms in the early stages of pregnancy. Learning what is wrong (diagnosis) is based on:  · The loss of baby's heart sounds.  · The lack of increasing belly (abdominal) growth.  · Ultrasound studies which suggest death of the fetus.  · In later stages of pregnancy, a woman may be aware of changes in the fetal movement (kicks), or that the movement has stopped.  RELATED COMPLICATIONS  · Disseminated intravascular coagulation (DIC) is a problem with blood clotting. This can result in severe bleeding and rarely develops late after fetal death.  · Infection of the products of the pregnancy (fetal materials).  · Increase bleeding from retained fetal parts or placenta.  TREATMENT   · Treatment should be accomplished within 2 weeks of the discovered fetal death.  · To confirm the fetal death, diagnostic tests are done such  as:  · X-rays.  · Ultrasound.  · Amniotic fluid studies (looking at the fluid in the sac surrounding the baby).  · Most women, on learning that their fetus is dead, prefer early removal of the contents of the womb (uterus). In the first three months of pregnancy (first trimester), this is usually done by D and C or with suction curettage. Suction curettage is a technique used to remove the dead fetus and other tissue of the pregnancy from the uterus. It uses an instrument somewhat like a straw, connected by tubing to a machine, that your caregiver uses to suck out the dead contents of the uterus. NOTE: Suction curettage may be done in the second and third trimester after delivery of the dead fetus only to make sure there is no placental tissue left in the uterus but not to suction out the fetus.  · In the second trimester, treatment is more frequently accomplished with high doses of a drug, prostaglandin E (Prostin) suppositories or in combination with laminaria (as specialized seaweed product that absorbs moisture and expands to gradually stretch and open the cervix). Prostin (T) causes labor to start.  · In the third trimester, it may be accomplished with laminaria and misoprostol vaginal suppositories to induce labor. It may also be done with the drugs intravenous oxytocin plus prostaglandin E.  · If there was an infection involved with the fetal death, you will be given an antibiotic. You will be given Rho-gam if you are Rh negative and the baby is   Rh positive (a vaccine to prevent Rh problems with a future pregnancy). An additional treatment option is to wait for spontaneous labor, which usually occurs within 2 weeks, but may be longer. This is called expectant therapy.  · Following removal of the products of the pregnancy, the stillborn fetus is usually examined by a specialist (pathologist) to determine if problems are present that may reoccur in another pregnancy. This can help plan future pregnancies. That  planning will also include treatment which will best guarantee a good outcome in future pregnancies.  · Your caregiver can also help you deal with feelings of loss, guilt, loneliness, anxiety, and hostility. Family and friends can be helpful. If severe grief lasts longer than several months, professional counseling may be helpful. Joining a grief support group may be useful.  · Any medicines prescribed will depend on the type of treatment received.  Other problems can be cared for with your caregivers. There may be discussions on whether or not to see, touch or photograph the infant, whether to name the infant, what to do with the remains (burial or cremation), and holding religious services.  HOME CARE INSTRUCTIONS   · Restrictions are usually not necessary unless associated with the delivery choice.  · Sexual intercourse should be avoided for 4 to 6 weeks. Starting another pregnancy should be delayed several months, or as suggested by your caregiver.  · Do not use tampons or douche.  · Only take over-the-counter or prescription medicines for pain, discomfort, or fever as directed by your caregiver. Do not take aspirin it can cause you to bleed. Call your caregiver for a prescription for stronger pain medication if you need it.  · No special diet is necessary unless you have diabetes or other medical problems that require a special diet.  · Take showers instead of baths until your caregiver tells you it is okay.  · Ask your caregiver when you can return to driving and to your everyday activities.  · Make an appointment with your caregiver for follow up care.  PREVENTION   · Eliminate any of the causes, if possible, that were found after evaluating the fetus.  · Control any medical problems you may have before or during the pregnancy.  · Avoid illegal drugs, alcohol and smoking.  · Maintain good prenatal care and follow your caregiver's treatment and advice.  · Report any concerns or unusual changes you notice  during your pregnancy.  · More frequent prenatal visits may be necessary with the next child.  SEEK MEDICAL CARE IF:   · You develop abnormal vaginal discharge.  · You develop a temperature 102° F (38.9° C) or higher.  · You are getting dizzy and faint.  · You are feeling depressed.  SEEK IMMEDIATE MEDICAL CARE IF:   During pregnancy:  · You fail to gain weight, or your abdomen is not increasing in size.  · Your unborn child appears to have less movement or stopped moving. Keep your medical conditions under control.  After delivery:  · You have heavy vaginal bleeding.  · You have chills and fever.  · You have chest pain.  · You have shortness of breath.  · You have pain or swelling or redness of your leg.  · Following the death of a fetus, you or a family member need help or emotional support in coping with the grief process.  MAKE SURE YOU:   · Understand these instructions.  · Will watch your condition.  · Will get   help right away if you are not doing well or get worse.  Document Released: 07/29/2005 Document Revised: 07/18/2011 Document Reviewed: 04/23/2007  [employer]® Patient Information ©2012 [employer].

## 2012-01-17 NOTE — Progress Notes (Signed)
Pt is discharged in the care of husband. Downstairs per ambulation. Emotional support given due to lost. Stable. Denies any pain or discomfort., or heavy vaginal bleeding. Pt able to verbalize fears and anxities about lost.

## 2012-01-22 ENCOUNTER — Other Ambulatory Visit: Payer: Self-pay | Admitting: Family Medicine

## 2012-01-22 ENCOUNTER — Encounter (HOSPITAL_COMMUNITY): Payer: Self-pay | Admitting: *Deleted

## 2012-01-23 ENCOUNTER — Other Ambulatory Visit: Payer: Self-pay | Admitting: Family Medicine

## 2012-01-23 NOTE — Discharge Summary (Signed)
Agree with above note.  [name] H. 01/23/2012 4:01 PM

## 2012-02-04 ENCOUNTER — Ambulatory Visit (INDEPENDENT_AMBULATORY_CARE_PROVIDER_SITE_OTHER): Payer: BC Managed Care – PPO | Admitting: Family Medicine

## 2012-02-04 ENCOUNTER — Encounter: Payer: Self-pay | Admitting: Family Medicine

## 2012-02-04 VITALS — BP 128/80 | HR 80 | Resp 18 | Ht 62.0 in | Wt 186.0 lb

## 2012-02-04 DIAGNOSIS — G4700 Insomnia, unspecified: Secondary | ICD-10-CM | POA: Insufficient documentation

## 2012-02-04 DIAGNOSIS — Z13220 Encounter for screening for lipoid disorders: Secondary | ICD-10-CM

## 2012-02-04 DIAGNOSIS — E119 Type 2 diabetes mellitus without complications: Secondary | ICD-10-CM

## 2012-02-04 DIAGNOSIS — I10 Essential (primary) hypertension: Secondary | ICD-10-CM

## 2012-02-04 DIAGNOSIS — IMO0002 Reserved for concepts with insufficient information to code with codable children: Secondary | ICD-10-CM

## 2012-02-04 DIAGNOSIS — E669 Obesity, unspecified: Secondary | ICD-10-CM

## 2012-02-04 DIAGNOSIS — F4321 Adjustment disorder with depressed mood: Secondary | ICD-10-CM | POA: Insufficient documentation

## 2012-02-04 MED ORDER — TEMAZEPAM 15 MG PO CAPS: 15.0000 mg | ORAL_CAPSULE | Freq: Every evening | ORAL | Status: DC | PRN

## 2012-02-04 MED ORDER — HYDROCHLOROTHIAZIDE 25 MG PO TABS: 25.0000 mg | ORAL_TABLET | Freq: Every day | ORAL | Status: DC

## 2012-02-04 NOTE — Assessment & Plan Note (Signed)
Controlled, no change in medication  

## 2012-02-04 NOTE — Progress Notes (Signed)
  Subjective:    Patient ID: [name], female    DOB: [birth year], 36 y.o.   MRN: [id number]  HPI Pt in for follow up. She lost a 24 week baby boy on 6/24 for unknown reasons,having poor sleep and excessive crying, states she has good support, is improving, not suicidal or homicidal, not interested in medication but in therapy, she will take med to help with sleep however. Blood sugar and blood pressure was good in pregnancy  Review of Systems See HPI Denies recent fever or chills. Denies sinus pressure, nasal congestion, ear pain or sore throat. Denies chest congestion, productive cough or wheezing. Denies chest pains, palpitations and leg swelling Denies abdominal pain, nausea, vomiting,diarrhea or constipation.   Denies dysuria, frequency, hesitancy or incontinence. Denies joint pain, swelling and limitation in mobility. Denies headaches, seizures, numbness, or tingling.  Denies skin break down or rash.        Objective:   Physical Exam  Patient alert and oriented and in no cardiopulmonary distress.  HEENT: No facial asymmetry, EOMI, no sinus tenderness,  oropharynx pink and moist.  Neck supple no adenopathy.  Chest: Clear to auscultation bilaterally.  CVS: S1, S2 no murmurs, no S3.  ABD: Soft non tender. Bowel sounds normal.  Ext: No edema  MS: Adequate ROM spine, shoulders, hips and knees.  Skin: Intact, no ulcerations or rash noted.  Psych: Good eye contact, normal affect. Memory intact not anxious or depressed appearing.  CNS: CN 2-12 intact, power, tone and sensation normal throughout.       Assessment & Plan:

## 2012-02-04 NOTE — Patient Instructions (Addendum)
F/u in 4 month, pls call if you need me before   Fasting lipid, chem 7 and EGFR, hBA1C and tSH in 4 month  New medication for use at night to help with sleep, and you will be provided with information on good sleep hygiene also.  After first month try and cut back on dependence on medication  You are referred to a therapist at [facility], they will call with appt  It is important that you exercise regularly at least 30 minutes 5 times a week. If you develop chest pain, have severe difficulty breathing, or feel very tired, stop exercising immediately and seek medical attention  A healthy diet is rich in fruit, vegetables and whole grains. Poultry fish, nuts and beans are a healthy choice for protein rather then red meat. A low sodium diet and drinking 64 ounces of water daily is generally recommended. Oils and sweet should be limited. Carbohydrates especially for those who are diabetic or overweight, should be limited to 30-45 gram per meal. It is important to eat on a regular schedule, at least 3 times daily. Snacks should be primarily fruits, vegetables or nuts.

## 2012-02-09 NOTE — Assessment & Plan Note (Signed)
New problem associated woith stillbirth, sleep hygiene discussed and med for short term use prescribed

## 2012-02-09 NOTE — Assessment & Plan Note (Signed)
Controlled, no change in medication  

## 2012-02-09 NOTE — Assessment & Plan Note (Signed)
Expected reaction to stillbirth, will benefit from therapy, she is referred

## 2012-02-10 DEATH — deceased

## 2012-02-27 ENCOUNTER — Ambulatory Visit (HOSPITAL_COMMUNITY): Payer: BC Managed Care – PPO | Admitting: Psychiatry

## 2012-03-03 ENCOUNTER — Other Ambulatory Visit: Payer: Self-pay | Admitting: Family Medicine

## 2012-03-03 ENCOUNTER — Telehealth: Payer: Self-pay | Admitting: Family Medicine

## 2012-03-03 MED ORDER — GLYBURIDE 5 MG PO TABS: 5.0000 mg | ORAL_TABLET | Freq: Two times a day (BID) | ORAL | Status: DC

## 2012-03-03 NOTE — Telephone Encounter (Signed)
Med sent in and she is aware of the increase

## 2012-03-03 NOTE — Telephone Encounter (Signed)
Since she lost the pregnancy, her sugars have been acting up. For the past few days her fasting readings have been 197, 183, and 151. These are much higher than they were running. She is taking her DM meds as directed. Wants to see what you recommend

## 2012-03-03 NOTE — Telephone Encounter (Signed)
Increase diabeta 2.5mg  to TWO twice daily, I am entering 5mg  one twice daily send after you spk with her pls

## 2012-03-04 ENCOUNTER — Other Ambulatory Visit: Payer: Self-pay | Admitting: Obstetrics and Gynecology

## 2012-03-09 ENCOUNTER — Other Ambulatory Visit: Payer: Self-pay | Admitting: Family Medicine

## 2012-03-12 DEATH — deceased

## 2012-03-16 ENCOUNTER — Other Ambulatory Visit: Payer: Self-pay

## 2012-03-16 MED ORDER — GLUCOSE BLOOD VI STRP: ORAL_STRIP | Status: DC

## 2012-05-08 ENCOUNTER — Inpatient Hospital Stay (HOSPITAL_COMMUNITY)
Admission: AD | Admit: 2012-05-08 | Payer: BC Managed Care – PPO | Source: Ambulatory Visit | Admitting: Obstetrics & Gynecology

## 2012-05-15 ENCOUNTER — Telehealth: Payer: Self-pay | Admitting: Family Medicine

## 2012-05-15 NOTE — Telephone Encounter (Signed)
rx sent to [facility] in [place]

## 2012-05-15 NOTE — Telephone Encounter (Signed)
Awaiting new script

## 2012-06-09 LAB — COMPLETE METABOLIC PANEL WITH GFR
Albumin: 4 g/dL (ref 3.5–5.2)
Alkaline Phosphatase: 68 U/L (ref 39–117)
BUN: 13 mg/dL (ref 6–23)
Calcium: 9.6 mg/dL (ref 8.4–10.5)
GFR, Est Non African American: 87 mL/min
Glucose, Bld: 100 mg/dL — ABNORMAL HIGH (ref 70–99)
Potassium: 5 mEq/L (ref 3.5–5.3)

## 2012-06-09 LAB — LIPID PANEL
HDL: 38 mg/dL — ABNORMAL LOW (ref >39)
Total CHOL/HDL Ratio: 4.1 Ratio

## 2012-06-09 LAB — HEMOGLOBIN A1C: Hgb A1c MFr Bld: 6.7 % — ABNORMAL HIGH (ref <5.7)

## 2012-06-10 ENCOUNTER — Ambulatory Visit (INDEPENDENT_AMBULATORY_CARE_PROVIDER_SITE_OTHER): Payer: BC Managed Care – PPO | Admitting: Family Medicine

## 2012-06-10 ENCOUNTER — Encounter: Payer: Self-pay | Admitting: Family Medicine

## 2012-06-10 VITALS — BP 118/74 | HR 76 | Resp 15 | Ht 62.0 in | Wt 189.0 lb

## 2012-06-10 DIAGNOSIS — I10 Essential (primary) hypertension: Secondary | ICD-10-CM

## 2012-06-10 DIAGNOSIS — E669 Obesity, unspecified: Secondary | ICD-10-CM

## 2012-06-10 DIAGNOSIS — E119 Type 2 diabetes mellitus without complications: Secondary | ICD-10-CM

## 2012-06-10 NOTE — Progress Notes (Signed)
  Subjective:    Patient ID: [name], female    DOB: 04-Jan-1976, 36 y.o.   MRN: [id number]  HPI The PT is here for follow up and re-evaluation of chronic medical conditions, medication management and review of any available recent lab and radiology data.  Preventive health is updated, specifically  Cancer screening and Immunization.   Questions or concerns regarding consultations or procedures which the PT has had in the interim are  addressed. The PT denies any adverse reactions to current medications since the last visit.  There are no new concerns.  There are no specific complaints       Review of Systems See HPI Denies recent fever or chills. Denies sinus pressure, nasal congestion, ear pain or sore throat. Denies chest congestion, productive cough or wheezing. Denies chest pains, palpitations and leg swelling Denies abdominal pain, nausea, vomiting,diarrhea or constipation.   Denies dysuria, frequency, hesitancy or incontinence. Denies joint pain, swelling and limitation in mobility. Denies headaches, seizures, numbness, or tingling. Denies depression, anxiety or insomnia. Denies skin break down or rash.        Objective:   Physical Exam  Patient alert and oriented and in no cardiopulmonary distress.  HEENT: No facial asymmetry, EOMI, no sinus tenderness,  oropharynx pink and moist.  Neck supple no adenopathy.  Chest: Clear to auscultation bilaterally.  CVS: S1, S2 no murmurs, no S3.  ABD: Soft non tender. Bowel sounds normal.  Ext: No edema  MS: Adequate ROM spine, shoulders, hips and knees.  Skin: Intact, no ulcerations or rash noted.  Psych: Good eye contact, normal affect. Memory intact not anxious or depressed appearing.  CNS: CN 2-12 intact, power, tone and sensation normal throughout. Diabetic Foot Check:  Appearance - no lesions, ulcers or calluses Skin - no unusual pallor or redness Sensation - grossly intact to light  touch Monofilament testing -  Right - Great toe, medial, central, lateral ball and posterior foot intact Left - Great toe, medial, central, lateral ball and posterior foot intact Pulses Left - Dorsalis Pedis and Posterior Tibia normal Right - Dorsalis Pedis and Posterior Tibia normal       Assessment & Plan:

## 2012-06-10 NOTE — Patient Instructions (Addendum)
F/U in 4 month  Weight loss goal of 6 to 8 pounds  Commit to walking 3 miles every day, this will improve sugar and help with weight loss  Also good cholesterol is low need to exercise more to improve this  No med changes.  Need to eat regularly  Please log in to your results at home

## 2012-06-15 NOTE — Assessment & Plan Note (Signed)
Unchanged. Patient re-educated about  the importance of commitment to a  minimum of 150 minutes of exercise per week. The importance of healthy food choices with portion control discussed. Encouraged to start a food diary, count calories and to consider  joining a support group. Sample diet sheets offered. Goals set by the patient for the next several months.    

## 2012-06-15 NOTE — Assessment & Plan Note (Signed)
Controlled, no change in medication Patient advised to reduce carb and sweets, commit to regular physical activity, take meds as prescribed, test blood as directed, and attempt to lose weight, to improve blood sugar control.  

## 2012-06-15 NOTE — Assessment & Plan Note (Addendum)
Controlled, no change in medication DASH diet and commitment to daily physical activity for a minimum of 30 minutes discussed and encouraged, as a part of hypertension management. The importance of attaining a healthy weight is also discussed.  

## 2012-07-13 ENCOUNTER — Other Ambulatory Visit: Payer: Self-pay | Admitting: Family Medicine

## 2012-08-27 ENCOUNTER — Ambulatory Visit (INDEPENDENT_AMBULATORY_CARE_PROVIDER_SITE_OTHER): Payer: BC Managed Care – PPO | Admitting: Family Medicine

## 2012-08-27 ENCOUNTER — Encounter: Payer: Self-pay | Admitting: Family Medicine

## 2012-08-27 ENCOUNTER — Other Ambulatory Visit: Payer: Self-pay | Admitting: Family Medicine

## 2012-08-27 VITALS — BP 120/80 | HR 89 | Resp 16 | Ht 62.0 in | Wt 192.0 lb

## 2012-08-27 DIAGNOSIS — N390 Urinary tract infection, site not specified: Secondary | ICD-10-CM

## 2012-08-27 DIAGNOSIS — I10 Essential (primary) hypertension: Secondary | ICD-10-CM

## 2012-08-27 DIAGNOSIS — K3189 Other diseases of stomach and duodenum: Secondary | ICD-10-CM

## 2012-08-27 DIAGNOSIS — R1013 Epigastric pain: Secondary | ICD-10-CM

## 2012-08-27 DIAGNOSIS — N926 Irregular menstruation, unspecified: Secondary | ICD-10-CM

## 2012-08-27 DIAGNOSIS — R109 Unspecified abdominal pain: Secondary | ICD-10-CM

## 2012-08-27 MED ORDER — HYOSCYAMINE SULFATE ER 0.375 MG PO TB12: 0.3750 mg | ORAL_TABLET | Freq: Two times a day (BID) | ORAL | Status: DC | PRN

## 2012-08-27 MED ORDER — PANTOPRAZOLE SODIUM 20 MG PO TBEC: 20.0000 mg | DELAYED_RELEASE_TABLET | Freq: Every day | ORAL | Status: DC

## 2012-08-27 NOTE — Patient Instructions (Addendum)
F/u as before  Chem7, hepatic , amylase and lipase evaluate abdominal pain, draw today.H pylori today  I will test for gall stones and see if symptoms are due to bacterial infection that can cause heartburn  Please get abdominal xray today.  Medication is started for cramping abdominal pain, pls fill at the pharmacy  You are referred for ultrasound of gall bladder to evaluate for gall stones.  Urine shows no abnormality and you are not pregnant

## 2012-08-27 NOTE — Progress Notes (Signed)
  Subjective:    Patient ID: [name], female    DOB: 19-Jul-1976, 37 y.o.   MRN: [id number]  HPI 3 week h/o intermittent cramping abdominal pain rated at a 10, no nausea or vomiting, has lighthheaded feeling  Feels spasms, no nausea or vomiting, bowel movements unchanged, no visible blood in urine, menses reported as regular No fever or chills   Review of Systems See HPI Denies recent fever or chills. Denies sinus pressure, nasal congestion, ear pain or sore throat. Denies chest congestion, productive cough or wheezing. Denies chest pains, palpitations and leg swelling Denies nausea, vomiting,diarrhea or constipation.   Denies dysuria, frequency, hesitancy or incontinence.         Objective:   Physical Exam Patient alert and oriented and in no cardiopulmonary distress.  HEENT: No facial asymmetry, EOMI, no sinus tenderness,  oropharynx pink and moist.  Neck supple no adenopathy.  Chest: Clear to auscultation bilaterally.  CVS: S1, S2 no murmurs, no S3.  ABD: Soft RUQ tenderness, no guarding or rebound, no palpable organomegaly or mass Bowel sounds normal  Ext: No edema  MS: Adequate ROM spine, shoulders, hips and knees.  Skin: Intact, no ulcerations or rash noted.  Psych: Good eye contact, normal affect. Memory intact not anxious or depressed appearing.  CNS: CN 2-12 intact, power, tone and sensation normal throughout.        Assessment & Plan:

## 2012-08-28 LAB — POCT URINALYSIS DIPSTICK
Bilirubin, UA: NEGATIVE
Blood, UA: NEGATIVE
Glucose, UA: NEGATIVE
Ketones, UA: NEGATIVE
Nitrite, UA: NEGATIVE
Spec Grav, UA: 1.025
pH, UA: 6.5

## 2012-08-28 LAB — BASIC METABOLIC PANEL
BUN: 11 mg/dL (ref 6–23)
Calcium: 9.2 mg/dL (ref 8.4–10.5)
Creat: 0.89 mg/dL (ref 0.50–1.10)
Potassium: 3.4 mEq/L — ABNORMAL LOW (ref 3.5–5.3)

## 2012-08-28 LAB — HEPATIC FUNCTION PANEL
ALT: 13 U/L (ref 0–35)
AST: 12 U/L (ref 0–37)
Albumin: 3.9 g/dL (ref 3.5–5.2)
Alkaline Phosphatase: 69 U/L (ref 39–117)
Indirect Bilirubin: 0.3 mg/dL (ref 0.0–0.9)
Total Protein: 6.6 g/dL (ref 6.0–8.3)

## 2012-08-29 DIAGNOSIS — N390 Urinary tract infection, site not specified: Secondary | ICD-10-CM | POA: Insufficient documentation

## 2012-08-29 DIAGNOSIS — N926 Irregular menstruation, unspecified: Secondary | ICD-10-CM | POA: Insufficient documentation

## 2012-08-29 NOTE — Assessment & Plan Note (Signed)
Controlled, no change in medication  

## 2012-08-29 NOTE — Assessment & Plan Note (Signed)
Acute intermittent pain, check UA to ensure no hematuria also preg test. Symptoms suggestive of gall bladder disease will order studie to follow up on this.also h pylori test

## 2012-08-29 NOTE — Assessment & Plan Note (Signed)
Abdominal pain with some frequency, UA negative for any abnormality

## 2012-08-29 NOTE — Assessment & Plan Note (Signed)
H/o irreg nmenses, recently normalizing, check for pregnancy in light of severe abdominal pain

## 2012-08-31 ENCOUNTER — Other Ambulatory Visit: Payer: Self-pay | Admitting: Family Medicine

## 2012-08-31 LAB — H. PYLORI ANTIBODY, IGG: H Pylori IgG: 0.57 {ISR}

## 2012-09-02 ENCOUNTER — Other Ambulatory Visit: Payer: Self-pay

## 2012-09-02 ENCOUNTER — Encounter: Payer: Self-pay | Admitting: Family Medicine

## 2012-09-02 ENCOUNTER — Ambulatory Visit (HOSPITAL_COMMUNITY)
Admission: RE | Admit: 2012-09-02 | Discharge: 2012-09-02 | Disposition: A | Discharge status: Home / Self Care | Payer: BC Managed Care – PPO | Source: Ambulatory Visit | Attending: Family Medicine | Admitting: Family Medicine

## 2012-09-02 ENCOUNTER — Other Ambulatory Visit: Payer: Self-pay | Admitting: Family Medicine

## 2012-09-02 DIAGNOSIS — R1011 Right upper quadrant pain: Secondary | ICD-10-CM

## 2012-09-02 DIAGNOSIS — R109 Unspecified abdominal pain: Secondary | ICD-10-CM | POA: Insufficient documentation

## 2012-09-02 DIAGNOSIS — K7689 Other specified diseases of liver: Secondary | ICD-10-CM | POA: Insufficient documentation

## 2012-09-02 MED ORDER — POTASSIUM CHLORIDE CRYS ER 20 MEQ PO TBCR: 20.0000 meq | EXTENDED_RELEASE_TABLET | Freq: Two times a day (BID) | ORAL | Status: DC

## 2012-09-03 ENCOUNTER — Telehealth: Payer: Self-pay | Admitting: Family Medicine

## 2012-09-03 ENCOUNTER — Encounter: Payer: Self-pay | Admitting: Family Medicine

## 2012-09-03 NOTE — Telephone Encounter (Signed)
Patient aware.

## 2012-09-07 ENCOUNTER — Encounter (HOSPITAL_COMMUNITY): Admission: RE | Admit: 2012-09-07 | Payer: BC Managed Care – PPO | Source: Ambulatory Visit

## 2012-09-14 ENCOUNTER — Encounter: Payer: Self-pay | Admitting: Family Medicine

## 2012-09-16 ENCOUNTER — Encounter (HOSPITAL_COMMUNITY)
Admission: RE | Admit: 2012-09-16 | Discharge: 2012-09-16 | Disposition: A | Discharge status: Home / Self Care | Payer: BC Managed Care – PPO | Source: Ambulatory Visit | Attending: Family Medicine | Admitting: Family Medicine

## 2012-09-16 ENCOUNTER — Encounter (HOSPITAL_COMMUNITY): Payer: Self-pay

## 2012-09-16 DIAGNOSIS — R932 Abnormal findings on diagnostic imaging of liver and biliary tract: Secondary | ICD-10-CM | POA: Insufficient documentation

## 2012-09-16 DIAGNOSIS — R109 Unspecified abdominal pain: Secondary | ICD-10-CM | POA: Insufficient documentation

## 2012-09-16 DIAGNOSIS — R1011 Right upper quadrant pain: Secondary | ICD-10-CM

## 2012-09-16 MED ORDER — TECHNETIUM TC 99M MEBROFENIN IV KIT
5.0000 | PACK | Freq: Once | INTRAVENOUS | Status: AC | PRN
  Administered 2012-09-16: 4.8 via INTRAVENOUS

## 2012-09-17 ENCOUNTER — Encounter: Payer: Self-pay | Admitting: Family Medicine

## 2012-09-18 ENCOUNTER — Emergency Department (HOSPITAL_COMMUNITY)
Admission: EM | Admit: 2012-09-18 | Discharge: 2012-09-19 | Disposition: A | Discharge status: Home / Self Care | Payer: BC Managed Care – PPO | Attending: Emergency Medicine | Admitting: Emergency Medicine

## 2012-09-18 ENCOUNTER — Encounter (HOSPITAL_COMMUNITY): Payer: Self-pay | Admitting: *Deleted

## 2012-09-18 DIAGNOSIS — E119 Type 2 diabetes mellitus without complications: Secondary | ICD-10-CM | POA: Insufficient documentation

## 2012-09-18 DIAGNOSIS — I10 Essential (primary) hypertension: Secondary | ICD-10-CM | POA: Insufficient documentation

## 2012-09-18 DIAGNOSIS — Z79899 Other long term (current) drug therapy: Secondary | ICD-10-CM | POA: Insufficient documentation

## 2012-09-18 DIAGNOSIS — R110 Nausea: Secondary | ICD-10-CM | POA: Insufficient documentation

## 2012-09-18 DIAGNOSIS — Z8719 Personal history of other diseases of the digestive system: Secondary | ICD-10-CM | POA: Insufficient documentation

## 2012-09-18 DIAGNOSIS — K829 Disease of gallbladder, unspecified: Secondary | ICD-10-CM

## 2012-09-18 DIAGNOSIS — K219 Gastro-esophageal reflux disease without esophagitis: Secondary | ICD-10-CM | POA: Insufficient documentation

## 2012-09-18 DIAGNOSIS — M549 Dorsalgia, unspecified: Secondary | ICD-10-CM | POA: Insufficient documentation

## 2012-09-18 DIAGNOSIS — R109 Unspecified abdominal pain: Secondary | ICD-10-CM

## 2012-09-18 DIAGNOSIS — R12 Heartburn: Secondary | ICD-10-CM | POA: Insufficient documentation

## 2012-09-18 LAB — CBC WITH DIFFERENTIAL/PLATELET
Eosinophils Relative: 2 % (ref 0–5)
HCT: 37.1 % (ref 36.0–46.0)
Hemoglobin: 12 g/dL (ref 12.0–15.0)
Lymphocytes Relative: 31 % (ref 12–46)
MCHC: 32.3 g/dL (ref 30.0–36.0)
MCV: 82.1 fL (ref 78.0–100.0)
Monocytes Absolute: 0.7 10*3/uL (ref 0.1–1.0)
Monocytes Relative: 7 % (ref 3–12)
Neutro Abs: 6.1 10*3/uL (ref 1.7–7.7)
WBC: 10.3 10*3/uL (ref 4.0–10.5)

## 2012-09-18 LAB — URINALYSIS, ROUTINE W REFLEX MICROSCOPIC
Glucose, UA: NEGATIVE mg/dL
Hgb urine dipstick: NEGATIVE
Ketones, ur: NEGATIVE mg/dL
Leukocytes, UA: NEGATIVE
pH: 6 (ref 5.0–8.0)

## 2012-09-18 LAB — COMPREHENSIVE METABOLIC PANEL
BUN: 11 mg/dL (ref 6–23)
CO2: 32 mEq/L (ref 19–32)
Chloride: 95 mEq/L — ABNORMAL LOW (ref 96–112)
Creatinine, Ser: 0.87 mg/dL (ref 0.50–1.10)
GFR calc Af Amer: >90 mL/min (ref >90)
GFR calc non Af Amer: 85 mL/min — ABNORMAL LOW (ref >90)
Total Bilirubin: 0.2 mg/dL — ABNORMAL LOW (ref 0.3–1.2)

## 2012-09-18 LAB — LIPASE, BLOOD: Lipase: 35 U/L (ref 11–59)

## 2012-09-18 MED ORDER — HYDROCODONE-ACETAMINOPHEN 5-325 MG PO TABS: 2.0000 | ORAL_TABLET | ORAL | Status: AC | PRN

## 2012-09-18 MED ORDER — ONDANSETRON HCL 4 MG/2ML IJ SOLN
4.0000 mg | Freq: Once | INTRAMUSCULAR | Status: AC
  Administered 2012-09-18: 4 mg via INTRAVENOUS
  Filled 2012-09-18: qty 2

## 2012-09-18 MED ORDER — HYDROMORPHONE HCL PF 1 MG/ML IJ SOLN
1.0000 mg | Freq: Once | INTRAMUSCULAR | Status: AC
  Administered 2012-09-18: 1 mg via INTRAVENOUS
  Filled 2012-09-18: qty 1

## 2012-09-18 MED ORDER — ONDANSETRON HCL 4 MG PO TABS: 4.0000 mg | ORAL_TABLET | Freq: Four times a day (QID) | ORAL | Status: DC

## 2012-09-18 MED ORDER — SODIUM CHLORIDE 0.9 % IV SOLN
Freq: Once | INTRAVENOUS | Status: AC
  Administered 2012-09-18: 20 mL/h via INTRAVENOUS

## 2012-09-18 MED ORDER — OMEPRAZOLE 20 MG PO CPDR: 20.0000 mg | DELAYED_RELEASE_CAPSULE | Freq: Every day | ORAL | Status: DC

## 2012-09-18 MED ORDER — FAMOTIDINE IN NACL 20-0.9 MG/50ML-% IV SOLN
20.0000 mg | Freq: Once | INTRAVENOUS | Status: AC
  Administered 2012-09-18: 20 mg via INTRAVENOUS
  Filled 2012-09-18: qty 50

## 2012-09-18 NOTE — ED Notes (Signed)
States she was contacted by her PCP and told she will refer her to a surgeon Lovell Sheehan) , as her gallbladder is not working. States had increase in pain after scan done but it subsided and tonight pain has returned and is much worse.  Undressed and prepared for exam

## 2012-09-18 NOTE — ED Notes (Signed)
V/s recheck completed

## 2012-09-18 NOTE — ED Notes (Signed)
abd pain, nausea,no vomiting, pain radiating to  Rt side.

## 2012-09-18 NOTE — ED Provider Notes (Signed)
History     CSN: [id number]  Arrival date & time 09/18/12  2057   None     Chief Complaint  Patient presents with  . Abdominal Pain    (Consider location/radiation/quality/duration/timing/severity/associated sxs/prior treatment) Patient is a 37 y.o. female presenting with abdominal pain. The history is provided by the patient. No language interpreter was used.  Abdominal Pain The primary symptoms of the illness include abdominal pain. Episode onset: 3 weeks. The onset of the illness was gradual. The problem has been gradually worsening.  Associated with: nothing. The patient states that she believes she is currently not pregnant. The patient has had a change in bowel habit. Risk factors: overweight. Additional symptoms associated with the illness include heartburn and back pain. Significant associated medical issues include gallstones. Significant associated medical issues do not include PUD.    Past Medical History  Diagnosis Date  . GERD (gastroesophageal reflux disease)   . Hypertension   . Constipation   . Morbid obesity   . NIDDM (non-insulin dependent diabetes mellitus) 2005    no complications , takes POs when not pregnant    Past Surgical History  Procedure Date  . Cesarean section 6/09  . Back surgery 05/25/2010    Dr,Kabell     Family History  Problem Relation Age of Onset  . Diabetes Father   . Hypertension Father   . Kidney disease Father   . Kidney failure Father   . Diabetes Mother   . Hypertension Mother     History  Substance Use Topics  . Smoking status: Never Smoker   . Smokeless tobacco: Never Used  . Alcohol Use: No    OB History    Grav Para Term Preterm Abortions TAB SAB Ect Mult Living   4 2 1 1 2  2   1       Review of Systems  Gastrointestinal: Positive for heartburn and abdominal pain.  Musculoskeletal: Positive for back pain.  All other systems reviewed and are negative.    Allergies  Ace inhibitors and Amlodipine  besylate  Home Medications   Current Outpatient Rx  Name  Route  Sig  Dispense  Refill  . GLUCOSE BLOOD VI STRP      Once daily testing 250.00   50 each   12   . GLYBURIDE 5 MG PO TABS   Oral   Take 1 tablet (5 mg total) by mouth 2 (two) times daily with a meal.   60 tablet   11     Dose increase effective 03/03/2012   . HYDROCHLOROTHIAZIDE 25 MG PO TABS   Oral   Take 1 tablet (25 mg total) by mouth daily.   90 tablet   3   . HYOSCYAMINE SULFATE ER 0.375 MG PO TB12   Oral   Take 1 tablet (0.375 mg total) by mouth every 12 (twelve) hours as needed for cramping.   60 tablet   0   . JANUMET 50-1000 MG PO TABS      TAKE ONE TABLET BY MOUTH TWICE DAILY   60 tablet   3   . MEDROXYPROGESTERONE ACETATE PO   Oral   Take 10 mg by mouth. Take one tablet by mouth at bedtime for 10 days every 2 months          . ONETOUCH DELICA LANCETS MISC      Once daily testing   100 each   5   . PANTOPRAZOLE SODIUM 20 MG PO TBEC  Oral   Take 1 tablet (20 mg total) by mouth daily.   30 tablet   1   . POTASSIUM CHLORIDE CRYS ER 20 MEQ PO TBCR   Oral   Take 1 tablet (20 mEq total) by mouth 2 (two) times daily.   60 tablet   5   . PRENATAL MULTIVITAMIN CH   Oral   Take 1 tablet by mouth daily.         Marland Kitchen PROPRANOLOL HCL 40 MG PO TABS      TAKE ONE TABLET BY MOUTH TWICE DAILY   60 tablet   3   . TEMAZEPAM 15 MG PO CAPS   Oral   Take 1 capsule (15 mg total) by mouth at bedtime as needed for sleep.   30 capsule   2     BP 127/80  Pulse 80  Temp 98.6 F (37 C) (Oral)  Resp 20  Ht 5\' 2"  (1.575 m)  Wt 190 lb (86.183 kg)  BMI 34.75 kg/m2  SpO2 100%  LMP 08/08/2012  Physical Exam  Nursing note and vitals reviewed. Constitutional: She is oriented to person, place, and time. She appears well-developed and well-nourished.  HENT:  Head: Normocephalic.  Right Ear: External ear normal.  Left Ear: External ear normal.  Nose: Nose normal.  Mouth/Throat:  Oropharynx is clear and moist.  Eyes: Conjunctivae normal and EOM are normal. Pupils are equal, round, and reactive to light.  Neck: Normal range of motion. Neck supple.  Cardiovascular: Normal rate, regular rhythm and normal heart sounds.   Pulmonary/Chest: Effort normal and breath sounds normal.  Abdominal: Soft. There is tenderness.  Neurological: She is alert and oriented to person, place, and time. She has normal reflexes.  Skin: Skin is warm.  Psychiatric: She has a normal mood and affect.    ED Course  Procedures (including critical care time)  Labs Reviewed - No data to display No results found.   1. Abdominal pain   2. Gallbladder problem       MDM  I reviewed nuclear medicine scan.   Gallbladder Ef is low at 29.    Pt's symptoms consistent with gallbladder dysfunction.   Pt reports relief of symptoms with pepcid, dilaudid and zofran.    Pt advised she needs to follow up with general surgery.   Pt given rx for hydrocodone, zofran and prilosec.           Lonia Skinner [place], [place] 09/18/12 2354  Lonia Skinner [place], [place] 09/18/12 [phone]

## 2012-09-19 ENCOUNTER — Other Ambulatory Visit: Payer: Self-pay | Admitting: Family Medicine

## 2012-09-19 DIAGNOSIS — K829 Disease of gallbladder, unspecified: Secondary | ICD-10-CM

## 2012-09-19 DIAGNOSIS — R109 Unspecified abdominal pain: Secondary | ICD-10-CM

## 2012-09-19 NOTE — ED Provider Notes (Signed)
Medical screening examination/treatment/procedure(s) were performed by non-physician practitioner and as supervising physician I was immediately available for consultation/collaboration.  Fionnuala Hemmerich L Alysha Doolan, MD 09/19/12 1002 

## 2012-10-07 LAB — HEMOGLOBIN A1C: Hgb A1c MFr Bld: 7.6 % — ABNORMAL HIGH (ref <5.7)

## 2012-10-08 ENCOUNTER — Encounter: Payer: Self-pay | Admitting: Family Medicine

## 2012-10-08 ENCOUNTER — Telehealth: Payer: Self-pay | Admitting: Family Medicine

## 2012-10-08 ENCOUNTER — Ambulatory Visit (INDEPENDENT_AMBULATORY_CARE_PROVIDER_SITE_OTHER): Payer: BC Managed Care – PPO | Admitting: Family Medicine

## 2012-10-08 ENCOUNTER — Other Ambulatory Visit: Payer: Self-pay

## 2012-10-08 VITALS — BP 120/82 | HR 71 | Resp 16 | Ht 62.0 in | Wt 194.0 lb

## 2012-10-08 DIAGNOSIS — E669 Obesity, unspecified: Secondary | ICD-10-CM

## 2012-10-08 DIAGNOSIS — K219 Gastro-esophageal reflux disease without esophagitis: Secondary | ICD-10-CM

## 2012-10-08 DIAGNOSIS — R5381 Other malaise: Secondary | ICD-10-CM

## 2012-10-08 DIAGNOSIS — IMO0001 Reserved for inherently not codable concepts without codable children: Secondary | ICD-10-CM

## 2012-10-08 DIAGNOSIS — I10 Essential (primary) hypertension: Secondary | ICD-10-CM

## 2012-10-08 MED ORDER — INSULIN GLARGINE 100 UNIT/ML ~~LOC~~ SOLN: SUBCUTANEOUS | Status: DC

## 2012-10-08 MED ORDER — INSULIN PEN NEEDLE 29G X 12.7MM MISC: Status: DC

## 2012-10-08 MED ORDER — INSULIN PEN NEEDLE 31G X 8 MM MISC: 1.0000 [IU] | Status: DC

## 2012-10-08 NOTE — Progress Notes (Signed)
  Subjective:    Patient ID: [name], female    DOB: 03/13/1976, 37 y.o.   MRN: [id number]  HPI The PT is here for follow up and re-evaluation of chronic medical conditions, medication management and review of any available recent lab and radiology data.  Preventive health is updated, specifically  Cancer screening and Immunization.   Questions or concerns regarding consultations or procedures which the PT has had in the interim are  Addressed.Cancelled surgical eval for abdominal pain due to poorly functioning gall bladder since her symptoms spontaneously improved The PT denies any adverse reactions to current medications since the last visit.  Concerned due to elevated blood sugar, has usewd insulin in the past and is comfortable with this if she is able to afford the med     Review of Systems See HPI Denies recent fever or chills. Denies sinus pressure, nasal congestion, ear pain or sore throat. Denies chest congestion, productive cough or wheezing. Denies chest pains, palpitations and leg swelling Denies abdominal pain, nausea, vomiting,diarrhea or constipation.   Denies dysuria, frequency, hesitancy or incontinence. Denies joint pain, swelling and limitation in mobility. Denies headaches, seizures, numbness, or tingling. Denies depression, anxiety or insomnia. Denies skin break down or rash.        Objective:   Physical Exam Patient alert and oriented and in no cardiopulmonary distress.  HEENT: No facial asymmetry, EOMI, no sinus tenderness,  oropharynx pink and moist.  Neck supple no adenopathy.  Chest: Clear to auscultation bilaterally.  CVS: S1, S2 no murmurs, no S3.  ABD: Soft non tender. Bowel sounds normal.  Ext: No edema  MS: Adequate ROM spine, shoulders, hips and knees.  Skin: Intact, no ulcerations or rash noted.  Psych: Good eye contact, normal affect. Memory intact not anxious or depressed appearing.  CNS: CN 2-12 intact, power, tone and  sensation normal throughout.        Assessment & Plan:

## 2012-10-08 NOTE — Patient Instructions (Addendum)
F/u in 3.5 month  Blood sugar is uncontrolled.  Stop glyburide and continue janumet as before.  New is lantus , inject once daily before breakfast Start with 10 units every morning. Increase units every 3 days by 3 unitd if your blood sugar average for the previous 3 days is over 130. When the blood sugar average remains around 130, then stay at that dose. Script is written for 35 units, gradually increase as described to dose you need. You will get a coupon for the insulin call if unable to afford so med can be changed.  Please attend diabetic class.  OK to test up to 3 times daily on insulin, if you are not on insulin only once. After we know that you have filled the insulin and are using new testing orders will be sent in, pLEASE cALLL bAcK tODAY so we know  Goal for fasting blood sugar ranges from 80 to 120 and 2 hours after any meal or at bedtime should be between 130 to 170. Call with questions about your sugar please  Non fasting HBA1C , chem 7 and EGFR in 3.5 month

## 2012-10-08 NOTE — Telephone Encounter (Signed)
Sent in

## 2012-10-09 ENCOUNTER — Other Ambulatory Visit: Payer: Self-pay

## 2012-10-09 DIAGNOSIS — I10 Essential (primary) hypertension: Secondary | ICD-10-CM

## 2012-10-09 LAB — MICROALBUMIN / CREATININE URINE RATIO
Creatinine, Urine: 121 mg/dL
Microalb, Ur: 0.67 mg/dL (ref 0.00–1.89)

## 2012-10-09 MED ORDER — SITAGLIPTIN PHOS-METFORMIN HCL 50-1000 MG PO TABS: 1.0000 | ORAL_TABLET | Freq: Two times a day (BID) | ORAL | Status: DC

## 2012-10-09 MED ORDER — HYDROCHLOROTHIAZIDE 25 MG PO TABS: 25.0000 mg | ORAL_TABLET | Freq: Every day | ORAL | Status: DC

## 2012-10-09 MED ORDER — OMEPRAZOLE 20 MG PO CPDR: 20.0000 mg | DELAYED_RELEASE_CAPSULE | Freq: Every day | ORAL | Status: DC

## 2012-10-09 MED ORDER — PROPRANOLOL HCL 40 MG PO TABS: 40.0000 mg | ORAL_TABLET | Freq: Two times a day (BID) | ORAL | Status: DC

## 2012-10-11 NOTE — Assessment & Plan Note (Signed)
Controlled, no change in medication DASH diet and commitment to daily physical activity for a minimum of 30 minutes discussed and encouraged, as a part of hypertension management. The importance of attaining a healthy weight is also discussed.  

## 2012-10-11 NOTE — Assessment & Plan Note (Signed)
Uncontrolled and deteriorated, start lantus and continue janumet Pt also encouraged to attend diabetic class to help with diet

## 2012-10-11 NOTE — Assessment & Plan Note (Signed)
Deteriorated. Patient re-educated about  the importance of commitment to a  minimum of 150 minutes of exercise per week. The importance of healthy food choices with portion control discussed. Encouraged to start a food diary, count calories and to consider  joining a support group. Sample diet sheets offered. Goals set by the patient for the next several months.    

## 2012-10-11 NOTE — Assessment & Plan Note (Signed)
Improved and controlled currently 

## 2012-12-30 ENCOUNTER — Encounter: Payer: Self-pay | Admitting: Family Medicine

## 2013-01-06 ENCOUNTER — Other Ambulatory Visit: Payer: Self-pay

## 2013-01-06 ENCOUNTER — Ambulatory Visit (INDEPENDENT_AMBULATORY_CARE_PROVIDER_SITE_OTHER): Payer: BC Managed Care – PPO

## 2013-01-06 ENCOUNTER — Other Ambulatory Visit: Payer: Self-pay | Admitting: Family Medicine

## 2013-01-06 DIAGNOSIS — N912 Amenorrhea, unspecified: Secondary | ICD-10-CM

## 2013-01-06 MED ORDER — MEDROXYPROGESTERONE ACETATE 5 MG PO TABS: 5.0000 mg | ORAL_TABLET | Freq: Two times a day (BID) | ORAL | Status: DC

## 2013-01-26 LAB — COMPLETE METABOLIC PANEL WITH GFR
AST: 18 U/L (ref 0–37)
Alkaline Phosphatase: 61 U/L (ref 39–117)
BUN: 13 mg/dL (ref 6–23)
Calcium: 9.3 mg/dL (ref 8.4–10.5)
Creat: 0.86 mg/dL (ref 0.50–1.10)
Total Bilirubin: 0.5 mg/dL (ref 0.3–1.2)

## 2013-01-26 LAB — HEMOGLOBIN A1C: Hgb A1c MFr Bld: 6.8 % — ABNORMAL HIGH (ref <5.7)

## 2013-01-28 ENCOUNTER — Encounter: Payer: Self-pay | Admitting: Family Medicine

## 2013-01-28 ENCOUNTER — Ambulatory Visit (INDEPENDENT_AMBULATORY_CARE_PROVIDER_SITE_OTHER): Payer: BC Managed Care – PPO | Admitting: Family Medicine

## 2013-01-28 VITALS — BP 118/74 | HR 68 | Resp 16 | Ht 62.0 in | Wt 189.0 lb

## 2013-01-28 DIAGNOSIS — E119 Type 2 diabetes mellitus without complications: Secondary | ICD-10-CM

## 2013-01-28 DIAGNOSIS — I10 Essential (primary) hypertension: Secondary | ICD-10-CM

## 2013-01-28 DIAGNOSIS — IMO0001 Reserved for inherently not codable concepts without codable children: Secondary | ICD-10-CM

## 2013-01-28 DIAGNOSIS — E1065 Type 1 diabetes mellitus with hyperglycemia: Secondary | ICD-10-CM

## 2013-01-28 DIAGNOSIS — E669 Obesity, unspecified: Secondary | ICD-10-CM

## 2013-01-28 NOTE — Assessment & Plan Note (Signed)
Controlled, no change in medication DASH diet and commitment to daily physical activity for a minimum of 30 minutes discussed and encouraged, as a part of hypertension management. The importance of attaining a healthy weight is also discussed.  

## 2013-01-28 NOTE — Patient Instructions (Addendum)
F/u mid October ,call if you need me before  CONGRATS on excellent blood sugar with lifestyle change only, yes that DOES work!!!  Continue current meds   congrats on weight loss  Increase [place] distance to 3 miles each day  Weight loss goal of 6 pounds   Test ONCE daily only since on no insulin, and your pharmacy will be notified   Fasting lipid, cmp and eGFr, and HBa1C in [facility] October

## 2013-01-28 NOTE — Assessment & Plan Note (Signed)
Controlled, no change in medication Patient advised to reduce carb and sweets, commit to regular physical activity, take meds as prescribed, test blood as directed, and attempt to lose weight, to improve blood sugar control.  

## 2013-01-28 NOTE — Assessment & Plan Note (Signed)
Improved. Pt applauded on succesful weight loss through lifestyle change, and encouraged to continue same. Weight loss goal set for the next several months.  

## 2013-01-28 NOTE — Progress Notes (Signed)
  Subjective:    Patient ID: [name], female    DOB: 07/06/1976, 37 y.o.   MRN: [id number]  HPI The PT is here for follow up and re-evaluation of chronic medical conditions, medication management and review of any available recent lab and radiology data.  Preventive health is updated, specifically  Cancer screening and Immunization.   Questions or concerns regarding consultations or procedures which the PT has had in the interim are  addressed. The PT denies any adverse reactions to current medications since the last visit.  She has worked consistently on dietary change and exercise with success with improved blood sugar without insulin , also weight lsos There are no specific complaints       Review of Systems See HPI Denies recent fever or chills. Denies sinus pressure, nasal congestion, ear pain or sore throat. Denies chest congestion, productive cough or wheezing. Denies chest pains, palpitations and leg swelling Denies abdominal pain, nausea, vomiting,diarrhea or constipation.   Denies dysuria, frequency, hesitancy or incontinence. Denies joint pain, swelling and limitation in mobility. Denies headaches, seizures, numbness, or tingling. Denies depression, anxiety or insomnia. Denies skin break down or rash.        Objective:   Physical Exam Patient alert and oriented and in no cardiopulmonary distress.  HEENT: No facial asymmetry, EOMI, no sinus tenderness,  oropharynx pink and moist.  Neck supple no adenopathy.  Chest: Clear to auscultation bilaterally.  CVS: S1, S2 no murmurs, no S3.  ABD: Soft non tender. Bowel sounds normal.  Ext: No edema  MS: Adequate ROM spine, shoulders, hips and knees.  Skin: Intact, no ulcerations or rash noted.  Psych: Good eye contact, normal affect. Memory intact not anxious or depressed appearing.  CNS: CN 2-12 intact, power, tone and sensation normal throughout.        Assessment & Plan:

## 2013-03-17 ENCOUNTER — Other Ambulatory Visit: Payer: Self-pay

## 2013-03-17 ENCOUNTER — Telehealth: Payer: Self-pay | Admitting: Family Medicine

## 2013-03-17 MED ORDER — PROPRANOLOL HCL 40 MG PO TABS: 40.0000 mg | ORAL_TABLET | Freq: Two times a day (BID) | ORAL | Status: DC

## 2013-03-17 NOTE — Telephone Encounter (Signed)
Med refilled to [facility] [place]

## 2013-03-20 ENCOUNTER — Other Ambulatory Visit: Payer: Self-pay | Admitting: Family Medicine

## 2013-04-21 ENCOUNTER — Encounter: Payer: Self-pay | Admitting: Family Medicine

## 2013-04-22 ENCOUNTER — Telehealth: Payer: Self-pay | Admitting: Family Medicine

## 2013-04-22 ENCOUNTER — Ambulatory Visit: Payer: BC Managed Care – PPO | Admitting: Family Medicine

## 2013-04-22 MED ORDER — SITAGLIPTIN PHOS-METFORMIN HCL 50-1000 MG PO TABS: 1.0000 | ORAL_TABLET | Freq: Two times a day (BID) | ORAL | Status: DC

## 2013-04-22 NOTE — Telephone Encounter (Signed)
Medication refilled

## 2013-05-27 ENCOUNTER — Other Ambulatory Visit: Payer: Self-pay | Admitting: Family Medicine

## 2013-05-27 LAB — HEMOGLOBIN A1C: Hgb A1c MFr Bld: 7.7 % — ABNORMAL HIGH (ref <5.7)

## 2013-05-27 LAB — COMPLETE METABOLIC PANEL WITH GFR
Alkaline Phosphatase: 69 U/L (ref 39–117)
GFR, Est Non African American: 84 mL/min
Glucose, Bld: 202 mg/dL — ABNORMAL HIGH (ref 70–99)
Sodium: 136 mEq/L (ref 135–145)
Total Bilirubin: 0.3 mg/dL (ref 0.3–1.2)
Total Protein: 7.2 g/dL (ref 6.0–8.3)

## 2013-05-27 LAB — LIPID PANEL
Cholesterol: 158 mg/dL (ref 0–200)
HDL: 41 mg/dL (ref >39)
Total CHOL/HDL Ratio: 3.9 Ratio

## 2013-05-31 ENCOUNTER — Ambulatory Visit (INDEPENDENT_AMBULATORY_CARE_PROVIDER_SITE_OTHER): Payer: BC Managed Care – PPO | Admitting: Family Medicine

## 2013-05-31 ENCOUNTER — Encounter: Payer: Self-pay | Admitting: Family Medicine

## 2013-05-31 VITALS — BP 122/84 | HR 62 | Resp 16 | Ht 62.0 in | Wt 190.1 lb

## 2013-05-31 DIAGNOSIS — N912 Amenorrhea, unspecified: Secondary | ICD-10-CM

## 2013-05-31 DIAGNOSIS — I10 Essential (primary) hypertension: Secondary | ICD-10-CM

## 2013-05-31 DIAGNOSIS — Z1321 Encounter for screening for nutritional disorder: Secondary | ICD-10-CM

## 2013-05-31 DIAGNOSIS — E119 Type 2 diabetes mellitus without complications: Secondary | ICD-10-CM

## 2013-05-31 DIAGNOSIS — Z130 Encounter for screening for diseases of the blood and blood-forming organs and certain disorders involving the immune mechanism: Secondary | ICD-10-CM

## 2013-05-31 DIAGNOSIS — E669 Obesity, unspecified: Secondary | ICD-10-CM

## 2013-05-31 DIAGNOSIS — Z13228 Encounter for screening for other metabolic disorders: Secondary | ICD-10-CM

## 2013-05-31 LAB — POCT URINE PREGNANCY: Preg Test, Ur: NEGATIVE

## 2013-05-31 MED ORDER — PROPRANOLOL HCL 40 MG PO TABS: 40.0000 mg | ORAL_TABLET | Freq: Two times a day (BID) | ORAL | Status: DC

## 2013-05-31 MED ORDER — HYDROCHLOROTHIAZIDE 25 MG PO TABS: 25.0000 mg | ORAL_TABLET | Freq: Every day | ORAL | Status: DC

## 2013-05-31 MED ORDER — PRAVASTATIN SODIUM 10 MG PO TABS: 10.0000 mg | ORAL_TABLET | Freq: Every day | ORAL | Status: DC

## 2013-05-31 MED ORDER — GLYBURIDE 5 MG PO TABS: ORAL_TABLET | ORAL | Status: DC

## 2013-05-31 NOTE — Patient Instructions (Addendum)
F/u in 4 month, call if you need me before  Resume daily walks, watch blood sugar carefully, call if high  Sorry about recent right leg pain  New since you are diabetic to reduce heart disease risk is pravastatin one at bedtime  Urine will be checked today for pregnancy   Watch left hand symptoms , and if worsen , or you decide to, call for referral to neurology. Use ibuprofen one twice daily for 5 days , see if this relieves symptoms  HBA1C, cmp and EGFr in 4 month, cBC , tsh and vitD,

## 2013-05-31 NOTE — Progress Notes (Signed)
  Subjective:    Patient ID: [name], female    DOB: [birth year], 37 y.o.   MRN: [id number]  HPI The PT is here for follow up and re-evaluation of chronic medical conditions, medication management and review of any available recent lab and radiology data.  Preventive health is updated, specifically  Cancer screening and Immunization.   Seen at [facility], for acute sciatica affecting right leg, got steroids and sugar wsa elevated for 3 weeks, this occurred in September.Better now, fasting sugar this am was 102. Also she is now able to walk 2 week h/o acute weakness with increased sensation in left hand over thumb and index web space The PT denies any adverse reactions to current medications since the last visit.         Review of Systems    See HPI Denies recent fever or chills. Denies sinus pressure, nasal congestion, ear pain or sore throat. Denies chest congestion, productive cough or wheezing. Denies chest pains, palpitations and leg swelling Denies abdominal pain, nausea, vomiting,diarrhea or constipation.   Denies dysuria, frequency, hesitancy or incontinence. Denies depression, anxiety or insomnia. Denies skin break down or rash.  Objective:   Physical Exam Patient alert and oriented and in no cardiopulmonary distress.  HEENT: No facial asymmetry, EOMI, no sinus tenderness,  oropharynx pink and moist.  Neck supple no adenopathy.  Chest: Clear to auscultation bilaterally.  CVS: S1, S2 no murmurs, no S3.  ABD: Soft non tender. Bowel sounds normal.  Ext: No edema  MS: Adequate ROM spine, shoulders, hips and knees.  Skin: Intact, no ulcerations or rash noted.  Psych: Good eye contact, normal affect. Memory intact not anxious or depressed appearing.  CNS: CN 2-12 intact, power, tone and sensation normal throughout.        Assessment & Plan:

## 2013-07-11 NOTE — Assessment & Plan Note (Signed)
Controlled, no change in medication DASH diet and commitment to daily physical activity for a minimum of 30 minutes discussed and encouraged, as a part of hypertension management. The importance of attaining a healthy weight is also discussed.  

## 2013-07-11 NOTE — Assessment & Plan Note (Signed)
Deteriorated, additional med added Patient advised to reduce carb and sweets, commit to regular physical activity, take meds as prescribed, test blood as directed, and attempt to lose weight, to improve blood sugar control.

## 2013-07-11 NOTE — Assessment & Plan Note (Signed)
Deteriorated. Patient re-educated about  the importance of commitment to a  minimum of 150 minutes of exercise per week. The importance of healthy food choices with portion control discussed. Encouraged to start a food diary, count calories and to consider  joining a support group. Sample diet sheets offered. Goals set by the patient for the next several months.    

## 2013-07-28 ENCOUNTER — Telehealth: Payer: Self-pay | Admitting: Family Medicine

## 2013-07-29 ENCOUNTER — Encounter: Payer: Self-pay | Admitting: Family Medicine

## 2013-07-29 NOTE — Telephone Encounter (Signed)
Yes her blood sugar is high

## 2013-07-29 NOTE — Telephone Encounter (Signed)
Patient is stating that her blood sugar is 143 fasting and in the 140s before bed.  She states that she is taking Janumet.  Please advise.

## 2013-07-29 NOTE — Telephone Encounter (Signed)
Start invokana 100mg  one daily give her samples x 4 weeks, she needs to call back week of Jan 5 since I will likely inc to the 300mg  dose then. If we do not have samples give coupon and script she is private pay

## 2013-07-29 NOTE — Telephone Encounter (Signed)
Patient aware.    Is she to continue Janumet?

## 2013-07-29 NOTE — Telephone Encounter (Signed)
Noted  

## 2013-08-16 ENCOUNTER — Other Ambulatory Visit: Payer: Self-pay

## 2013-08-16 ENCOUNTER — Telehealth: Payer: Self-pay

## 2013-08-16 MED ORDER — CANAGLIFLOZIN 300 MG PO TABS: ORAL_TABLET | ORAL | Status: DC

## 2013-08-16 NOTE — Telephone Encounter (Signed)
pls increase to 300mg  dose send in script one dailyb#62m refill 4 and provide coupon if she has none, thanks

## 2013-08-16 NOTE — Telephone Encounter (Signed)
Patient was told to call back this week with readings since starting Invokana 100mg  to see if she needed to increase to the 300mg  dose.  Her readings are as follows, 148 143 163 105 89 191 175 130 187 165  245 187. These are mixed readings fasting and 2 hrs after a meal. She did state her average fasting sugar was around 170-180. She is still talking the samples we gave her so needs rx for whichever dose she is to be on. Please advise

## 2013-08-16 NOTE — Telephone Encounter (Signed)
Patient aware and med sent to her pharmacy. Coming to collect coupon

## 2013-09-29 ENCOUNTER — Encounter: Payer: Self-pay | Admitting: Family Medicine

## 2013-10-05 ENCOUNTER — Ambulatory Visit: Payer: BC Managed Care – PPO | Admitting: Family Medicine

## 2013-11-18 ENCOUNTER — Other Ambulatory Visit: Payer: Self-pay

## 2013-11-23 ENCOUNTER — Encounter: Payer: Self-pay | Admitting: Family Medicine

## 2014-06-13 ENCOUNTER — Encounter: Payer: Self-pay | Admitting: Family Medicine

## 2014-06-14 ENCOUNTER — Telehealth: Payer: Self-pay | Admitting: *Deleted

## 2014-06-14 DIAGNOSIS — I10 Essential (primary) hypertension: Secondary | ICD-10-CM

## 2014-06-14 MED ORDER — GLYBURIDE 5 MG PO TABS: ORAL_TABLET | ORAL | Status: DC

## 2014-06-14 MED ORDER — HYDROCHLOROTHIAZIDE 25 MG PO TABS: 25.0000 mg | ORAL_TABLET | Freq: Every day | ORAL | Status: DC

## 2014-06-14 MED ORDER — PROPRANOLOL HCL 40 MG PO TABS: 40.0000 mg | ORAL_TABLET | Freq: Two times a day (BID) | ORAL | Status: DC

## 2014-06-14 MED ORDER — SITAGLIPTIN PHOS-METFORMIN HCL 50-1000 MG PO TABS
1.0000 | ORAL_TABLET | Freq: Two times a day (BID) | ORAL | Status: DC
Start: 2014-06-14 — End: 2014-07-04

## 2014-06-14 NOTE — Telephone Encounter (Signed)
Pt called stating she has an appt with Dr. Moshe Cipro 07/14/14 at 9:15, pt said she is out of her bp medicine and her medication for diabetes, pt wants to know what does she need to do until her appt, pt states she has been out for 3 weeks. Please advise  [phone]

## 2014-06-14 NOTE — Telephone Encounter (Signed)
1 month ONLY given until patient comes in for her appt

## 2014-07-04 ENCOUNTER — Other Ambulatory Visit: Payer: Self-pay | Admitting: Family Medicine

## 2014-07-14 ENCOUNTER — Ambulatory Visit (INDEPENDENT_AMBULATORY_CARE_PROVIDER_SITE_OTHER): Payer: BC Managed Care – PPO | Admitting: Family Medicine

## 2014-07-14 ENCOUNTER — Encounter: Payer: Self-pay | Admitting: Family Medicine

## 2014-07-14 ENCOUNTER — Ambulatory Visit: Payer: BC Managed Care – PPO | Admitting: Family Medicine

## 2014-07-14 VITALS — BP 118/80 | HR 82 | Resp 16 | Ht 62.0 in | Wt 192.0 lb

## 2014-07-14 DIAGNOSIS — IMO0001 Reserved for inherently not codable concepts without codable children: Secondary | ICD-10-CM

## 2014-07-14 DIAGNOSIS — E1169 Type 2 diabetes mellitus with other specified complication: Secondary | ICD-10-CM

## 2014-07-14 DIAGNOSIS — D649 Anemia, unspecified: Secondary | ICD-10-CM

## 2014-07-14 DIAGNOSIS — Z13220 Encounter for screening for lipoid disorders: Secondary | ICD-10-CM

## 2014-07-14 DIAGNOSIS — E119 Type 2 diabetes mellitus without complications: Secondary | ICD-10-CM

## 2014-07-14 DIAGNOSIS — B351 Tinea unguium: Secondary | ICD-10-CM

## 2014-07-14 DIAGNOSIS — Z139 Encounter for screening, unspecified: Secondary | ICD-10-CM

## 2014-07-14 DIAGNOSIS — E669 Obesity, unspecified: Secondary | ICD-10-CM

## 2014-07-14 DIAGNOSIS — K219 Gastro-esophageal reflux disease without esophagitis: Secondary | ICD-10-CM

## 2014-07-14 DIAGNOSIS — B353 Tinea pedis: Secondary | ICD-10-CM

## 2014-07-14 DIAGNOSIS — I10 Essential (primary) hypertension: Secondary | ICD-10-CM

## 2014-07-14 DIAGNOSIS — R1013 Epigastric pain: Secondary | ICD-10-CM

## 2014-07-14 MED ORDER — SITAGLIPTIN PHOS-METFORMIN HCL 50-1000 MG PO TABS: 1.0000 | ORAL_TABLET | Freq: Two times a day (BID) | ORAL | Status: DC

## 2014-07-14 MED ORDER — GLYBURIDE 5 MG PO TABS: ORAL_TABLET | ORAL | Status: DC

## 2014-07-14 MED ORDER — KETOCONAZOLE 2 % EX CREA: TOPICAL_CREAM | CUTANEOUS | Status: DC

## 2014-07-14 MED ORDER — PROPRANOLOL HCL 40 MG PO TABS: 40.0000 mg | ORAL_TABLET | Freq: Two times a day (BID) | ORAL | Status: DC

## 2014-07-14 MED ORDER — HYDROCHLOROTHIAZIDE 25 MG PO TABS: 25.0000 mg | ORAL_TABLET | Freq: Every day | ORAL | Status: DC

## 2014-07-14 MED ORDER — OMEPRAZOLE 20 MG PO CPDR: 20.0000 mg | DELAYED_RELEASE_CAPSULE | Freq: Every day | ORAL | Status: DC

## 2014-07-14 NOTE — Patient Instructions (Addendum)
F/u in 3.5 month, call if you need me before  Congratsd on baby!  New for fungal foot infection is a cream which has been prescribed, after labs are reviewed , tablets will follow  Labs today, cBc , iron and ferritin, lipid, cmp and EGFR, HBa1C, H pylori  Microalb from office  For reflux use med as needed only, and reduce /stop caffeine also weight loss will help, omeprazole

## 2014-07-14 NOTE — Progress Notes (Signed)
   Subjective:    Patient ID: [name], female    DOB: 08-12-1976, 38 y.o.   MRN: [id number]  HPI  The PT is here for follow up and re-evaluation of chronic medical conditions, medication management and review of any available recent lab and radiology data.  Preventive health is updated, specifically  Cancer screening and Immunization.   Delivered baby 3 months ago since then she has had reflux symptoms intermittently. The PT denies any adverse reactions to current medications since the last visit.        Review of Systems See HPI Denies recent fever or chills. Denies sinus pressure, nasal congestion, ear pain or sore throat. Denies chest congestion, productive cough or wheezing. Denies chest pains, palpitations and leg swelling Denies  nausea, vomiting,diarrhea or constipation.   Denies dysuria, frequency, hesitancy or incontinence. Denies joint pain, swelling and limitation in mobility. Denies headaches, seizures, numbness, or tingling. Denies depression, anxiety or insomnia.        Objective:   Physical Exam  BP 118/80 mmHg  Pulse 82  Resp 16  Ht 5\' 2"  (1.575 m)  Wt 192 lb (87.091 kg)  BMI 35.11 kg/m2  SpO2 99% Patient alert and oriented and in no cardiopulmonary distress.  HEENT: No facial asymmetry, EOMI,   oropharynx pink and moist.  Neck supple no JVD, no mass.  Chest: Clear to auscultation bilaterally.  CVS: S1, S2 no murmurs, no S3.Regular rate.  ABD: Soft non tender.   Ext: No edema  MS: Adequate ROM spine, shoulders, hips and knees.  Skin: Intact, no ulcerations or rash noted.  Psych: Good eye contact, normal affect. Memory intact not anxious or depressed appearing.  CNS: CN 2-12 intact, power,  normal throughout.no focal deficits noted.      Assessment & Plan:  Essential hypertension Controlled, no change in medication DASH diet and commitment to daily physical activity for a minimum of 30 minutes discussed and encouraged, as a  part of hypertension management. The importance of attaining a healthy weight is also discussed.   GERD (gastroesophageal reflux disease) Increased and uncontrolled Check H pylori Start omeprazole , educate re management of GERD  Diabetes mellitus type 2 in obese Improved Patient advised to reduce carb and sweets, commit to regular physical activity, take meds as prescribed, test blood as directed, and attempt to lose weight, to improve blood sugar control. Updated lab needed at/ before next visit.   Tinea pedis Significant onychomycosis, hepatic panel is normal Will send 3 months oral med  Obesity, Class II, BMI 35-39.9, with comorbidity unchanged Patient re-educated about  the importance of commitment to a  minimum of 150 minutes of exercise per week. The importance of healthy food choices with portion control discussed. Encouraged to start a food diary, count calories and to consider  joining a support group. Sample diet sheets offered. Goals set by the patient for the next several months.

## 2014-07-15 LAB — COMPLETE METABOLIC PANEL WITH GFR
ALT: 11 U/L (ref 0–35)
AST: 13 U/L (ref 0–37)
Albumin: 3.9 g/dL (ref 3.5–5.2)
Alkaline Phosphatase: 76 U/L (ref 39–117)
BILIRUBIN TOTAL: 0.5 mg/dL (ref 0.2–1.2)
BUN: 13 mg/dL (ref 6–23)
CALCIUM: 9.2 mg/dL (ref 8.4–10.5)
CHLORIDE: 99 meq/L (ref 96–112)
CO2: 30 meq/L (ref 19–32)
Creat: 0.88 mg/dL (ref 0.50–1.10)
GFR, EST NON AFRICAN AMERICAN: 84 mL/min
Glucose, Bld: 155 mg/dL — ABNORMAL HIGH (ref 70–99)
Potassium: 5.1 mEq/L (ref 3.5–5.3)
SODIUM: 139 meq/L (ref 135–145)
TOTAL PROTEIN: 6.9 g/dL (ref 6.0–8.3)

## 2014-07-15 LAB — CBC WITH DIFFERENTIAL/PLATELET
Basophils Absolute: 0 10*3/uL (ref 0.0–0.1)
Basophils Relative: 0 % (ref 0–1)
Eosinophils Absolute: 0.1 10*3/uL (ref 0.0–0.7)
Eosinophils Relative: 1 % (ref 0–5)
HEMATOCRIT: 36 % (ref 36.0–46.0)
Hemoglobin: 11.8 g/dL — ABNORMAL LOW (ref 12.0–15.0)
Lymphocytes Relative: 22 % (ref 12–46)
Lymphs Abs: 1.9 10*3/uL (ref 0.7–4.0)
MCH: 26.4 pg (ref 26.0–34.0)
MCHC: 32.8 g/dL (ref 30.0–36.0)
MCV: 80.5 fL (ref 78.0–100.0)
MONOS PCT: 6 % (ref 3–12)
MPV: 11.6 fL (ref 9.4–12.4)
Monocytes Absolute: 0.5 10*3/uL (ref 0.1–1.0)
NEUTROS ABS: 6 10*3/uL (ref 1.7–7.7)
NEUTROS PCT: 71 % (ref 43–77)
Platelets: 231 10*3/uL (ref 150–400)
RBC: 4.47 MIL/uL (ref 3.87–5.11)
RDW: 14 % (ref 11.5–15.5)
WBC: 8.5 10*3/uL (ref 4.0–10.5)

## 2014-07-15 LAB — MICROALBUMIN / CREATININE URINE RATIO
Creatinine, Urine: 181.3 mg/dL
Microalb Creat Ratio: 17.1 mg/g (ref 0.0–30.0)
Microalb, Ur: 3.1 mg/dL — ABNORMAL HIGH (ref <2.0)

## 2014-07-15 LAB — LIPID PANEL
Cholesterol: 159 mg/dL (ref 0–200)
HDL: 38 mg/dL — AB (ref >39)
LDL Cholesterol: 109 mg/dL — ABNORMAL HIGH (ref 0–99)
Total CHOL/HDL Ratio: 4.2 Ratio
Triglycerides: 59 mg/dL (ref <150)
VLDL: 12 mg/dL (ref 0–40)

## 2014-07-15 LAB — HEMOGLOBIN A1C
Hgb A1c MFr Bld: 6.8 % — ABNORMAL HIGH (ref <5.7)
Mean Plasma Glucose: 148 mg/dL — ABNORMAL HIGH (ref <117)

## 2014-07-15 LAB — FERRITIN: Ferritin: 29 ng/mL (ref 10–291)

## 2014-07-15 LAB — IRON: IRON: 77 ug/dL (ref 42–145)

## 2014-07-17 ENCOUNTER — Encounter: Payer: Self-pay | Admitting: Family Medicine

## 2014-07-17 MED ORDER — TERBINAFINE HCL 250 MG PO TABS: 250.0000 mg | ORAL_TABLET | Freq: Every day | ORAL | Status: DC

## 2014-07-17 NOTE — Assessment & Plan Note (Signed)
Controlled, no change in medication DASH diet and commitment to daily physical activity for a minimum of 30 minutes discussed and encouraged, as a part of hypertension management. The importance of attaining a healthy weight is also discussed.  

## 2014-07-17 NOTE — Assessment & Plan Note (Signed)
Significant onychomycosis, hepatic panel is normal Will send 3 months oral med

## 2014-07-17 NOTE — Assessment & Plan Note (Signed)
Improved Patient advised to reduce carb and sweets, commit to regular physical activity, take meds as prescribed, test blood as directed, and attempt to lose weight, to improve blood sugar control. Updated lab needed at/ before next visit.

## 2014-07-17 NOTE — Assessment & Plan Note (Signed)
unchanged Patient re-educated about  the importance of commitment to a  minimum of 150 minutes of exercise per week. The importance of healthy food choices with portion control discussed. Encouraged to start a food diary, count calories and to consider  joining a support group. Sample diet sheets offered. Goals set by the patient for the next several months.    

## 2014-07-17 NOTE — Assessment & Plan Note (Signed)
Increased and uncontrolled Check H pylori Start omeprazole , educate re management of GERD

## 2014-07-18 LAB — H. PYLORI ANTIBODY, IGG: H PYLORI IGG: 0.58 {ISR}

## 2014-07-18 MED ORDER — PRAVASTATIN SODIUM 10 MG PO TABS: 10.0000 mg | ORAL_TABLET | Freq: Every day | ORAL | Status: DC

## 2014-07-18 NOTE — Addendum Note (Signed)
Addended by: Eual Fines on: 07/18/2014 02:33 PM   Modules accepted: Orders

## 2014-10-03 ENCOUNTER — Other Ambulatory Visit: Payer: Self-pay | Admitting: Family Medicine

## 2014-10-05 ENCOUNTER — Other Ambulatory Visit: Payer: Self-pay | Admitting: Family Medicine

## 2014-10-06 ENCOUNTER — Other Ambulatory Visit: Payer: Self-pay | Admitting: Family Medicine

## 2014-10-21 ENCOUNTER — Encounter: Payer: Self-pay | Admitting: Family Medicine

## 2014-10-24 ENCOUNTER — Encounter: Payer: Self-pay | Admitting: Family Medicine

## 2014-10-24 ENCOUNTER — Ambulatory Visit (INDEPENDENT_AMBULATORY_CARE_PROVIDER_SITE_OTHER): Payer: BLUE CROSS/BLUE SHIELD | Admitting: Family Medicine

## 2014-10-24 VITALS — BP 114/70 | HR 82 | Resp 16 | Ht 62.0 in | Wt 193.0 lb

## 2014-10-24 DIAGNOSIS — E119 Type 2 diabetes mellitus without complications: Secondary | ICD-10-CM

## 2014-10-24 DIAGNOSIS — K219 Gastro-esophageal reflux disease without esophagitis: Secondary | ICD-10-CM | POA: Diagnosis not present

## 2014-10-24 DIAGNOSIS — E785 Hyperlipidemia, unspecified: Secondary | ICD-10-CM

## 2014-10-24 DIAGNOSIS — I10 Essential (primary) hypertension: Secondary | ICD-10-CM

## 2014-10-24 DIAGNOSIS — IMO0001 Reserved for inherently not codable concepts without codable children: Secondary | ICD-10-CM

## 2014-10-24 DIAGNOSIS — E669 Obesity, unspecified: Secondary | ICD-10-CM

## 2014-10-24 DIAGNOSIS — E1169 Type 2 diabetes mellitus with other specified complication: Secondary | ICD-10-CM

## 2014-10-24 MED ORDER — HYDROCHLOROTHIAZIDE 25 MG PO TABS: 25.0000 mg | ORAL_TABLET | Freq: Every day | ORAL | Status: DC

## 2014-10-24 MED ORDER — PROPRANOLOL HCL 40 MG PO TABS: 40.0000 mg | ORAL_TABLET | Freq: Two times a day (BID) | ORAL | Status: DC

## 2014-10-24 MED ORDER — SITAGLIPTIN PHOS-METFORMIN HCL 50-1000 MG PO TABS: 1.0000 | ORAL_TABLET | Freq: Two times a day (BID) | ORAL | Status: DC

## 2014-10-24 NOTE — Progress Notes (Signed)
Subjective:    Patient ID: [name], female    DOB: 23-Nov-1975, 39 y.o.   MRN: [id number]  HPI    [name]     MRN: [id number]      DOB: January 16, 1976   HPI [name] is here for follow up and re-evaluation of chronic medical conditions, medication management and review of any available recent lab and radiology data.  Preventive health is updated, specifically  Cancer screening and Immunization.   Questions or concerns regarding consultations or procedures which the PT has had in the interim are  addressed. The PT denies any adverse reactions to current medications since the last visit.  There are no new concerns.  There are no specific complaints   ROS Denies recent fever or chills. Denies sinus pressure, nasal congestion, ear pain or sore throat. Denies chest congestion, productive cough or wheezing. Denies chest pains, palpitations and leg swelling Denies abdominal pain, nausea, vomiting,diarrhea or constipation.   Denies dysuria, frequency, hesitancy or incontinence. Denies joint pain, swelling and limitation in mobility. Denies headaches, seizures, numbness, or tingling. Denies depression, anxiety or insomnia. Denies skin break down or rash.   PE  BP 114/70 mmHg  Pulse 82  Resp 16  Ht 5\' 2"  (1.575 m)  Wt 193 lb (87.544 kg)  BMI 35.29 kg/m2  SpO2 100%  Patient alert and oriented and in no cardiopulmonary distress.  HEENT: No facial asymmetry, EOMI,   oropharynx pink and moist.  Neck supple no JVD, no mass.  Chest: Clear to auscultation bilaterally.  CVS: S1, S2 no murmurs, no S3.Regular rate.  ABD: Soft non tender.   Ext: No edema  MS: Adequate ROM spine, shoulders, hips and knees.  Skin: Intact, no ulcerations or rash noted.  Psych: Good eye contact, normal affect. Memory intact not anxious or depressed appearing.  CNS: CN 2-12 intact, power,  normal throughout.no focal deficits noted.   Assessment & Plan   Essential  hypertension Controlled, no change in medication DASH diet and commitment to daily physical activity for a minimum of 30 minutes discussed and encouraged, as a part of hypertension management. The importance of attaining a healthy weight is also discussed.  BP/Weight 10/24/2014 07/14/2014 05/31/2013 01/28/2013 10/08/2012 09/18/2012 12/18/3265  Systolic BP 124 580 998 338 250 539 767  Diastolic BP 70 80 84 74 82 69 80  Wt. (Lbs) 193 192 190.12 189 194 190 192  BMI 35.29 35.11 34.76 34.56 35.47 34.74 35.11    CMP Latest Ref Rng 10/26/2014 07/15/2014 05/27/2013  Glucose 70 - 99 mg/dL 177(H) 155(H) 202(H)  BUN 6 - 23 mg/dL 11 13 11   Creatinine 0.50 - 1.10 mg/dL 0.87 0.88 0.89  Sodium 135 - 145 mEq/L 139 139 136  Potassium 3.5 - 5.3 mEq/L 4.6 5.1 3.9  Chloride 96 - 112 mEq/L 97 99 99  CO2 19 - 32 mEq/L 31 30 30   Calcium 8.4 - 10.5 mg/dL 9.3 9.2 9.5  Total Protein 6.0 - 8.3 g/dL 6.7 6.9 7.2  Total Bilirubin 0.2 - 1.2 mg/dL 0.4 0.5 0.3  Alkaline Phos 39 - 117 U/L 71 76 69  AST 0 - 37 U/L 15 13 18   ALT 0 - 35 U/L 17 11 23         GERD (gastroesophageal reflux disease) Improved, pt to attempt to wean off of PPI   Diabetes mellitus type 2 in obese  Deteriortaed , want s to be more diligent with diet , exercise and weight loss before furhter  med changes made, cites recent holidays as a cause fpor worsened control Patient educated about the importance of limiting  Carbohydrate intake , the need to commit to daily physical activity for a minimum of 30 minutes , and to commit weight loss. The fact that changes in all these areas will reduce or eliminate all together the development of diabetes is stressed.   Diabetic Labs Latest Ref Rng 10/26/2014 07/15/2014 07/14/2014 05/27/2013 01/26/2013  HbA1c <5.7 % 7.2(H) 6.8(H) - 7.7(H) 6.8(H)  Microalbumin <2.0 mg/dL - - 3.1(H) - -  Micro/Creat Ratio 0.0 - 30.0 mg/g - - 17.1 - -  Chol 0 - 200 mg/dL 148 159 - 158 -  HDL >=46 mg/dL 42(L) 38(L) - 41 -  Calc  LDL 0 - 99 mg/dL 88 109(H) - 101(H) -  Triglycerides <150 mg/dL 88 59 - 82 -  Creatinine 0.50 - 1.10 mg/dL 0.87 0.88 - 0.89 0.86   BP/Weight 10/24/2014 07/14/2014 05/31/2013 01/28/2013 10/08/2012 09/18/2012 1/94/1740  Systolic BP 814 481 856 314 970 263 785  Diastolic BP 70 80 84 74 82 69 80  Wt. (Lbs) 193 192 190.12 189 194 190 192  BMI 35.29 35.11 34.76 34.56 35.47 34.74 35.11   Foot/eye exam completion dates 07/14/2014 10/08/2012  Eye Exam - -  Foot exam Order - -  Foot Form Completion Done Done        Obesity, Class II, BMI 35-39.9, with comorbidity Unchanged Patient re-educated about  the importance of commitment to a  minimum of 150 minutes of exercise per week.  The importance of healthy food choices with portion control discussed. Encouraged to start a food diary, count calories and to consider  joining a support group. Sample diet sheets offered. Goals set by the patient for the next several months.   Wt Readings from Last 3 Encounters:  10/24/14 193 lb (87.544 kg)  07/14/14 192 lb (87.091 kg)  05/31/13 190 lb 1.9 oz (86.238 kg)    Body mass index is 35.29 kg/(m^2).  Current exercise per week 90 minutes.    Hyperlipidemia LDL goal <100 Improved. Hyperlipidemia:Low fat diet discussed and encouraged.  CMP Latest Ref Rng 10/26/2014 07/15/2014 05/27/2013  Glucose 70 - 99 mg/dL 177(H) 155(H) 202(H)  BUN 6 - 23 mg/dL 11 13 11   Creatinine 0.50 - 1.10 mg/dL 0.87 0.88 0.89  Sodium 135 - 145 mEq/L 139 139 136  Potassium 3.5 - 5.3 mEq/L 4.6 5.1 3.9  Chloride 96 - 112 mEq/L 97 99 99  CO2 19 - 32 mEq/L 31 30 30   Calcium 8.4 - 10.5 mg/dL 9.3 9.2 9.5  Total Protein 6.0 - 8.3 g/dL 6.7 6.9 7.2  Total Bilirubin 0.2 - 1.2 mg/dL 0.4 0.5 0.3  Alkaline Phos 39 - 117 U/L 71 76 69  AST 0 - 37 U/L 15 13 18   ALT 0 - 35 U/L 17 11 23     Lipid Panel     Component Value Date/Time   CHOL 148 10/26/2014 0800   TRIG 88 10/26/2014 0800   HDL 42* 10/26/2014 0800   CHOLHDL 3.5 10/26/2014  0800   VLDL 18 10/26/2014 0800   LDLCALC 88 10/26/2014 0800             Review of Systems     Objective:   Physical Exam        Assessment & Plan:

## 2014-10-24 NOTE — Patient Instructions (Addendum)
F/u in 4 month, call if you need me before  Fasting labs today  It is important that you exercise regularly at least 30 minutes 5 times a week. If you develop chest pain, have severe difficulty breathing, or feel very tired, stop exercising immediately and seek medical attention   .  Goal for fasting blood sugar ranges from 80 to 120 and 2 hours after any meal or at bedtime should be between 130 to 170.

## 2014-10-27 LAB — COMPLETE METABOLIC PANEL WITH GFR
ALK PHOS: 71 U/L (ref 39–117)
ALT: 17 U/L (ref 0–35)
AST: 15 U/L (ref 0–37)
Albumin: 4 g/dL (ref 3.5–5.2)
BILIRUBIN TOTAL: 0.4 mg/dL (ref 0.2–1.2)
BUN: 11 mg/dL (ref 6–23)
CHLORIDE: 97 meq/L (ref 96–112)
CO2: 31 meq/L (ref 19–32)
Calcium: 9.3 mg/dL (ref 8.4–10.5)
Creat: 0.87 mg/dL (ref 0.50–1.10)
GFR, EST NON AFRICAN AMERICAN: 85 mL/min
GFR, Est African American: >89 mL/min
Glucose, Bld: 177 mg/dL — ABNORMAL HIGH (ref 70–99)
Potassium: 4.6 mEq/L (ref 3.5–5.3)
Sodium: 139 mEq/L (ref 135–145)
Total Protein: 6.7 g/dL (ref 6.0–8.3)

## 2014-10-27 LAB — LIPID PANEL
Cholesterol: 148 mg/dL (ref 0–200)
HDL: 42 mg/dL — AB (ref >=46)
LDL CALC: 88 mg/dL (ref 0–99)
Total CHOL/HDL Ratio: 3.5 Ratio
Triglycerides: 88 mg/dL (ref <150)
VLDL: 18 mg/dL (ref 0–40)

## 2014-10-27 LAB — HEMOGLOBIN A1C
HEMOGLOBIN A1C: 7.2 % — AB (ref <5.7)
Mean Plasma Glucose: 160 mg/dL — ABNORMAL HIGH (ref <117)

## 2014-11-01 ENCOUNTER — Other Ambulatory Visit: Payer: Self-pay | Admitting: Family Medicine

## 2014-11-04 ENCOUNTER — Other Ambulatory Visit: Payer: Self-pay | Admitting: Family Medicine

## 2014-11-07 ENCOUNTER — Other Ambulatory Visit: Payer: Self-pay | Admitting: Family Medicine

## 2014-11-07 DIAGNOSIS — E782 Mixed hyperlipidemia: Secondary | ICD-10-CM | POA: Insufficient documentation

## 2014-11-07 NOTE — Assessment & Plan Note (Signed)
Controlled, no change in medication DASH diet and commitment to daily physical activity for a minimum of 30 minutes discussed and encouraged, as a part of hypertension management. The importance of attaining a healthy weight is also discussed.  BP/Weight 10/24/2014 07/14/2014 05/31/2013 01/28/2013 10/08/2012 09/18/2012 7/67/3419  Systolic BP 379 024 097 353 299 242 683  Diastolic BP 70 80 84 74 82 69 80  Wt. (Lbs) 193 192 190.12 189 194 190 192  BMI 35.29 35.11 34.76 34.56 35.47 34.74 35.11    CMP Latest Ref Rng 10/26/2014 07/15/2014 05/27/2013  Glucose 70 - 99 mg/dL 177(H) 155(H) 202(H)  BUN 6 - 23 mg/dL 11 13 11   Creatinine 0.50 - 1.10 mg/dL 0.87 0.88 0.89  Sodium 135 - 145 mEq/L 139 139 136  Potassium 3.5 - 5.3 mEq/L 4.6 5.1 3.9  Chloride 96 - 112 mEq/L 97 99 99  CO2 19 - 32 mEq/L 31 30 30   Calcium 8.4 - 10.5 mg/dL 9.3 9.2 9.5  Total Protein 6.0 - 8.3 g/dL 6.7 6.9 7.2  Total Bilirubin 0.2 - 1.2 mg/dL 0.4 0.5 0.3  Alkaline Phos 39 - 117 U/L 71 76 69  AST 0 - 37 U/L 15 13 18   ALT 0 - 35 U/L 17 11 23

## 2014-11-07 NOTE — Assessment & Plan Note (Signed)
Improved, pt to attempt to wean off of PPI

## 2014-11-07 NOTE — Assessment & Plan Note (Addendum)
  Deteriortaed , want s to be more diligent with diet , exercise and weight loss before furhter med changes made, cites recent holidays as a cause fpor worsened control Patient educated about the importance of limiting  Carbohydrate intake , the need to commit to daily physical activity for a minimum of 30 minutes , and to commit weight loss. The fact that changes in all these areas will reduce or eliminate all together the development of diabetes is stressed.   Diabetic Labs Latest Ref Rng 10/26/2014 07/15/2014 07/14/2014 05/27/2013 01/26/2013  HbA1c <5.7 % 7.2(H) 6.8(H) - 7.7(H) 6.8(H)  Microalbumin <2.0 mg/dL - - 3.1(H) - -  Micro/Creat Ratio 0.0 - 30.0 mg/g - - 17.1 - -  Chol 0 - 200 mg/dL 148 159 - 158 -  HDL >=46 mg/dL 42(L) 38(L) - 41 -  Calc LDL 0 - 99 mg/dL 88 109(H) - 101(H) -  Triglycerides <150 mg/dL 88 59 - 82 -  Creatinine 0.50 - 1.10 mg/dL 0.87 0.88 - 0.89 0.86   BP/Weight 10/24/2014 07/14/2014 05/31/2013 01/28/2013 10/08/2012 09/18/2012 12/08/7679  Systolic BP 157 262 035 597 416 384 536  Diastolic BP 70 80 84 74 82 69 80  Wt. (Lbs) 193 192 190.12 189 194 190 192  BMI 35.29 35.11 34.76 34.56 35.47 34.74 35.11   Foot/eye exam completion dates 07/14/2014 10/08/2012  Eye Exam - -  Foot exam Order - -  Foot Form Completion Done Done

## 2014-11-07 NOTE — Assessment & Plan Note (Signed)
Unchanged Patient re-educated about  the importance of commitment to a  minimum of 150 minutes of exercise per week.  The importance of healthy food choices with portion control discussed. Encouraged to start a food diary, count calories and to consider  joining a support group. Sample diet sheets offered. Goals set by the patient for the next several months.   Wt Readings from Last 3 Encounters:  10/24/14 193 lb (87.544 kg)  07/14/14 192 lb (87.091 kg)  05/31/13 190 lb 1.9 oz (86.238 kg)    Body mass index is 35.29 kg/(m^2).  Current exercise per week 90 minutes.

## 2014-11-07 NOTE — Assessment & Plan Note (Signed)
Improved. Hyperlipidemia:Low fat diet discussed and encouraged.  CMP Latest Ref Rng 10/26/2014 07/15/2014 05/27/2013  Glucose 70 - 99 mg/dL 177(H) 155(H) 202(H)  BUN 6 - 23 mg/dL 11 13 11   Creatinine 0.50 - 1.10 mg/dL 0.87 0.88 0.89  Sodium 135 - 145 mEq/L 139 139 136  Potassium 3.5 - 5.3 mEq/L 4.6 5.1 3.9  Chloride 96 - 112 mEq/L 97 99 99  CO2 19 - 32 mEq/L 31 30 30   Calcium 8.4 - 10.5 mg/dL 9.3 9.2 9.5  Total Protein 6.0 - 8.3 g/dL 6.7 6.9 7.2  Total Bilirubin 0.2 - 1.2 mg/dL 0.4 0.5 0.3  Alkaline Phos 39 - 117 U/L 71 76 69  AST 0 - 37 U/L 15 13 18   ALT 0 - 35 U/L 17 11 23     Lipid Panel     Component Value Date/Time   CHOL 148 10/26/2014 0800   TRIG 88 10/26/2014 0800   HDL 42* 10/26/2014 0800   CHOLHDL 3.5 10/26/2014 0800   VLDL 18 10/26/2014 0800   LDLCALC 88 10/26/2014 0800

## 2014-12-01 ENCOUNTER — Other Ambulatory Visit: Payer: Self-pay | Admitting: Family Medicine

## 2015-02-22 ENCOUNTER — Ambulatory Visit (INDEPENDENT_AMBULATORY_CARE_PROVIDER_SITE_OTHER): Payer: BLUE CROSS/BLUE SHIELD | Admitting: Family Medicine

## 2015-02-22 ENCOUNTER — Encounter: Payer: Self-pay | Admitting: Family Medicine

## 2015-02-22 VITALS — BP 112/80 | HR 74 | Resp 18 | Ht 62.0 in | Wt 190.0 lb

## 2015-02-22 DIAGNOSIS — I10 Essential (primary) hypertension: Secondary | ICD-10-CM | POA: Diagnosis not present

## 2015-02-22 DIAGNOSIS — K219 Gastro-esophageal reflux disease without esophagitis: Secondary | ICD-10-CM

## 2015-02-22 DIAGNOSIS — E119 Type 2 diabetes mellitus without complications: Secondary | ICD-10-CM | POA: Diagnosis not present

## 2015-02-22 DIAGNOSIS — E1169 Type 2 diabetes mellitus with other specified complication: Secondary | ICD-10-CM

## 2015-02-22 DIAGNOSIS — IMO0001 Reserved for inherently not codable concepts without codable children: Secondary | ICD-10-CM

## 2015-02-22 DIAGNOSIS — E785 Hyperlipidemia, unspecified: Secondary | ICD-10-CM

## 2015-02-22 DIAGNOSIS — E669 Obesity, unspecified: Secondary | ICD-10-CM

## 2015-02-22 LAB — COMPLETE METABOLIC PANEL WITH GFR
ALT: 19 U/L (ref 0–35)
AST: 14 U/L (ref 0–37)
Albumin: 3.9 g/dL (ref 3.5–5.2)
Alkaline Phosphatase: 62 U/L (ref 39–117)
BUN: 10 mg/dL (ref 6–23)
CALCIUM: 9.3 mg/dL (ref 8.4–10.5)
CHLORIDE: 97 meq/L (ref 96–112)
CO2: 31 mEq/L (ref 19–32)
CREATININE: 0.72 mg/dL (ref 0.50–1.10)
GLUCOSE: 189 mg/dL — AB (ref 70–99)
POTASSIUM: 3.4 meq/L — AB (ref 3.5–5.3)
SODIUM: 138 meq/L (ref 135–145)
Total Bilirubin: 0.5 mg/dL (ref 0.2–1.2)
Total Protein: 6.7 g/dL (ref 6.0–8.3)

## 2015-02-22 LAB — TSH: TSH: 0.497 u[IU]/mL (ref 0.350–4.500)

## 2015-02-22 LAB — HEMOGLOBIN A1C
Hgb A1c MFr Bld: 8.4 % — ABNORMAL HIGH (ref <5.7)
Mean Plasma Glucose: 194 mg/dL — ABNORMAL HIGH (ref <117)

## 2015-02-22 MED ORDER — SITAGLIPTIN PHOSPHATE 100 MG PO TABS: 100.0000 mg | ORAL_TABLET | Freq: Every day | ORAL | Status: DC

## 2015-02-22 NOTE — Patient Instructions (Addendum)
F/u in 3 month, call if you need me before  STOP janumet, new is [place] 1 daily , continue diabeta as before  You are referred for diabetic ed, pLS go  Please exercise for 30 minutes 7 times a week. If you develop chest pain, have severe difficulty breathing, or feel very tired, stop exercising immediately and seek medical attention   HBa1C, chem 7 and eGFr, TSH and vit D today  Please work on good  health habits so that your health will improve. 1. Commitment to daily physical activity for 30 to 60  minutes, if you are able to do this.  2. Commitment to wise food choices. Aim for half of your  food intake to be vegetable and fruit, one quarter starchy foods, and one quarter protein. Try to eat on a regular schedule  3 meals per day, snacking between meals should be limited to vegetables or fruits or small portions of nuts. 64 ounces of water per day is generally recommended, unless you have specific health conditions, like heart failure or kidney failure where you will need to limit fluid intake.  3. Commitment to sufficient and a  good quality of physical and mental rest daily, generally between 6 to 8 hours per day.  WITH PERSISTANCE AND PERSEVERANCE, THE IMPOSSIBLE , BECOMES THE NORM!   Thanks for choosing [facility], we consider it a privelige to serve you.

## 2015-02-23 LAB — VITAMIN D 25 HYDROXY (VIT D DEFICIENCY, FRACTURES): VIT D 25 HYDROXY: 26 ng/mL — AB (ref 30–100)

## 2015-03-01 ENCOUNTER — Other Ambulatory Visit: Payer: Self-pay | Admitting: Family Medicine

## 2015-03-02 MED ORDER — CANAGLIFLOZIN 300 MG PO TABS: 300.0000 mg | ORAL_TABLET | Freq: Every day | ORAL | Status: DC

## 2015-03-03 ENCOUNTER — Other Ambulatory Visit: Payer: Self-pay | Admitting: Family Medicine

## 2015-03-06 ENCOUNTER — Other Ambulatory Visit: Payer: Self-pay | Admitting: Family Medicine

## 2015-03-09 ENCOUNTER — Encounter: Payer: Self-pay | Admitting: Family Medicine

## 2015-03-09 ENCOUNTER — Telehealth: Payer: Self-pay | Admitting: Family Medicine

## 2015-03-09 DIAGNOSIS — E1165 Type 2 diabetes mellitus with hyperglycemia: Secondary | ICD-10-CM

## 2015-03-09 DIAGNOSIS — IMO0002 Reserved for concepts with insufficient information to code with codable children: Secondary | ICD-10-CM

## 2015-03-09 NOTE — Telephone Encounter (Signed)
Pls call pt , let her know I saw her tele msg. She needs to see endo since her blood sugars are still high, we had discussed this at visit and she is in agreement, she will be best off with insulin I have entered referral Pls ensure she has already been referred to diabetic educator, thanks

## 2015-03-10 ENCOUNTER — Other Ambulatory Visit: Payer: Self-pay

## 2015-03-10 ENCOUNTER — Encounter: Payer: Self-pay | Admitting: Family Medicine

## 2015-03-10 DIAGNOSIS — L600 Ingrowing nail: Secondary | ICD-10-CM

## 2015-03-10 MED ORDER — FLUCONAZOLE 150 MG PO TABS: 150.0000 mg | ORAL_TABLET | Freq: Once | ORAL | Status: AC

## 2015-03-10 NOTE — Telephone Encounter (Signed)
Patient aware.

## 2015-03-10 NOTE — Assessment & Plan Note (Signed)
Controlled, no change in medication Hyperlipidemia:Low fat diet discussed and encouraged.   Lipid Panel  Lab Results  Component Value Date   CHOL 148 10/26/2014   HDL 42* 10/26/2014   LDLCALC 88 10/26/2014   TRIG 88 10/26/2014   CHOLHDL 3.5 10/26/2014   Updated lab needed at/ before next visit. Need sto commit to more regular exercise

## 2015-03-10 NOTE — Assessment & Plan Note (Addendum)
Deteriorated, and intolerant of metformin. Pt to get diabetic ed, commit to daily exercise and weight loss Start januvia , stop janumet. Call if blood sugars remain high, she will be referred to endo, as I believe she will benefit from being controlled on insulin only [name] is reminded of the importance of commitment to daily physical activity for 30 minutes or more, as able and the need to limit carbohydrate intake to 30 to 60 grams per meal to help with blood sugar control.   The need to take medication as prescribed, test blood sugar as directed, and to call between visits if there is a concern that blood sugar is uncontrolled is also discussed.   [name] is reminded of the importance of daily foot exam, annual eye examination, and good blood sugar, blood pressure and cholesterol control.  Diabetic Labs Latest Ref Rng 02/22/2015 10/26/2014 07/15/2014 07/14/2014 05/27/2013  HbA1c <5.7 % 8.4(H) 7.2(H) 6.8(H) - 7.7(H)  Microalbumin <2.0 mg/dL - - - 3.1(H) -  Micro/Creat Ratio 0.0 - 30.0 mg/g - - - 17.1 -  Chol 0 - 200 mg/dL - 148 159 - 158  HDL >=46 mg/dL - 42(L) 38(L) - 41  Calc LDL 0 - 99 mg/dL - 88 109(H) - 101(H)  Triglycerides <150 mg/dL - 88 59 - 82  Creatinine 0.50 - 1.10 mg/dL 0.72 0.87 0.88 - 0.89   BP/Weight 02/22/2015 10/24/2014 07/14/2014 05/31/2013 01/28/2013 02/21/4579 04/20/8337  Systolic BP 250 539 767 341 937 902 409  Diastolic BP 80 70 80 84 74 82 69  Wt. (Lbs) 190.04 193 192 190.12 189 194 190  BMI 34.75 35.29 35.11 34.76 34.56 35.47 34.74   Foot/eye exam completion dates 07/14/2014 10/08/2012  Eye Exam - -  Foot exam Order - -  Foot Form Completion Done Done

## 2015-03-10 NOTE — Progress Notes (Signed)
Subjective:    Patient ID: [name], female    DOB: [birth year], 39 y.o.   MRN: [id number]  HPI The PT is here for follow up and re-evaluation of chronic medical conditions, medication management and review of any available recent lab and radiology data.  Preventive health is updated, specifically  Cancer screening and Immunization.   Questions or concerns regarding consultations or procedures which the PT has had in the interim are  addressed. The PT states the metformin causes severe nausea and stomach cramps , she is unable to take and has not been taking janumet as a result as prescribed, states her blood sugar continues to be too high, and this concerns her, she does try to walk daily for exercise.concerned about lack of weight loss. Denies polyuria, polydipsia, blurred vision , or hypoglycemic episodes.      Review of Systems See HPI Denies recent fever or chills. Denies sinus pressure, nasal congestion, ear pain or sore throat. Denies chest congestion, productive cough or wheezing. Denies chest pains, palpitations and leg swelling Denies abdominal pain, nausea, vomiting,diarrhea or constipation.   Denies dysuria, frequency, hesitancy or incontinence. Denies joint pain, swelling and limitation in mobility. Denies headaches, seizures, numbness, or tingling. Denies depression, anxiety or insomnia. Denies skin break down or rash.        Objective:   Physical Exam BP 112/80 mmHg  Pulse 74  Resp 18  Ht 5\' 2"  (1.575 m)  Wt 190 lb 0.6 oz (86.202 kg)  BMI 34.75 kg/m2  SpO2 99%  Patient alert and oriented and in no cardiopulmonary distress.  HEENT: No facial asymmetry, EOMI,   oropharynx pink and moist.  Neck supple no JVD, no mass.  Chest: Clear to auscultation bilaterally.  CVS: S1, S2 no murmurs, no S3.Regular rate.  ABD: Soft non tender.   Ext: No edema  MS: Adequate ROM spine, shoulders, hips and knees.  Skin: Intact, no ulcerations or rash  noted.  Psych: Good eye contact, normal affect. Memory intact not anxious or depressed appearing.  CNS: CN 2-12 intact, power,  normal throughout.no focal deficits noted.        Assessment & Plan:  Essential hypertension Controlled, no change in medication DASH diet and commitment to daily physical activity for a minimum of 30 minutes discussed and encouraged, as a part of hypertension management. The importance of attaining a healthy weight is also discussed.  BP/Weight 02/22/2015 10/24/2014 07/14/2014 05/31/2013 01/28/2013 12/20/2583 09/18/7822  Systolic BP 235 361 443 154 008 676 195  Diastolic BP 80 70 80 84 74 82 69  Wt. (Lbs) 190.04 193 192 190.12 189 194 190  BMI 34.75 35.29 35.11 34.76 34.56 35.47 34.74        Diabetes mellitus type 2 in obese Deteriorated, and intolerant of metformin. Pt to get diabetic ed, commit to daily exercise and weight loss Start januvia , stop janumet. Call if blood sugars remain high, she will be referred to endo, as I believe she will benefit from being controlled on insulin only [name] is reminded of the importance of commitment to daily physical activity for 30 minutes or more, as able and the need to limit carbohydrate intake to 30 to 60 grams per meal to help with blood sugar control.   The need to take medication as prescribed, test blood sugar as directed, and to call between visits if there is a concern that blood sugar is uncontrolled is also discussed.   [name] is reminded of the  importance of daily foot exam, annual eye examination, and good blood sugar, blood pressure and cholesterol control.  Diabetic Labs Latest Ref Rng 02/22/2015 10/26/2014 07/15/2014 07/14/2014 05/27/2013  HbA1c <5.7 % 8.4(H) 7.2(H) 6.8(H) - 7.7(H)  Microalbumin <2.0 mg/dL - - - 3.1(H) -  Micro/Creat Ratio 0.0 - 30.0 mg/g - - - 17.1 -  Chol 0 - 200 mg/dL - 148 159 - 158  HDL >=46 mg/dL - 42(L) 38(L) - 41  Calc LDL 0 - 99 mg/dL - 88 109(H) - 101(H)    Triglycerides <150 mg/dL - 88 59 - 82  Creatinine 0.50 - 1.10 mg/dL 0.72 0.87 0.88 - 0.89   BP/Weight 02/22/2015 10/24/2014 07/14/2014 05/31/2013 01/28/2013 5/36/6440 10/14/7423  Systolic BP 956 387 564 332 951 884 166  Diastolic BP 80 70 80 84 74 82 69  Wt. (Lbs) 190.04 193 192 190.12 189 194 190  BMI 34.75 35.29 35.11 34.76 34.56 35.47 34.74   Foot/eye exam completion dates 07/14/2014 10/08/2012  Eye Exam - -  Foot exam Order - -  Foot Form Completion Done Done         Hyperlipidemia LDL goal <100 Controlled, no change in medication Hyperlipidemia:Low fat diet discussed and encouraged.   Lipid Panel  Lab Results  Component Value Date   CHOL 148 10/26/2014   HDL 42* 10/26/2014   LDLCALC 88 10/26/2014   TRIG 88 10/26/2014   CHOLHDL 3.5 10/26/2014   Updated lab needed at/ before next visit. Need sto commit to more regular exercise     Obesity, Class II, BMI 35-39.9, with comorbidity Unchanged. Patient re-educated about  the importance of commitment to a  minimum of 150 minutes of exercise per week.  The importance of healthy food choices with portion control discussed. Encouraged to start a food diary, count calories and to consider  joining a support group. Sample diet sheets offered. Goals set by the patient for the next several months.   Weight /BMI 02/22/2015 10/24/2014 07/14/2014  WEIGHT 190 lb 0.6 oz 193 lb 192 lb  HEIGHT 5\' 2"  5\' 2"  5\' 2"   BMI 34.75 kg/m2 35.29 kg/m2 35.11 kg/m2    Current exercise per week 150 minutes.   GERD (gastroesophageal reflux disease) Controlled, no change in medication

## 2015-03-10 NOTE — Assessment & Plan Note (Signed)
Controlled, no change in medication  

## 2015-03-10 NOTE — Assessment & Plan Note (Signed)
Unchanged. Patient re-educated about  the importance of commitment to a  minimum of 150 minutes of exercise per week.  The importance of healthy food choices with portion control discussed. Encouraged to start a food diary, count calories and to consider  joining a support group. Sample diet sheets offered. Goals set by the patient for the next several months.   Weight /BMI 02/22/2015 10/24/2014 07/14/2014  WEIGHT 190 lb 0.6 oz 193 lb 192 lb  HEIGHT 5\' 2"  5\' 2"  5\' 2"   BMI 34.75 kg/m2 35.29 kg/m2 35.11 kg/m2    Current exercise per week 150 minutes.

## 2015-03-10 NOTE — Assessment & Plan Note (Signed)
Controlled, no change in medication DASH diet and commitment to daily physical activity for a minimum of 30 minutes discussed and encouraged, as a part of hypertension management. The importance of attaining a healthy weight is also discussed.  BP/Weight 02/22/2015 10/24/2014 07/14/2014 05/31/2013 01/28/2013 3/66/8159 11/17/759  Systolic BP 518 343 735 789 784 784 128  Diastolic BP 80 70 80 84 74 82 69  Wt. (Lbs) 190.04 193 192 190.12 189 194 190  BMI 34.75 35.29 35.11 34.76 34.56 35.47 34.74

## 2015-03-13 ENCOUNTER — Telehealth: Payer: Self-pay | Admitting: *Deleted

## 2015-03-13 NOTE — Telephone Encounter (Signed)
Pt has a appt with Dr. Dorris Fetch 04/19/15 at 11:00

## 2015-03-31 ENCOUNTER — Other Ambulatory Visit: Payer: Self-pay | Admitting: Family Medicine

## 2015-04-21 ENCOUNTER — Encounter: Payer: BLUE CROSS/BLUE SHIELD | Attending: Family Medicine | Admitting: Nutrition

## 2015-04-21 ENCOUNTER — Encounter: Payer: Self-pay | Admitting: Nutrition

## 2015-04-21 VITALS — Ht 62.0 in | Wt 190.0 lb

## 2015-04-21 DIAGNOSIS — E669 Obesity, unspecified: Secondary | ICD-10-CM | POA: Diagnosis not present

## 2015-04-21 DIAGNOSIS — E1165 Type 2 diabetes mellitus with hyperglycemia: Secondary | ICD-10-CM

## 2015-04-21 DIAGNOSIS — E118 Type 2 diabetes mellitus with unspecified complications: Secondary | ICD-10-CM | POA: Insufficient documentation

## 2015-04-21 DIAGNOSIS — Z6834 Body mass index (BMI) 34.0-34.9, adult: Secondary | ICD-10-CM | POA: Insufficient documentation

## 2015-04-21 DIAGNOSIS — Z713 Dietary counseling and surveillance: Secondary | ICD-10-CM | POA: Diagnosis not present

## 2015-04-21 DIAGNOSIS — IMO0002 Reserved for concepts with insufficient information to code with codable children: Secondary | ICD-10-CM

## 2015-04-21 NOTE — Progress Notes (Signed)
  Medical Nutrition Therapy:  Appt start time: 0800 end time:  0900.  Assessment:  Primary concerns today: DIabetes Teype 2.. LIves with her husband and 2 children. Most recent A1C was 8%. On Janument 50/1000, Glyburide. Says she sometimes has low blood sugars symptoms of shaking and blurry vision and headaches when she doesn't eat. She does the cooking and shopping. Most foods are baked, grilled. Doesn't fry much. Eats out 1 per week-sit down restaurant. Doesn't eat a lot of fast foods. Physical activity: walking 30 minutes.    Lost 4 lbs since MD viist with Dr. Moshe Cipro office. Tests once a day. FBS's 140's. Drinks sweet tea but has cut back and planning on quitting. Willing to change behaviors and lifestyle to improve weight and blood sugars. Diet is excessive in calories and CHO and low in fresh fruits, vegetables and whole grains.  Preferred Learning Style:   No preference indicated   Learning Readiness:   Ready  Change in progress  MEDICATIONS: See list   DIETARY INTAKE:  24-hr recall:  B ( AM): Skips breakfast.  Snk ( AM): nabs  L ( PM): Steak biscuit, water Snk ( PM): banana D ( PM): chicken tenders, corn on cob, Sweet Tea 8 oz cup Snk ( PM):  Beverages: water, sweet tea,   Usual physical activity: walk some a few times per week.  Estimated energy needs: 1500 calories 170 g carbohydrates 112 g protein 42 g fat  Progress Towards Goal(s):  In progress.   Nutritional Diagnosis:   NB-1.1 Food and nutrition-related knowledge deficit As related to Diabets.  As evidenced by  A1C of 8%..    Intervention:  Nutrition and diabetes education provided on disease, CHO counting, meal planning, portion sizes, low sodium low fat high fiber diet, My Plate, target ranges for blood sugars and monitoring, foot care, eye and dental care and prevention of complications from DM.  Goals: 1. Follow My Plate. 2. Increase fresh fruits and vegetables 3. Cut out snacks between meals. 4.  Test blood sugars before breakfast and after dinner 2 hours and record on sheet. 5. Keep a food journal.  6. Get A1C down to 7% in three months. 7. Exercise 30 minutes three times per week 8. Lose 1-2 lbs per week.  Teaching Method Utilized:  Visual Auditory Hands on  Handouts given during visit include:  The Plate Method        Meal Plan Card        Weight loss tips  Barriers to learning/adherence to lifestyle change: none  Demonstrated degree of understanding via:  Teach Back   Monitoring/Evaluation:  Dietary intake, exercise, meal planning SBG, and body weight in 1 month(s).

## 2015-04-21 NOTE — Patient Instructions (Signed)
Goals 1. Follow Plate Method 2. Get FBS less than 130 in am. 3. Test blood sugars 2 hours after dinner 4. Continue walking 30 minutes everyday. 5. Lose 5 lbs by next visit. 6. Keep food journal and BS log and bring at next visit. 7. Get A1C to 6.5%  In three months.

## 2015-04-26 ENCOUNTER — Telehealth: Payer: Self-pay | Admitting: *Deleted

## 2015-04-26 NOTE — Telephone Encounter (Signed)
From [facility]. Will wait for patient to call in and request

## 2015-04-26 NOTE — Telephone Encounter (Signed)
[name] called for patient [name] regarding medical records for glucose moniter. Please advise [name] [phone] per [name] they have sent over a request

## 2015-05-01 ENCOUNTER — Other Ambulatory Visit: Payer: Self-pay | Admitting: Family Medicine

## 2015-05-16 ENCOUNTER — Telehealth: Payer: Self-pay

## 2015-05-16 NOTE — Telephone Encounter (Signed)
States last Thursday she spilled some boiling water down the front of her over her stomach and right breast. The stomach burn isn't healed but doesn't look as bad as the 1-2 inch burn on her right breast. No drainage but states the skin is still blistered and sore. Wants something called in for it. Please advise

## 2015-05-16 NOTE — Telephone Encounter (Signed)
Pt aware, scheduled for in the am

## 2015-05-16 NOTE — Telephone Encounter (Signed)
OV in am please

## 2015-05-17 ENCOUNTER — Encounter: Payer: Self-pay | Admitting: Family Medicine

## 2015-05-17 ENCOUNTER — Ambulatory Visit (INDEPENDENT_AMBULATORY_CARE_PROVIDER_SITE_OTHER): Payer: BLUE CROSS/BLUE SHIELD | Admitting: Family Medicine

## 2015-05-17 ENCOUNTER — Ambulatory Visit: Payer: Self-pay | Admitting: Family Medicine

## 2015-05-17 VITALS — BP 120/80 | HR 82 | Resp 16 | Ht 62.0 in | Wt 187.0 lb

## 2015-05-17 DIAGNOSIS — I10 Essential (primary) hypertension: Secondary | ICD-10-CM

## 2015-05-17 DIAGNOSIS — T300 Burn of unspecified body region, unspecified degree: Secondary | ICD-10-CM | POA: Insufficient documentation

## 2015-05-17 DIAGNOSIS — IMO0001 Reserved for inherently not codable concepts without codable children: Secondary | ICD-10-CM

## 2015-05-17 DIAGNOSIS — T2121XA Burn of second degree of chest wall, initial encounter: Secondary | ICD-10-CM | POA: Diagnosis not present

## 2015-05-17 DIAGNOSIS — Z23 Encounter for immunization: Secondary | ICD-10-CM

## 2015-05-17 MED ORDER — SILVER SULFADIAZINE 1 % EX CREA: 1.0000 "application " | TOPICAL_CREAM | Freq: Every day | CUTANEOUS | Status: DC

## 2015-05-17 MED ORDER — SULFAMETHOXAZOLE-TRIMETHOPRIM 800-160 MG PO TABS: 1.0000 | ORAL_TABLET | Freq: Two times a day (BID) | ORAL | Status: DC

## 2015-05-17 MED ORDER — FLUCONAZOLE 150 MG PO TABS: 150.0000 mg | ORAL_TABLET | Freq: Once | ORAL | Status: DC

## 2015-05-17 NOTE — Assessment & Plan Note (Signed)
After obtaining informed consent, the vaccine is  administered by LPN.  

## 2015-05-17 NOTE — Assessment & Plan Note (Signed)
Improved Patient re-educated about  the importance of commitment to a  minimum of 150 minutes of exercise per week.  The importance of healthy food choices with portion control discussed. Encouraged to start a food diary, count calories and to consider  joining a support group. Sample diet sheets offered. Goals set by the patient for the next several months.   Weight /BMI 05/17/2015 04/21/2015 02/22/2015  WEIGHT 187 lb 190 lb 190 lb 0.6 oz  HEIGHT 5\' 2"  5\' 2"  5\' 2"   BMI 34.19 kg/m2 34.74 kg/m2 34.75 kg/m2    Current exercise per week120 minutes.

## 2015-05-17 NOTE — Assessment & Plan Note (Signed)
Currently no sign of bacterial superinfection. Apply silvadene daily and keep area clean and dry. Scri[pt provided for 7 day course of antibiotic in the event of bacterial superinfection

## 2015-05-17 NOTE — Progress Notes (Signed)
   Subjective:    Patient ID: [name], female    DOB: Dec 26, 1975, 39 y.o.   MRN: [id number]  HPI 1 week ago while cooking, boiling water accidentally spilled on right breast and anterior chest. Blister on right breast opened yesterday and she is here for evaluation of the injury. Denies fever or  purulent drainage, no direct trauma aggravated the skin breakdown Reports improved blood sugars and has had weight loss as her diabetes is being better  Controlled Now that she sees an endocrinologist    Review of Systems See HPI Denies recent fever or chills. Denies sinus pressure, nasal congestion, ear pain or sore throat. Denies chest congestion, productive cough or wheezing. Denies chest pains, palpitations and leg swelling Denies abdominal pain, nausea, vomiting,diarrhea or constipation.   Denies dysuria, frequency, hesitancy or incontinence. Denies joint pain, swelling and limitation in mobility. Denies headaches, seizures, numbness, or tingling. Denies depression, anxiety or insomnia.      Objective:   Physical Exam  BP 120/80 mmHg  Pulse 82  Resp 16  Ht 5\' 2"  (1.575 m)  Wt 187 lb (84.823 kg)  BMI 34.19 kg/m2  SpO2 99%  Patient alert and oriented and in no cardiopulmonary distress.  HEENT: No facial asymmetry, EOMI,   oropharynx pink and moist.  Neck supple no JVD, no mass.  Chest: Clear to auscultation bilaterally.  CVS: S1, S2 no murmurs, no S3.Regular rate.  ABD: Soft non tender.   Ext: No edema  MS: Adequate ROM spine, shoulders, hips and knees.  Skin:2nd degree burn on right breast, max diameter approx 12 cm, no surrounding warmth, redness or tenderness, no drainage from wound  Psych: Good eye contact, normal affect. Memory intact not anxious or depressed appearing.  CNS: CN 2-12 intact, power,  normal throughout.no focal deficits noted.       Assessment & Plan:  Blisters with epidermal loss due to burn (second degree) of breast Currently  no sign of bacterial superinfection. Apply silvadene daily and keep area clean and dry. Scri[pt provided for 7 day course of antibiotic in the event of bacterial superinfection  Essential hypertension Controlled, no change in medication DASH diet and commitment to daily physical activity for a minimum of 30 minutes discussed and encouraged, as a part of hypertension management. The importance of attaining a healthy weight is also discussed.  BP/Weight 05/17/2015 04/21/2015 02/22/2015 10/24/2014 07/14/2014 05/31/2013 3/53/6144  Systolic BP 315 - 400 867 619 509 326  Diastolic BP 80 - 80 70 80 84 74  Wt. (Lbs) 187 190 190.04 193 192 190.12 189  BMI 34.19 34.74 34.75 35.29 35.11 34.76 34.56        Obesity, Class II, BMI 35-39.9, with comorbidity Improved Patient re-educated about  the importance of commitment to a  minimum of 150 minutes of exercise per week.  The importance of healthy food choices with portion control discussed. Encouraged to start a food diary, count calories and to consider  joining a support group. Sample diet sheets offered. Goals set by the patient for the next several months.   Weight /BMI 05/17/2015 04/21/2015 02/22/2015  WEIGHT 187 lb 190 lb 190 lb 0.6 oz  HEIGHT 5\' 2"  5\' 2"  5\' 2"   BMI 34.19 kg/m2 34.74 kg/m2 34.75 kg/m2    Current exercise per week120 minutes.   Need for prophylactic vaccination and inoculation against influenza After obtaining informed consent, the vaccine is  administered by LPN.

## 2015-05-17 NOTE — Patient Instructions (Signed)
F/u in November as before, call if you need me sooner  The burn on your breast ( right) is not infected, keep it clean and dry, and use the cream prescribed to help with healing  IF your breast , or any other area that was burned, starts hurting, or becomes warm and red, you need to start the antibiotic septra that is prescribed. If the conditions worsen rather than improves, you need to go to urgent care or the ED. Skin infections , especially in a place like the breast , are potentially  very dangerous. Please work on good  health habits so that your health will improve. 1. Commitment to daily physical activity for 30 to 60  minutes, if you are able to do this.  2. Commitment to wise food choices. Aim for half of your  food intake to be vegetable and fruit, one quarter starchy foods, and one quarter protein. Try to eat on a regular schedule  3 meals per day, snacking between meals should be limited to vegetables or fruits or small portions of nuts. 64 ounces of water per day is generally recommended, unless you have specific health conditions, like heart failure or kidney failure where you will need to limit fluid intake.  3. Commitment to sufficient and a  good quality of physical and mental rest daily, generally between 6 to 8 hours per day.  WITH PERSISTANCE AND PERSEVERANCE, THE IMPOSSIBLE , BECOMES THE NORM! Thanks for choosing [facility], we consider it a privelige to serve you.

## 2015-05-17 NOTE — Assessment & Plan Note (Signed)
Controlled, no change in medication DASH diet and commitment to daily physical activity for a minimum of 30 minutes discussed and encouraged, as a part of hypertension management. The importance of attaining a healthy weight is also discussed.  BP/Weight 05/17/2015 04/21/2015 02/22/2015 10/24/2014 07/14/2014 05/31/2013 03/31/6014  Systolic BP 615 - 379 432 761 470 929  Diastolic BP 80 - 80 70 80 84 74  Wt. (Lbs) 187 190 190.04 193 192 190.12 189  BMI 34.19 34.74 34.75 35.29 35.11 34.76 34.56

## 2015-05-29 ENCOUNTER — Ambulatory Visit: Payer: BLUE CROSS/BLUE SHIELD | Admitting: Nutrition

## 2015-05-30 ENCOUNTER — Other Ambulatory Visit: Payer: Self-pay | Admitting: Family Medicine

## 2015-06-12 ENCOUNTER — Ambulatory Visit: Payer: BLUE CROSS/BLUE SHIELD | Admitting: Nutrition

## 2015-06-29 ENCOUNTER — Other Ambulatory Visit: Payer: Self-pay | Admitting: Family Medicine

## 2015-07-10 LAB — HEMOGLOBIN A1C: Hgb A1c MFr Bld: 12.1 % — AB (ref 4.0–6.0)

## 2015-07-12 ENCOUNTER — Ambulatory Visit (INDEPENDENT_AMBULATORY_CARE_PROVIDER_SITE_OTHER): Payer: BLUE CROSS/BLUE SHIELD | Admitting: Family Medicine

## 2015-07-12 ENCOUNTER — Encounter: Payer: Self-pay | Admitting: Family Medicine

## 2015-07-12 VITALS — BP 126/82 | HR 78 | Resp 18 | Ht 62.0 in | Wt 177.0 lb

## 2015-07-12 DIAGNOSIS — I10 Essential (primary) hypertension: Secondary | ICD-10-CM

## 2015-07-12 DIAGNOSIS — E119 Type 2 diabetes mellitus without complications: Secondary | ICD-10-CM | POA: Diagnosis not present

## 2015-07-12 DIAGNOSIS — E785 Hyperlipidemia, unspecified: Secondary | ICD-10-CM

## 2015-07-12 DIAGNOSIS — B353 Tinea pedis: Secondary | ICD-10-CM

## 2015-07-12 DIAGNOSIS — E1169 Type 2 diabetes mellitus with other specified complication: Secondary | ICD-10-CM

## 2015-07-12 DIAGNOSIS — E669 Obesity, unspecified: Secondary | ICD-10-CM

## 2015-07-12 DIAGNOSIS — IMO0001 Reserved for inherently not codable concepts without codable children: Secondary | ICD-10-CM

## 2015-07-12 MED ORDER — TERBINAFINE HCL 250 MG PO TABS: 250.0000 mg | ORAL_TABLET | Freq: Every day | ORAL | Status: DC

## 2015-07-12 MED ORDER — PRAVASTATIN SODIUM 10 MG PO TABS: 10.0000 mg | ORAL_TABLET | Freq: Every day | ORAL | Status: DC

## 2015-07-12 NOTE — Patient Instructions (Signed)
F/u in 4 month, call if you need me before  CONGRATS on markedly improved health  Foot exam shows fungal toenail infection , I am sending in 3 months of medication  Return 1 day next week urine for yearly testing, Dec 4 or after  It is important that you exercise regularly at least 30 minutes 7 times a week. If you develop chest pain, have severe difficulty breathing, or feel very tired, stop exercising immediately and seek medical attention   Fasting lipid, cmp and EGFR, and cBC in 4 month  Weight loss goal of 8 to 10 pounds in 4 month  Thanks for choosing [facility], we consider it a privelige to serve you.   All the best for 2017!

## 2015-07-12 NOTE — Progress Notes (Signed)
Subjective:    Patient ID: [name], female    DOB: 05-31-1976, 39 y.o.   MRN: [id number]  HPI   [name]     MRN: [id number]      DOB: 12-Feb-1976   HPI Ms. Krautkramer is here for follow up and re-evaluation of chronic medical conditions, medication management and review of any available recent lab and radiology data.  Preventive health is updated, specifically  Cancer screening and Immunization.   Questions or concerns regarding consultations or procedures which the PT has had in the interim are  Addressed. Reports marked improvement in blood sugar results with change in diet. Has appt this week with endo. Committed to 5 day epr week exercise  Denies polyuria, polydipsia, blurred vision , or hypoglycemic episodes.  The PT denies any adverse reactions to current medications since the last visit.  ROS Denies recent fever or chills. Denies sinus pressure, nasal congestion, ear pain or sore throat. Denies chest congestion, productive cough or wheezing. Denies chest pains, palpitations and leg swelling Denies abdominal pain, nausea, vomiting,diarrhea or constipation.   Denies dysuria, frequency, hesitancy or incontinence. Denies joint pain, swelling and limitation in mobility. Denies headaches, seizures, numbness, or tingling. Denies depression, anxiety or insomnia. Denies skin break down or rash.   PE  BP 126/82 mmHg  Pulse 78  Resp 18  Ht 5\' 2"  (1.575 m)  Wt 177 lb (80.287 kg)  BMI 32.37 kg/m2  SpO2 99%  LMP 06/13/2015 (Approximate)  Patient alert and oriented and in no cardiopulmonary distress.  HEENT: No facial asymmetry, EOMI,   oropharynx pink and moist.  Neck supple no JVD, no mass.  Chest: Clear to auscultation bilaterally.  CVS: S1, S2 no murmurs, no S3.Regular rate.  ABD: Soft non tender.   Ext: No edema  MS: Adequate ROM spine, shoulders, hips and knees.  Skin: Intact, no ulcerations or rash noted.bilateral onychomycosis  Psych:  Good eye contact, normal affect. Memory intact not anxious or depressed appearing.  CNS: CN 2-12 intact, power,  normal throughout.no focal deficits noted.   Assessment & Plan   Essential hypertension Controlled, no change in medication DASH diet and commitment to daily physical activity for a minimum of 30 minutes discussed and encouraged, as a part of hypertension management. The importance of attaining a healthy weight is also discussed.  BP/Weight 07/14/2015 07/12/2015 05/17/2015 04/21/2015 02/22/2015 10/24/2014 [id number]  Systolic BP [id number] [id number] - [id number] [id number]  Diastolic BP 80 82 80 - 80 70 80  Wt. (Lbs) 179 177 187 190 190.04 193 192  BMI 32.73 32.37 34.19 34.74 34.75 35.29 35.11        Diabetes mellitus type 2 in obese Managed by endo, though pt reports improved sense of wellbeing, deterioration . Endo to manage  Tinea pedis Bilateral fungal foot and toenail infection, 3 month course of oral medication  Hyperlipidemia Hyperlipidemia:Low fat diet discussed and encouraged.   Lipid Panel  Lab Results  Component Value Date   CHOL 148 10/26/2014   HDL 42* 10/26/2014   LDLCALC 88 10/26/2014   TRIG 88 10/26/2014   CHOLHDL 3.5 10/26/2014   Updated lab needed at/ before next visit.      Obesity, Class II, BMI 35-39.9, with comorbidity Improved, unfortunately appeards due to uncontrolled diabetes Patient re-educated about  the importance of commitment to a  minimum of 150 minutes of exercise per week.  The importance of healthy food choices with portion control discussed. Encouraged to start a food  diary, count calories and to consider  joining a support group. Sample diet sheets offered. Goals set by the patient for the next several months.   Weight /BMI 07/14/2015 07/12/2015 05/17/2015  WEIGHT 179 lb 177 lb 187 lb  HEIGHT 5\' 2"  5\' 2"  5\' 2"   BMI 32.73 kg/m2 32.37 kg/m2 34.19 kg/m2    Current exercise per week 150 minutes.        Review of Systems       Objective:   Physical Exam        Assessment & Plan:

## 2015-07-14 ENCOUNTER — Encounter: Payer: Self-pay | Admitting: "Endocrinology

## 2015-07-14 ENCOUNTER — Ambulatory Visit (INDEPENDENT_AMBULATORY_CARE_PROVIDER_SITE_OTHER): Payer: BLUE CROSS/BLUE SHIELD | Admitting: "Endocrinology

## 2015-07-14 VITALS — BP 127/80 | HR 95 | Ht 62.0 in | Wt 179.0 lb

## 2015-07-14 DIAGNOSIS — E559 Vitamin D deficiency, unspecified: Secondary | ICD-10-CM

## 2015-07-14 DIAGNOSIS — E119 Type 2 diabetes mellitus without complications: Secondary | ICD-10-CM

## 2015-07-14 DIAGNOSIS — E669 Obesity, unspecified: Secondary | ICD-10-CM

## 2015-07-14 DIAGNOSIS — I10 Essential (primary) hypertension: Secondary | ICD-10-CM

## 2015-07-14 DIAGNOSIS — E1169 Type 2 diabetes mellitus with other specified complication: Secondary | ICD-10-CM

## 2015-07-14 DIAGNOSIS — E785 Hyperlipidemia, unspecified: Secondary | ICD-10-CM | POA: Diagnosis not present

## 2015-07-14 MED ORDER — INSULIN GLARGINE 300 UNIT/ML ~~LOC~~ SOPN: 20.0000 [IU] | PEN_INJECTOR | Freq: Every day | SUBCUTANEOUS | Status: DC

## 2015-07-14 MED ORDER — VITAMIN D (ERGOCALCIFEROL) 1.25 MG (50000 UNIT) PO CAPS: 50000.0000 [IU] | ORAL_CAPSULE | ORAL | Status: DC

## 2015-07-14 NOTE — Patient Instructions (Signed)

## 2015-07-15 NOTE — Assessment & Plan Note (Signed)
Managed by endo, though pt reports improved sense of wellbeing, deterioration . Endo to manage

## 2015-07-15 NOTE — Assessment & Plan Note (Signed)
Improved, unfortunately appeards due to uncontrolled diabetes Patient re-educated about  the importance of commitment to a  minimum of 150 minutes of exercise per week.  The importance of healthy food choices with portion control discussed. Encouraged to start a food diary, count calories and to consider  joining a support group. Sample diet sheets offered. Goals set by the patient for the next several months.   Weight /BMI 07/14/2015 07/12/2015 05/17/2015  WEIGHT 179 lb 177 lb 187 lb  HEIGHT 5\' 2"  5\' 2"  5\' 2"   BMI 32.73 kg/m2 32.37 kg/m2 34.19 kg/m2    Current exercise per week 150 minutes.

## 2015-07-15 NOTE — Assessment & Plan Note (Signed)
Bilateral fungal foot and toenail infection, 3 month course of oral medication

## 2015-07-15 NOTE — Progress Notes (Signed)
Subjective:    Patient ID: [name], female    DOB: 1975-10-14, PCP [name], MD   Past Medical History  Diagnosis Date  . GERD (gastroesophageal reflux disease)   . Hypertension   . Constipation   . Morbid obesity ([place])   . NIDDM (non-insulin dependent diabetes mellitus) [id number]    no complications , takes POs when not pregnant   Past Surgical History  Procedure Laterality Date  . Cesarean section  6/09  . Back surgery  05/25/2010    Dr,Kabell    Social History   Social History  . Marital Status: Married    Spouse Name: N/A  . Number of Children: N/A  . Years of Education: N/A   Occupational History  . unemployed     Social History Main Topics  . Smoking status: Never Smoker   . Smokeless tobacco: Never Used  . Alcohol Use: No  . Drug Use: No  . Sexual Activity: Yes    Birth Control/ Protection: None   Other Topics Concern  . None   Social History Narrative   Outpatient Encounter Prescriptions as of 07/14/2015  Medication Sig  . hydrochlorothiazide (HYDRODIURIL) 25 MG tablet Take 1 tablet (25 mg total) by mouth daily.  Marland Kitchen omeprazole (PRILOSEC) 20 MG capsule TAKE 1 CAPSULE BY MOUTH EVERY DAY  . pravastatin (PRAVACHOL) 10 MG tablet Take 1 tablet (10 mg total) by mouth at bedtime.  . propranolol (INDERAL) 40 MG tablet Take 1 tablet (40 mg total) by mouth 2 (two) times daily.  . sitaGLIPtin-metformin (JANUMET) 50-1000 MG per tablet Take 1 tablet by mouth 2 (two) times daily with a meal.  . terbinafine (LAMISIL) 250 MG tablet Take 1 tablet (250 mg total) by mouth daily.  . Insulin Glargine (TOUJEO SOLOSTAR) 300 UNIT/ML SOPN Inject 20 Units into the skin at bedtime.  . Vitamin D, Ergocalciferol, (DRISDOL) 50000 UNITS CAPS capsule Take 1 capsule (50,000 Units total) by mouth every 7 (seven) days.   No facility-administered encounter medications on file as of 07/14/2015.   ALLERGIES: Allergies  Allergen Reactions  . Ace Inhibitors Cough  .  Amlodipine Besylate     REACTION: severe edema of legs   VACCINATION STATUS: Immunization History  Administered Date(s) Administered  . Influenza Split 05/20/2011, 05/17/2014  . Influenza Whole 06/09/2006, 05/02/2008, 03/06/2010  . Influenza,inj,Quad PF,36+ Mos 05/17/2015  . Pneumococcal Polysaccharide-23 08/14/2009  . Td 12/05/2009    Diabetes She presents for her follow-up diabetic visit. She has type 2 diabetes mellitus. Onset time: She was diagnosed at age of 26 years. Her disease course has been worsening. There are no hypoglycemic associated symptoms. Pertinent negatives for hypoglycemia include no confusion, headaches, pallor or seizures. Associated symptoms include fatigue, polydipsia and polyuria. Pertinent negatives for diabetes include no blurred vision, no chest pain and no polyphagia. There are no hypoglycemic complications. Symptoms are worsening. There are no diabetic complications. Risk factors for coronary artery disease include diabetes mellitus, dyslipidemia, hypertension, obesity and sedentary lifestyle. Current diabetic treatment includes oral agent (dual therapy). She is compliant with treatment most of the time. Her weight is stable. She is following a generally unhealthy diet. She has had a previous visit with a dietitian. She never participates in exercise. Home blood sugar record trend: Her A1c increased from 8.4% to 12 .1%. An ACE inhibitor/angiotensin II receptor blocker is contraindicated.  Hyperlipidemia This is a chronic problem. The current episode started more than 1 year ago. Pertinent negatives include no chest pain,  myalgias or shortness of breath. Current antihyperlipidemic treatment includes statins. Risk factors for coronary artery disease include dyslipidemia, diabetes mellitus, hypertension, obesity and a sedentary lifestyle.  Hypertension This is a chronic problem. The current episode started more than 1 year ago. Pertinent negatives include no blurred  vision, chest pain, headaches, palpitations or shortness of breath. Past treatments include diuretics. The current treatment provides moderate improvement.     Review of Systems  Constitutional: Positive for fatigue. Negative for unexpected weight change.  HENT: Negative for trouble swallowing and voice change.   Eyes: Negative for blurred vision and visual disturbance.  Respiratory: Negative for cough, shortness of breath and wheezing.   Cardiovascular: Negative for chest pain, palpitations and leg swelling.  Gastrointestinal: Negative for nausea, vomiting and diarrhea.  Endocrine: Positive for polydipsia and polyuria. Negative for cold intolerance, heat intolerance and polyphagia.  Musculoskeletal: Negative for myalgias and arthralgias.  Skin: Negative for color change, pallor, rash and wound.  Neurological: Negative for seizures and headaches.  Psychiatric/Behavioral: Negative for suicidal ideas and confusion.    Objective:    BP 127/80 mmHg  Pulse 95  Ht 5\' 2"  (1.575 m)  Wt 179 lb (81.194 kg)  BMI 32.73 kg/m2  SpO2 99%  LMP 06/13/2015 (Approximate)  Wt Readings from Last 3 Encounters:  07/14/15 179 lb (81.194 kg)  07/12/15 177 lb (80.287 kg)  05/17/15 187 lb (84.823 kg)    Physical Exam  Constitutional: She is oriented to person, place, and time. She appears well-developed.  HENT:  Head: Normocephalic and atraumatic.  Eyes: EOM are normal.  Neck: Normal range of motion. Neck supple. No tracheal deviation present. No thyromegaly present.  Cardiovascular: Normal rate and regular rhythm.   Pulmonary/Chest: Effort normal and breath sounds normal.  Abdominal: Soft. Bowel sounds are normal. There is no tenderness. There is no guarding.  Musculoskeletal: Normal range of motion. She exhibits no edema.  Neurological: She is alert and oriented to person, place, and time. She has normal reflexes. No cranial nerve deficit. Coordination normal.  Skin: Skin is warm and dry. No rash  noted. No erythema. No pallor.  Psychiatric: She has a normal mood and affect. Judgment normal.    Results for orders placed or performed in visit on 07/14/15  Hemoglobin A1c  Result Value Ref Range   Hgb A1c MFr Bld 12.1 (A) 4.0 - 6.0 %   Complete Blood Count (Most recent): Lab Results  Component Value Date   WBC 8.5 07/15/2014   HGB 11.8* 07/15/2014   HCT 36.0 07/15/2014   MCV 80.5 07/15/2014   PLT 231 07/15/2014   Chemistry (most recent): Lab Results  Component Value Date   NA 138 02/22/2015   K 3.4* 02/22/2015   CL 97 02/22/2015   CO2 31 02/22/2015   BUN 10 02/22/2015   CREATININE 0.72 02/22/2015   Diabetic Labs (most recent): Lab Results  Component Value Date   HGBA1C 12.1* 07/10/2015   HGBA1C 8.4* 02/22/2015   HGBA1C 7.2* 10/26/2014   Lipid profile (most recent): Lab Results  Component Value Date   TRIG 88 10/26/2014   CHOL 148 10/26/2014         Assessment & Plan:   1. Diabetes mellitus type 2 in obese [facility])  - patient remains at a high risk for more acute and chronic complications of diabetes which include CAD, CVA, CKD, retinopathy, and neuropathy. These are all discussed in detail with the patient.  Patient came with increased A1c of 12.1%. Recent labs reviewed.   -  I have re-counseled the patient on diet management and weight loss  by adopting a carbohydrate restricted / protein rich  Diet.  - Suggestion is made for patient to avoid simple carbohydrates   from their diet including Cakes , Desserts, Ice Cream,  Soda (  diet and regular) , Sweet Tea , Candies,  Chips, Cookies, Artificial Sweeteners,   and "Sugar-free" Products .  This will help patient to have stable blood glucose profile and potentially avoid unintended  Weight gain.  - Patient is advised to stick to a routine mealtimes to eat 3 meals  a day and avoid unnecessary snacks ( to snack only to correct hypoglycemia).  - The patient  has been  scheduled with Jearld Fenton, RDN, CDE for  individualized DM education.  - I have approached patient with the following individualized plan to manage diabetes and patient agrees.  - I will initiate basal insulin Toujeo 20 units QHS, associated with strict monitoring of glucose  AC and HS. -She will return in 1 week for reevaluation. -She will likely need prandial insulin based on her commitment and her blood glucose readings.  -Patient is encouraged to call clinic for blood glucose levels less than 70 or above 300 mg /dl.  - I will continue Janumet 50/1000mg  po BID, therapeutically suitable for patient.   - Patient specific target  for A1c; LDL, HDL, Triglycerides, and  Waist Circumference were discussed in detail.  2) BP/HTN: Controlled. Continue current medications. 3) Lipids/HPL:  continue statins.  4)  Weight/Diet: CDE consult in progress, exercise, and carbohydrates information provided.  5) Vitamin D deficiency -I would initiate vitamin D 50,000 units weekly for the next 12 weeks.  6) Chronic Care/Health Maintenance:  -Patient is on a Statin medications (allergic to ACE inhibitors) and encouraged to continue to follow up with Ophthalmology, Podiatrist at least yearly or according to recommendations, and advised to  stay away from smoking. I have recommended yearly flu vaccine and pneumonia vaccination at least every 5 years; moderate intensity exercise for up to 150 minutes weekly; and  sleep for at least 7 hours a day.  - 25 minutes of time was spent on the care of this patient , 50% of which was applied for counseling on diabetes complications and their preventions.  - I advised patient to maintain close follow up with [name], MD for primary care needs.  Patient is asked to bring meter and  blood glucose logs during their next visit.   Follow up plan: -Return in about 1 week (around 07/21/2015) for diabetes, high blood pressure, high cholesterol.  Glade Lloyd, MD Phone: [phone]  Fax: [phone]    07/15/2015, 3:52 AM

## 2015-07-15 NOTE — Assessment & Plan Note (Signed)
Hyperlipidemia:Low fat diet discussed and encouraged.   Lipid Panel  Lab Results  Component Value Date   CHOL 148 10/26/2014   HDL 42* 10/26/2014   LDLCALC 88 10/26/2014   TRIG 88 10/26/2014   CHOLHDL 3.5 10/26/2014   Updated lab needed at/ before next visit.

## 2015-07-15 NOTE — Assessment & Plan Note (Signed)
Controlled, no change in medication DASH diet and commitment to daily physical activity for a minimum of 30 minutes discussed and encouraged, as a part of hypertension management. The importance of attaining a healthy weight is also discussed.  BP/Weight 07/14/2015 07/12/2015 05/17/2015 04/21/2015 02/22/2015 10/24/2014 [id number]  Systolic BP [id number] - [id number]  Diastolic BP 80 82 80 - 80 70 80  Wt. (Lbs) 179 177 187 190 190.04 193 192  BMI 32.73 32.37 34.19 34.74 34.75 35.29 35.11

## 2015-07-20 NOTE — Addendum Note (Signed)
Addended by: Denman George B on: 07/20/2015 09:58 AM   Modules accepted: Orders

## 2015-07-21 ENCOUNTER — Other Ambulatory Visit: Payer: Self-pay | Admitting: "Endocrinology

## 2015-07-21 ENCOUNTER — Telehealth: Payer: Self-pay

## 2015-07-21 LAB — MICROALBUMIN / CREATININE URINE RATIO
CREATININE, URINE: 117 mg/dL (ref 20–320)
Microalb Creat Ratio: 9 mcg/mg creat (ref <30)
Microalb, Ur: 1 mg/dL

## 2015-07-21 MED ORDER — INSULIN GLARGINE 100 UNIT/ML SOLOSTAR PEN: 20.0000 [IU] | PEN_INJECTOR | Freq: Every day | SUBCUTANEOUS | Status: DC

## 2015-07-21 NOTE — Telephone Encounter (Signed)
I sent a prescription for Lantus for her to use in place of toujeo  after she finishes her current sample.

## 2015-07-21 NOTE — Telephone Encounter (Signed)
Pts insurance will not cover Toujeo. She needs a new insulin prescription.

## 2015-07-24 ENCOUNTER — Other Ambulatory Visit: Payer: Self-pay

## 2015-07-24 MED ORDER — INSULIN PEN NEEDLE 31G X 5 MM MISC: 1.0000 | Freq: Every day | Status: DC

## 2015-07-25 ENCOUNTER — Encounter: Payer: Self-pay | Admitting: "Endocrinology

## 2015-07-25 ENCOUNTER — Ambulatory Visit (INDEPENDENT_AMBULATORY_CARE_PROVIDER_SITE_OTHER): Payer: BLUE CROSS/BLUE SHIELD | Admitting: "Endocrinology

## 2015-07-25 VITALS — BP 125/85 | HR 81 | Ht 62.0 in | Wt 182.0 lb

## 2015-07-25 DIAGNOSIS — E669 Obesity, unspecified: Secondary | ICD-10-CM

## 2015-07-25 DIAGNOSIS — E119 Type 2 diabetes mellitus without complications: Secondary | ICD-10-CM | POA: Diagnosis not present

## 2015-07-25 DIAGNOSIS — I10 Essential (primary) hypertension: Secondary | ICD-10-CM | POA: Diagnosis not present

## 2015-07-25 DIAGNOSIS — E785 Hyperlipidemia, unspecified: Secondary | ICD-10-CM | POA: Diagnosis not present

## 2015-07-25 DIAGNOSIS — E1169 Type 2 diabetes mellitus with other specified complication: Secondary | ICD-10-CM

## 2015-07-25 MED ORDER — INSULIN GLARGINE 100 UNIT/ML SOLOSTAR PEN: 30.0000 [IU] | PEN_INJECTOR | Freq: Every day | SUBCUTANEOUS | Status: DC

## 2015-07-25 NOTE — Patient Instructions (Signed)

## 2015-07-25 NOTE — Progress Notes (Signed)
Subjective:    Patient ID: [name], female    DOB: 06/23/1976, PCP [name], MD   Past Medical History  Diagnosis Date  . GERD (gastroesophageal reflux disease)   . Hypertension   . Constipation   . Morbid obesity ([place])   . NIDDM (non-insulin dependent diabetes mellitus) [id number]    no complications , takes POs when not pregnant   Past Surgical History  Procedure Laterality Date  . Cesarean section  6/09  . Back surgery  05/25/2010    Dr,Kabell    Social History   Social History  . Marital Status: Married    Spouse Name: N/A  . Number of Children: N/A  . Years of Education: N/A   Occupational History  . unemployed     Social History Main Topics  . Smoking status: Never Smoker   . Smokeless tobacco: Never Used  . Alcohol Use: No  . Drug Use: No  . Sexual Activity: Yes    Birth Control/ Protection: None   Other Topics Concern  . None   Social History Narrative   Outpatient Encounter Prescriptions as of 07/25/2015  Medication Sig  . hydrochlorothiazide (HYDRODIURIL) 25 MG tablet Take 1 tablet (25 mg total) by mouth daily.  . Insulin Glargine (LANTUS SOLOSTAR) 100 UNIT/ML Solostar Pen Inject 30 Units into the skin daily at 10 pm.  . Insulin Pen Needle (B-D UF III MINI PEN NEEDLES) 31G X 5 MM MISC 1 each by Does not apply route at bedtime.  Marland Kitchen omeprazole (PRILOSEC) 20 MG capsule TAKE 1 CAPSULE BY MOUTH EVERY DAY  . pravastatin (PRAVACHOL) 10 MG tablet Take 1 tablet (10 mg total) by mouth at bedtime.  . propranolol (INDERAL) 40 MG tablet Take 1 tablet (40 mg total) by mouth 2 (two) times daily.  . sitaGLIPtin-metformin (JANUMET) 50-1000 MG per tablet Take 1 tablet by mouth 2 (two) times daily with a meal.  . terbinafine (LAMISIL) 250 MG tablet Take 1 tablet (250 mg total) by mouth daily.  . Vitamin D, Ergocalciferol, (DRISDOL) 50000 UNITS CAPS capsule Take 1 capsule (50,000 Units total) by mouth every 7 (seven) days.  . [DISCONTINUED] Insulin  Glargine (LANTUS SOLOSTAR) 100 UNIT/ML Solostar Pen Inject 20 Units into the skin daily at 10 pm.   No facility-administered encounter medications on file as of 07/25/2015.   ALLERGIES: Allergies  Allergen Reactions  . Ace Inhibitors Cough  . Amlodipine Besylate     REACTION: severe edema of legs   VACCINATION STATUS: Immunization History  Administered Date(s) Administered  . Influenza Split 05/20/2011, 05/17/2014  . Influenza Whole 06/09/2006, 05/02/2008, 03/06/2010  . Influenza,inj,Quad PF,36+ Mos 05/17/2015  . Pneumococcal Polysaccharide-23 08/14/2009  . Td 12/05/2009    Diabetes She presents for her follow-up diabetic visit. She has type 2 diabetes mellitus. Onset time: She was diagnosed at age of 39 years. Her disease course has been worsening. There are no hypoglycemic associated symptoms. Pertinent negatives for hypoglycemia include no confusion, headaches, pallor or seizures. Associated symptoms include fatigue, polydipsia and polyuria. Pertinent negatives for diabetes include no blurred vision, no chest pain and no polyphagia. There are no hypoglycemic complications. Symptoms are improving (Improving but still persistently above target blood glucose profile.). There are no diabetic complications. Risk factors for coronary artery disease include diabetes mellitus, dyslipidemia, hypertension, obesity and sedentary lifestyle. Current diabetic treatment includes oral agent (dual therapy). She is compliant with treatment most of the time. Her weight is stable. She is following a generally unhealthy  diet. She has had a previous visit with a dietitian. She never participates in exercise. Home blood sugar record trend: Her A1c increased from 8.4% to 12 .1%. Her breakfast blood glucose range is generally >200 mg/dl. Her lunch blood glucose range is generally >200 mg/dl. Her dinner blood glucose range is generally >200 mg/dl. Her overall blood glucose range is >200 mg/dl. An ACE  inhibitor/angiotensin II receptor blocker is contraindicated.  Hyperlipidemia This is a chronic problem. The current episode started more than 1 year ago. Pertinent negatives include no chest pain, myalgias or shortness of breath. Current antihyperlipidemic treatment includes statins. Risk factors for coronary artery disease include dyslipidemia, diabetes mellitus, hypertension, obesity and a sedentary lifestyle.  Hypertension This is a chronic problem. The current episode started more than 1 year ago. Pertinent negatives include no blurred vision, chest pain, headaches, palpitations or shortness of breath. Past treatments include diuretics. The current treatment provides moderate improvement.     Review of Systems  Constitutional: Positive for fatigue. Negative for unexpected weight change.  HENT: Negative for trouble swallowing and voice change.   Eyes: Negative for blurred vision and visual disturbance.  Respiratory: Negative for cough, shortness of breath and wheezing.   Cardiovascular: Negative for chest pain, palpitations and leg swelling.  Gastrointestinal: Negative for nausea, vomiting and diarrhea.  Endocrine: Positive for polydipsia and polyuria. Negative for cold intolerance, heat intolerance and polyphagia.  Musculoskeletal: Negative for myalgias and arthralgias.  Skin: Negative for color change, pallor, rash and wound.  Neurological: Negative for seizures and headaches.  Psychiatric/Behavioral: Negative for suicidal ideas and confusion.    Objective:    BP 125/85 mmHg  Pulse 81  Ht 5\' 2"  (1.575 m)  Wt 182 lb (82.555 kg)  BMI 33.28 kg/m2  SpO2 99%  LMP 06/13/2015 (Approximate)  Wt Readings from Last 3 Encounters:  07/25/15 182 lb (82.555 kg)  07/14/15 179 lb (81.194 kg)  07/12/15 177 lb (80.287 kg)    Physical Exam  Constitutional: She is oriented to person, place, and time. She appears well-developed.  HENT:  Head: Normocephalic and atraumatic.  Eyes: EOM are  normal.  Neck: Normal range of motion. Neck supple. No tracheal deviation present. No thyromegaly present.  Cardiovascular: Normal rate and regular rhythm.   Pulmonary/Chest: Effort normal and breath sounds normal.  Abdominal: Soft. Bowel sounds are normal. There is no tenderness. There is no guarding.  Musculoskeletal: Normal range of motion. She exhibits no edema.  Neurological: She is alert and oriented to person, place, and time. She has normal reflexes. No cranial nerve deficit. Coordination normal.  Skin: Skin is warm and dry. No rash noted. No erythema. No pallor.  Psychiatric: She has a normal mood and affect. Judgment normal.    Results for orders placed or performed in visit on 07/14/15  Hemoglobin A1c  Result Value Ref Range   Hgb A1c MFr Bld 12.1 (A) 4.0 - 6.0 %   Complete Blood Count (Most recent): Lab Results  Component Value Date   WBC 8.5 07/15/2014   HGB 11.8* 07/15/2014   HCT 36.0 07/15/2014   MCV 80.5 07/15/2014   PLT 231 07/15/2014   Chemistry (most recent): Lab Results  Component Value Date   NA 138 02/22/2015   K 3.4* 02/22/2015   CL 97 02/22/2015   CO2 31 02/22/2015   BUN 10 02/22/2015   CREATININE 0.72 02/22/2015   Diabetic Labs (most recent): Lab Results  Component Value Date   HGBA1C 12.1* 07/10/2015   HGBA1C 8.4*  02/22/2015   HGBA1C 7.2* 10/26/2014   Lipid profile (most recent): Lab Results  Component Value Date   TRIG 88 10/26/2014   CHOL 148 10/26/2014     Assessment & Plan:   1. Diabetes mellitus type 2 in obese [facility])  - patient remains at a high risk for more acute and chronic complications of diabetes which include CAD, CVA, CKD, retinopathy, and neuropathy. These are all discussed in detail with the patient.  Patient came with, consistently above target blood glucose profile and her recent A1c was increased 12.1%,  Recent labs reviewed.   - I have re-counseled the patient on diet management and weight loss  by adopting a  carbohydrate restricted / protein rich  Diet.  - Suggestion is made for patient to avoid simple carbohydrates   from their diet including Cakes , Desserts, Ice Cream,  Soda (  diet and regular) , Sweet Tea , Candies,  Chips, Cookies, Artificial Sweeteners,   and "Sugar-free" Products .  This will help patient to have stable blood glucose profile and potentially avoid unintended  Weight gain.  - Patient is advised to stick to a routine mealtimes to eat 3 meals  a day and avoid unnecessary snacks ( to snack only to correct hypoglycemia).  - The patient  has been  scheduled with Jearld Fenton, RDN, CDE for individualized DM education.  - I have approached patient with the following individualized plan to manage diabetes and patient agrees.  - She will need more insulin, her insurance did not cover  Toujeo, we will switch to Lantus and increased to 30  units QHS, associated with strict monitoring of glucose  AC  breakfast and at bedtime. -If she cannot control with increased basal insulin along with Janumet she will be considered for prandial insulin next visit .  -Patient is encouraged to call clinic for blood glucose levels less than 70 or above 300 mg /dl.  - I will continue Janumet 50/1000mg  po BID, therapeutically suitable for patient.   - Patient specific target  for A1c; LDL, HDL, Triglycerides, and  Waist Circumference were discussed in detail.  2) BP/HTN: Controlled. Continue current medications. 3) Lipids/HPL:  continue statins.  4)  Weight/Diet: CDE consult in progress, exercise, and carbohydrates information provided.  5) Vitamin D deficiency -I  advised her to continue vitamin D 50,000 units weekly for the next 12 weeks.  6) Chronic Care/Health Maintenance:  -Patient is on a Statin medications (allergic to ACE inhibitors) and encouraged to continue to follow up with Ophthalmology, Podiatrist at least yearly or according to recommendations, and advised to  stay away from  smoking. I have recommended yearly flu vaccine and pneumonia vaccination at least every 5 years; moderate intensity exercise for up to 150 minutes weekly; and  sleep for at least 7 hours a day.  - 25 minutes of time was spent on the care of this patient , 50% of which was applied for counseling on diabetes complications and their preventions.  - I advised patient to maintain close follow up with [name], MD for primary care needs.  Patient is asked to bring meter and  blood glucose logs during their next visit.   Follow up plan: -Return in about 10 weeks (around 10/03/2015) for diabetes, high blood pressure, high cholesterol, follow up with pre-visit labs, meter, and logs.  Glade Lloyd, MD Phone: [phone]  Fax: [phone]   07/25/2015, 11:47 AM

## 2015-07-31 ENCOUNTER — Other Ambulatory Visit: Payer: Self-pay | Admitting: "Endocrinology

## 2015-07-31 ENCOUNTER — Other Ambulatory Visit: Payer: Self-pay | Admitting: Family Medicine

## 2015-08-23 ENCOUNTER — Encounter: Payer: Self-pay | Admitting: Family Medicine

## 2015-08-23 ENCOUNTER — Ambulatory Visit (INDEPENDENT_AMBULATORY_CARE_PROVIDER_SITE_OTHER): Payer: BLUE CROSS/BLUE SHIELD | Admitting: Family Medicine

## 2015-08-23 VITALS — BP 118/82 | HR 73 | Resp 16 | Ht 62.0 in | Wt 186.4 lb

## 2015-08-23 DIAGNOSIS — E119 Type 2 diabetes mellitus without complications: Secondary | ICD-10-CM

## 2015-08-23 DIAGNOSIS — I10 Essential (primary) hypertension: Secondary | ICD-10-CM

## 2015-08-23 DIAGNOSIS — E1169 Type 2 diabetes mellitus with other specified complication: Secondary | ICD-10-CM

## 2015-08-23 DIAGNOSIS — M5432 Sciatica, left side: Secondary | ICD-10-CM

## 2015-08-23 DIAGNOSIS — E669 Obesity, unspecified: Secondary | ICD-10-CM

## 2015-08-23 DIAGNOSIS — IMO0001 Reserved for inherently not codable concepts without codable children: Secondary | ICD-10-CM

## 2015-08-23 MED ORDER — IBUPROFEN 800 MG PO TABS: 800.0000 mg | ORAL_TABLET | Freq: Three times a day (TID) | ORAL | Status: DC

## 2015-08-23 MED ORDER — RANITIDINE HCL 150 MG PO CAPS: 150.0000 mg | ORAL_CAPSULE | Freq: Two times a day (BID) | ORAL | Status: DC

## 2015-08-23 MED ORDER — HYDROCODONE-ACETAMINOPHEN 5-325 MG PO TABS: ORAL_TABLET | ORAL | Status: DC

## 2015-08-23 MED ORDER — PREDNISONE 5 MG (21) PO TBPK: 5.0000 mg | ORAL_TABLET | ORAL | Status: DC

## 2015-08-23 MED ORDER — KETOROLAC TROMETHAMINE 60 MG/2ML IM SOLN
60.0000 mg | Freq: Once | INTRAMUSCULAR | Status: AC
Start: 2015-08-23 — End: 2015-08-23
  Administered 2015-08-23: 60 mg via INTRAMUSCULAR

## 2015-08-23 MED ORDER — METHYLPREDNISOLONE ACETATE 80 MG/ML IJ SUSP
80.0000 mg | Freq: Once | INTRAMUSCULAR | Status: AC
  Administered 2015-08-23: 80 mg via INTRAMUSCULAR

## 2015-08-23 NOTE — Patient Instructions (Signed)
F/u as before, call if you need me sooner  You are treated for acute back pain with left sided sciatica  Injections in office and medications sent in Take ibuprofen as directed for 1 week, then the additional 9 tablets, as needed Take prednisone and ranitidine as directed  Take hydrocodone , maximum of twice daily for initial 3 to 5 days , themn only at bedtime if needed.  I hope this r causes pain to go away completely

## 2015-08-23 NOTE — Assessment & Plan Note (Signed)
Controlled, no change in medication DASH diet and commitment to daily physical activity for a minimum of 30 minutes discussed and encouraged, as a part of hypertension management. The importance of attaining a healthy weight is also discussed.  BP/Weight 08/23/2015 07/25/2015 07/14/2015 07/12/2015 05/17/2015 04/21/2015 123456  Systolic BP 123456 0000000 [id number] [id number] - [id number]  Diastolic BP 82 85 80 82 80 - 80  Wt. (Lbs) 186.4 182 179 177 187 190 190.04  BMI 34.08 33.28 32.73 32.37 34.19 34.74 34.75

## 2015-08-23 NOTE — Progress Notes (Signed)
   Subjective:    Patient ID: [name], female    DOB: [birth year], 40 y.o.   MRN: [id number]  HPI 1 week h/o acute back pain radiating down left leg to all toes, rated at a 10, keeps her awake, no aggravating factor noted, denies loss of strength or sensation. Denies incontinence Denies polyuria, polydipsia, blurred vision , or hypoglycemic episodes. states fasting sugars are improved and average around 105   Review of Systems See HPI Denies recent fever or chills. Denies sinus pressure, nasal congestion, ear pain or sore throat. Denies chest congestion, productive cough or wheezing. Denies chest pains, palpitations and leg swelling Denies abdominal pain, nausea, vomiting,diarrhea or constipation.   Denies dysuria, frequency, hesitancy or incontinence. . Denies depression, anxiety or insomnia. Denies skin break down or rash.        Objective:   Physical Exam BP 118/82 mmHg  Pulse 73  Resp 16  Ht 5\' 2"  (1.575 m)  Wt 186 lb 6.4 oz (84.55 kg)  BMI 34.08 kg/m2  SpO2 99% Patient alert and oriented and in no cardiopulmonary distress.  HEENT: No facial asymmetry, EOMI,   oropharynx pink and moist.  Neck supple no JVD, no mass.  Chest: Clear to auscultation bilaterally.  CVS: S1, S2 no murmurs, no S3.Regular rate.  ABD: Soft non tender.   Ext: No edema  MS: decreased ROM lumbar spine, normal power  And sensation in both lower extremities  Skin: Intact, no ulcerations or rash noted.  Psych: Good eye contact, normal affect. Memory intact not anxious or depressed appearing.  CNS: CN 2-12 intact, power,  normal throughout.no focal deficits noted.        Assessment & Plan:  Back pain with left-sided sciatica Uncontrolled.Toradol and depo medrol administered IM in the office , to be followed by a short course of oral prednisone and NSAIDS. 1 week flare  Essential hypertension Controlled, no change in medication DASH diet and commitment to daily physical  activity for a minimum of 30 minutes discussed and encouraged, as a part of hypertension management. The importance of attaining a healthy weight is also discussed.  BP/Weight 08/23/2015 07/25/2015 07/14/2015 07/12/2015 05/17/2015 04/21/2015 [id number]  Systolic BP 123456 [id number]  Diastolic BP 82 85 80 82 80 - 80  Wt. (Lbs) 186.4 182 179 177 187 190 190.04  BMI 34.08 33.28 32.73 32.37 34.19 34.74 34.75        Diabetes mellitus type 2 in obese Followed by endo, reports improvement with fasting blood sugars averaging 105 Encouraged her to stick with management plan and keepp follow up as she generally does  Obesity, Class II, BMI 35-39.9, with comorbidity Deteriorated. Patient re-educated about  the importance of commitment to a  minimum of 150 minutes of exercise per week.  The importance of healthy food choices with portion control discussed. Encouraged to start a food diary, count calories and to consider  joining a support group. Sample diet sheets offered. Goals set by the patient for the next several months.   Weight /BMI 08/23/2015 07/25/2015 07/14/2015  WEIGHT 186 lb 6.4 oz 182 lb 179 lb  HEIGHT 5\' 2"  5\' 2"  5\' 2"   BMI 34.08 kg/m2 33.28 kg/m2 32.73 kg/m2    Current exercise per week 90 mins, however none in past 1 week sdue to acute sciatica

## 2015-08-23 NOTE — Assessment & Plan Note (Signed)
Deteriorated. Patient re-educated about  the importance of commitment to a  minimum of 150 minutes of exercise per week.  The importance of healthy food choices with portion control discussed. Encouraged to start a food diary, count calories and to consider  joining a support group. Sample diet sheets offered. Goals set by the patient for the next several months.   Weight /BMI 08/23/2015 07/25/2015 07/14/2015  WEIGHT 186 lb 6.4 oz 182 lb 179 lb  HEIGHT 5\' 2"  5\' 2"  5\' 2"   BMI 34.08 kg/m2 33.28 kg/m2 32.73 kg/m2    Current exercise per week 90 mins, however none in past 1 week sdue to acute sciatica

## 2015-08-23 NOTE — Assessment & Plan Note (Signed)
Uncontrolled.Toradol and depo medrol administered IM in the office , to be followed by a short course of oral prednisone and NSAIDS. 1 week flare

## 2015-08-23 NOTE — Assessment & Plan Note (Signed)
Followed by endo, reports improvement with fasting blood sugars averaging 105 Encouraged her to stick with management plan and keepp follow up as she generally does

## 2015-08-28 ENCOUNTER — Other Ambulatory Visit: Payer: Self-pay | Admitting: Family Medicine

## 2015-08-28 ENCOUNTER — Encounter: Payer: Self-pay | Admitting: Family Medicine

## 2015-08-28 ENCOUNTER — Telehealth: Payer: Self-pay | Admitting: Family Medicine

## 2015-08-28 DIAGNOSIS — M549 Dorsalgia, unspecified: Secondary | ICD-10-CM

## 2015-08-28 MED ORDER — HYDROCODONE-ACETAMINOPHEN 7.5-325 MG PO TABS: 1.0000 | ORAL_TABLET | Freq: Three times a day (TID) | ORAL | Status: DC | PRN

## 2015-08-28 NOTE — Telephone Encounter (Signed)
Pls call and let pt know I am sorry she is in so much pain mRI is ordered and will take it from there. If she needs 3 hydrocodone daily and at higher dose , in the interim, was prescribed one daily, 5 days ago, oK to write hydrocodone 7.5/325 one 3 times daily #30 pls [print I will sign Ask if she needs sedative for mRI I will send that in. Let her know this MRI has contrast since she has ahd back surgery, no allergy documented to contrast but just make her aware pls

## 2015-08-28 NOTE — Telephone Encounter (Signed)
Ativan entered pls print , send and let her know, thanks

## 2015-08-28 NOTE — Telephone Encounter (Signed)
Patient would like a sedative called in as well for MRI

## 2015-08-28 NOTE — Addendum Note (Signed)
Addended by: Eual Fines on: 08/28/2015 01:45 PM   Modules accepted: Orders, Medications

## 2015-08-29 ENCOUNTER — Ambulatory Visit (HOSPITAL_COMMUNITY)
Admission: RE | Admit: 2015-08-29 | Discharge: 2015-08-29 | Disposition: A | Discharge status: Home / Self Care | Payer: BLUE CROSS/BLUE SHIELD | Source: Ambulatory Visit | Attending: Family Medicine | Admitting: Family Medicine

## 2015-08-29 ENCOUNTER — Other Ambulatory Visit: Payer: Self-pay | Admitting: Family Medicine

## 2015-08-29 ENCOUNTER — Other Ambulatory Visit: Payer: Self-pay

## 2015-08-29 DIAGNOSIS — M549 Dorsalgia, unspecified: Secondary | ICD-10-CM | POA: Insufficient documentation

## 2015-08-29 DIAGNOSIS — M533 Sacrococcygeal disorders, not elsewhere classified: Secondary | ICD-10-CM | POA: Diagnosis not present

## 2015-08-29 DIAGNOSIS — Z9889 Other specified postprocedural states: Secondary | ICD-10-CM | POA: Insufficient documentation

## 2015-08-29 LAB — POCT I-STAT CREATININE: CREATININE: 1 mg/dL (ref 0.44–1.00)

## 2015-08-29 MED ORDER — GADOBENATE DIMEGLUMINE 529 MG/ML IV SOLN
17.0000 mL | Freq: Once | INTRAVENOUS | Status: AC | PRN
  Administered 2015-08-29: 17 mL via INTRAVENOUS

## 2015-08-29 MED ORDER — SODIUM CHLORIDE 0.9 % IJ SOLN
INTRAMUSCULAR | Status: AC
  Filled 2015-08-29: qty 250

## 2015-08-29 MED ORDER — LORAZEPAM 2 MG PO TABS: ORAL_TABLET | ORAL | Status: DC

## 2015-08-29 NOTE — Telephone Encounter (Signed)
Will fax and pt aware

## 2015-08-30 ENCOUNTER — Other Ambulatory Visit: Payer: Self-pay | Admitting: Family Medicine

## 2015-08-30 DIAGNOSIS — M5116 Intervertebral disc disorders with radiculopathy, lumbar region: Secondary | ICD-10-CM

## 2015-08-31 ENCOUNTER — Encounter: Payer: Self-pay | Admitting: Family Medicine

## 2015-09-01 ENCOUNTER — Encounter: Payer: Self-pay | Admitting: Family Medicine

## 2015-09-08 ENCOUNTER — Other Ambulatory Visit (HOSPITAL_COMMUNITY): Payer: BLUE CROSS/BLUE SHIELD

## 2015-09-12 ENCOUNTER — Other Ambulatory Visit: Payer: Self-pay | Admitting: Neurosurgery

## 2015-09-12 ENCOUNTER — Encounter (HOSPITAL_COMMUNITY): Payer: Self-pay | Admitting: *Deleted

## 2015-09-13 ENCOUNTER — Encounter (HOSPITAL_COMMUNITY)
Admission: RE | Disposition: A | Discharge status: Home / Self Care | Payer: Self-pay | Source: Ambulatory Visit | Attending: Neurosurgery

## 2015-09-13 ENCOUNTER — Ambulatory Visit (HOSPITAL_COMMUNITY): Payer: BLUE CROSS/BLUE SHIELD

## 2015-09-13 ENCOUNTER — Encounter (HOSPITAL_COMMUNITY): Payer: Self-pay | Admitting: *Deleted

## 2015-09-13 ENCOUNTER — Ambulatory Visit (HOSPITAL_COMMUNITY)
Admission: RE | Admit: 2015-09-13 | Discharge: 2015-09-14 | Disposition: A | Discharge status: Home / Self Care | Payer: BLUE CROSS/BLUE SHIELD | Source: Ambulatory Visit | Attending: Neurosurgery | Admitting: Neurosurgery

## 2015-09-13 ENCOUNTER — Ambulatory Visit (HOSPITAL_COMMUNITY): Payer: BLUE CROSS/BLUE SHIELD | Admitting: Anesthesiology

## 2015-09-13 DIAGNOSIS — Z888 Allergy status to other drugs, medicaments and biological substances status: Secondary | ICD-10-CM | POA: Diagnosis not present

## 2015-09-13 DIAGNOSIS — Z794 Long term (current) use of insulin: Secondary | ICD-10-CM | POA: Diagnosis not present

## 2015-09-13 DIAGNOSIS — E119 Type 2 diabetes mellitus without complications: Secondary | ICD-10-CM | POA: Insufficient documentation

## 2015-09-13 DIAGNOSIS — I10 Essential (primary) hypertension: Secondary | ICD-10-CM | POA: Insufficient documentation

## 2015-09-13 DIAGNOSIS — Z7984 Long term (current) use of oral hypoglycemic drugs: Secondary | ICD-10-CM | POA: Diagnosis not present

## 2015-09-13 DIAGNOSIS — M5126 Other intervertebral disc displacement, lumbar region: Secondary | ICD-10-CM | POA: Diagnosis present

## 2015-09-13 DIAGNOSIS — M5116 Intervertebral disc disorders with radiculopathy, lumbar region: Secondary | ICD-10-CM | POA: Insufficient documentation

## 2015-09-13 DIAGNOSIS — Z419 Encounter for procedure for purposes other than remedying health state, unspecified: Secondary | ICD-10-CM

## 2015-09-13 HISTORY — PX: LUMBAR LAMINECTOMY/DECOMPRESSION MICRODISCECTOMY: SHX5026

## 2015-09-13 LAB — BASIC METABOLIC PANEL
ANION GAP: 10 (ref 5–15)
BUN: 10 mg/dL (ref 6–20)
CO2: 28 mmol/L (ref 22–32)
Calcium: 9.1 mg/dL (ref 8.9–10.3)
Chloride: 101 mmol/L (ref 101–111)
Creatinine, Ser: 0.88 mg/dL (ref 0.44–1.00)
GFR calc Af Amer: >60 mL/min (ref >60)
Glucose, Bld: 119 mg/dL — ABNORMAL HIGH (ref 65–99)
POTASSIUM: 3.5 mmol/L (ref 3.5–5.1)
SODIUM: 139 mmol/L (ref 135–145)

## 2015-09-13 LAB — CBC
HEMATOCRIT: 40.5 % (ref 36.0–46.0)
HEMOGLOBIN: 13.3 g/dL (ref 12.0–15.0)
MCH: 26.7 pg (ref 26.0–34.0)
MCHC: 32.8 g/dL (ref 30.0–36.0)
MCV: 81.2 fL (ref 78.0–100.0)
Platelets: 209 10*3/uL (ref 150–400)
RBC: 4.99 MIL/uL (ref 3.87–5.11)
RDW: 13.5 % (ref 11.5–15.5)
WBC: 10.3 10*3/uL (ref 4.0–10.5)

## 2015-09-13 LAB — HCG, SERUM, QUALITATIVE: PREG SERUM: NEGATIVE

## 2015-09-13 LAB — SURGICAL PCR SCREEN
MRSA, PCR: NEGATIVE
STAPHYLOCOCCUS AUREUS: NEGATIVE

## 2015-09-13 LAB — GLUCOSE, CAPILLARY
GLUCOSE-CAPILLARY: 121 mg/dL — AB (ref 65–99)
GLUCOSE-CAPILLARY: 84 mg/dL (ref 65–99)
GLUCOSE-CAPILLARY: 96 mg/dL (ref 65–99)
Glucose-Capillary: 94 mg/dL (ref 65–99)

## 2015-09-13 SURGERY — LUMBAR LAMINECTOMY/DECOMPRESSION MICRODISCECTOMY 1 LEVEL
Anesthesia: General | Site: Back | Laterality: Left

## 2015-09-13 MED ORDER — ONDANSETRON HCL 4 MG/2ML IJ SOLN: 4.0000 mg | INTRAMUSCULAR | Status: DC | PRN

## 2015-09-13 MED ORDER — METFORMIN HCL 500 MG PO TABS
1000.0000 mg | ORAL_TABLET | Freq: Two times a day (BID) | ORAL | Status: DC
  Administered 2015-09-14 (×2): 1000 mg via ORAL
  Filled 2015-09-13 (×2): qty 2

## 2015-09-13 MED ORDER — ROCURONIUM BROMIDE 50 MG/5ML IV SOLN
INTRAVENOUS | Status: AC
  Filled 2015-09-13: qty 1

## 2015-09-13 MED ORDER — HYDROCODONE-ACETAMINOPHEN 5-325 MG PO TABS: 1.0000 | ORAL_TABLET | ORAL | Status: DC | PRN

## 2015-09-13 MED ORDER — HYDROCHLOROTHIAZIDE 25 MG PO TABS
25.0000 mg | ORAL_TABLET | Freq: Every day | ORAL | Status: DC
  Administered 2015-09-14: 25 mg via ORAL
  Filled 2015-09-13: qty 1

## 2015-09-13 MED ORDER — ONDANSETRON HCL 4 MG/2ML IJ SOLN
INTRAMUSCULAR | Status: AC
  Filled 2015-09-13: qty 2

## 2015-09-13 MED ORDER — ROCURONIUM BROMIDE 100 MG/10ML IV SOLN
INTRAVENOUS | Status: DC | PRN
  Administered 2015-09-13: 50 mg via INTRAVENOUS

## 2015-09-13 MED ORDER — DEXAMETHASONE SODIUM PHOSPHATE 4 MG/ML IJ SOLN
INTRAMUSCULAR | Status: DC | PRN
  Administered 2015-09-13: 4 mg via INTRAVENOUS

## 2015-09-13 MED ORDER — VITAMIN D (ERGOCALCIFEROL) 1.25 MG (50000 UNIT) PO CAPS: 50000.0000 [IU] | ORAL_CAPSULE | ORAL | Status: DC

## 2015-09-13 MED ORDER — FENTANYL CITRATE (PF) 250 MCG/5ML IJ SOLN
INTRAMUSCULAR | Status: AC
  Filled 2015-09-13: qty 5

## 2015-09-13 MED ORDER — MUPIROCIN 2 % EX OINT
TOPICAL_OINTMENT | CUTANEOUS | Status: AC
  Filled 2015-09-13: qty 22

## 2015-09-13 MED ORDER — DIAZEPAM 5 MG PO TABS
ORAL_TABLET | ORAL | Status: AC
  Administered 2015-09-13: 5 mg via ORAL
  Filled 2015-09-13: qty 1

## 2015-09-13 MED ORDER — ACETAMINOPHEN 325 MG PO TABS: 650.0000 mg | ORAL_TABLET | ORAL | Status: DC | PRN

## 2015-09-13 MED ORDER — CEFAZOLIN SODIUM-DEXTROSE 2-3 GM-% IV SOLR
INTRAVENOUS | Status: AC
  Administered 2015-09-13: 2 g via INTRAVENOUS
  Filled 2015-09-13: qty 50

## 2015-09-13 MED ORDER — 0.9 % SODIUM CHLORIDE (POUR BTL) OPTIME
TOPICAL | Status: DC | PRN
  Administered 2015-09-13: 1000 mL

## 2015-09-13 MED ORDER — DIAZEPAM 5 MG PO TABS
5.0000 mg | ORAL_TABLET | Freq: Four times a day (QID) | ORAL | Status: DC | PRN
  Administered 2015-09-13: 5 mg via ORAL

## 2015-09-13 MED ORDER — HYDROMORPHONE HCL 1 MG/ML IJ SOLN
0.2500 mg | INTRAMUSCULAR | Status: DC | PRN
  Administered 2015-09-13 (×2): 0.5 mg via INTRAVENOUS

## 2015-09-13 MED ORDER — MIDAZOLAM HCL 2 MG/2ML IJ SOLN
INTRAMUSCULAR | Status: AC
Start: 2015-09-13 — End: 2015-09-13
  Filled 2015-09-13: qty 2

## 2015-09-13 MED ORDER — OXYCODONE-ACETAMINOPHEN 5-325 MG PO TABS
1.0000 | ORAL_TABLET | ORAL | Status: DC | PRN
  Administered 2015-09-14 (×2): 2 via ORAL
  Filled 2015-09-13 (×2): qty 2

## 2015-09-13 MED ORDER — SODIUM CHLORIDE 0.9% FLUSH
3.0000 mL | Freq: Two times a day (BID) | INTRAVENOUS | Status: DC
  Administered 2015-09-14 (×2): 3 mL via INTRAVENOUS

## 2015-09-13 MED ORDER — MIDAZOLAM HCL 5 MG/5ML IJ SOLN
INTRAMUSCULAR | Status: DC | PRN
  Administered 2015-09-13: 2 mg via INTRAVENOUS

## 2015-09-13 MED ORDER — GLYCOPYRROLATE 0.2 MG/ML IJ SOLN
INTRAMUSCULAR | Status: AC
  Filled 2015-09-13: qty 9

## 2015-09-13 MED ORDER — INSULIN PEN NEEDLE 31G X 5 MM MISC: 1.0000 | Freq: Every day | Status: DC

## 2015-09-13 MED ORDER — PANTOPRAZOLE SODIUM 40 MG PO TBEC
40.0000 mg | DELAYED_RELEASE_TABLET | Freq: Every day | ORAL | Status: DC
  Administered 2015-09-14: 40 mg via ORAL
  Filled 2015-09-13: qty 1

## 2015-09-13 MED ORDER — HYDROMORPHONE HCL 1 MG/ML IJ SOLN
0.5000 mg | INTRAMUSCULAR | Status: DC | PRN
  Administered 2015-09-14 (×3): 1 mg via INTRAVENOUS
  Filled 2015-09-13 (×3): qty 1

## 2015-09-13 MED ORDER — THROMBIN 5000 UNITS EX SOLR
CUTANEOUS | Status: DC | PRN
  Administered 2015-09-13 (×2): 5000 [IU] via TOPICAL

## 2015-09-13 MED ORDER — PROPRANOLOL HCL 40 MG PO TABS
40.0000 mg | ORAL_TABLET | Freq: Once | ORAL | Status: AC
  Administered 2015-09-13: 40 mg via ORAL
  Filled 2015-09-13 (×2): qty 1

## 2015-09-13 MED ORDER — LIDOCAINE HCL (CARDIAC) 20 MG/ML IV SOLN
INTRAVENOUS | Status: AC
  Filled 2015-09-13: qty 5

## 2015-09-13 MED ORDER — SUCCINYLCHOLINE CHLORIDE 20 MG/ML IJ SOLN
INTRAMUSCULAR | Status: AC
  Filled 2015-09-13: qty 1

## 2015-09-13 MED ORDER — KETOROLAC TROMETHAMINE 30 MG/ML IJ SOLN
30.0000 mg | Freq: Four times a day (QID) | INTRAMUSCULAR | Status: DC
  Administered 2015-09-14 (×3): 30 mg via INTRAVENOUS
  Filled 2015-09-13 (×3): qty 1

## 2015-09-13 MED ORDER — ACETAMINOPHEN 650 MG RE SUPP: 650.0000 mg | RECTAL | Status: DC | PRN

## 2015-09-13 MED ORDER — NEOSTIGMINE METHYLSULFATE 10 MG/10ML IV SOLN
INTRAVENOUS | Status: AC
  Filled 2015-09-13: qty 2

## 2015-09-13 MED ORDER — MUPIROCIN 2 % EX OINT
1.0000 "application " | TOPICAL_OINTMENT | Freq: Once | CUTANEOUS | Status: AC
  Administered 2015-09-13: 1 via TOPICAL

## 2015-09-13 MED ORDER — NEOSTIGMINE METHYLSULFATE 10 MG/10ML IV SOLN
INTRAVENOUS | Status: DC | PRN
  Administered 2015-09-13: 4 mg via INTRAVENOUS

## 2015-09-13 MED ORDER — LINAGLIPTIN 5 MG PO TABS
5.0000 mg | ORAL_TABLET | Freq: Every day | ORAL | Status: DC
  Administered 2015-09-14: 5 mg via ORAL
  Filled 2015-09-13: qty 1

## 2015-09-13 MED ORDER — FAMOTIDINE 20 MG PO TABS
20.0000 mg | ORAL_TABLET | Freq: Every day | ORAL | Status: DC
  Administered 2015-09-14: 20 mg via ORAL
  Filled 2015-09-13: qty 1

## 2015-09-13 MED ORDER — SITAGLIPTIN PHOS-METFORMIN HCL 50-1000 MG PO TABS: 1.0000 | ORAL_TABLET | Freq: Two times a day (BID) | ORAL | Status: DC

## 2015-09-13 MED ORDER — LIDOCAINE HCL (CARDIAC) 20 MG/ML IV SOLN
INTRAVENOUS | Status: DC | PRN
  Administered 2015-09-13: 60 mg via INTRAVENOUS

## 2015-09-13 MED ORDER — ETOMIDATE 2 MG/ML IV SOLN
INTRAVENOUS | Status: AC
Start: 2015-09-13 — End: 2015-09-13
  Filled 2015-09-13: qty 10

## 2015-09-13 MED ORDER — PROPOFOL 10 MG/ML IV BOLUS
INTRAVENOUS | Status: DC | PRN
  Administered 2015-09-13: 150 mg via INTRAVENOUS

## 2015-09-13 MED ORDER — INSULIN GLARGINE 100 UNIT/ML ~~LOC~~ SOLN
30.0000 [IU] | Freq: Every day | SUBCUTANEOUS | Status: DC
  Administered 2015-09-14: 30 [IU] via SUBCUTANEOUS
  Filled 2015-09-13 (×2): qty 0.3

## 2015-09-13 MED ORDER — PROPRANOLOL HCL 40 MG PO TABS
40.0000 mg | ORAL_TABLET | Freq: Two times a day (BID) | ORAL | Status: DC
  Administered 2015-09-14 (×2): 40 mg via ORAL
  Filled 2015-09-13 (×3): qty 1

## 2015-09-13 MED ORDER — PHENOL 1.4 % MT LIQD: 1.0000 | OROMUCOSAL | Status: DC | PRN

## 2015-09-13 MED ORDER — ARTIFICIAL TEARS OP OINT
TOPICAL_OINTMENT | OPHTHALMIC | Status: DC | PRN
  Administered 2015-09-13: 1 via OPHTHALMIC

## 2015-09-13 MED ORDER — MENTHOL 3 MG MT LOZG: 1.0000 | LOZENGE | OROMUCOSAL | Status: DC | PRN

## 2015-09-13 MED ORDER — GLYCOPYRROLATE 0.2 MG/ML IJ SOLN
INTRAMUSCULAR | Status: DC | PRN
  Administered 2015-09-13: .7 mg via INTRAVENOUS

## 2015-09-13 MED ORDER — ONDANSETRON HCL 4 MG/2ML IJ SOLN
INTRAMUSCULAR | Status: DC | PRN
  Administered 2015-09-13: 4 mg via INTRAVENOUS

## 2015-09-13 MED ORDER — SODIUM CHLORIDE 0.9 % IV SOLN: 250.0000 mL | INTRAVENOUS | Status: DC

## 2015-09-13 MED ORDER — FENTANYL CITRATE (PF) 100 MCG/2ML IJ SOLN
INTRAMUSCULAR | Status: DC | PRN
  Administered 2015-09-13 (×2): 50 ug via INTRAVENOUS
  Administered 2015-09-13: 150 ug via INTRAVENOUS
  Administered 2015-09-13: 50 ug via INTRAVENOUS

## 2015-09-13 MED ORDER — HYDROMORPHONE HCL 1 MG/ML IJ SOLN
INTRAMUSCULAR | Status: AC
  Filled 2015-09-13: qty 1

## 2015-09-13 MED ORDER — PROPOFOL 10 MG/ML IV BOLUS
INTRAVENOUS | Status: AC
  Filled 2015-09-13: qty 20

## 2015-09-13 MED ORDER — DEXAMETHASONE SODIUM PHOSPHATE 4 MG/ML IJ SOLN
INTRAMUSCULAR | Status: AC
  Filled 2015-09-13: qty 1

## 2015-09-13 MED ORDER — HEMOSTATIC AGENTS (NO CHARGE) OPTIME
TOPICAL | Status: DC | PRN
  Administered 2015-09-13: 1 via TOPICAL

## 2015-09-13 MED ORDER — LACTATED RINGERS IV SOLN
INTRAVENOUS | Status: DC
  Administered 2015-09-13: 14:00:00 via INTRAVENOUS

## 2015-09-13 MED ORDER — HYDROMORPHONE HCL 1 MG/ML IJ SOLN
INTRAMUSCULAR | Status: AC
  Administered 2015-09-13: 0.5 mg via INTRAVENOUS
  Filled 2015-09-13: qty 1

## 2015-09-13 MED ORDER — SODIUM CHLORIDE 0.9% FLUSH: 3.0000 mL | INTRAVENOUS | Status: DC | PRN

## 2015-09-13 MED ORDER — ZOLPIDEM TARTRATE 5 MG PO TABS: 5.0000 mg | ORAL_TABLET | Freq: Every evening | ORAL | Status: DC | PRN

## 2015-09-13 MED ORDER — GLYCOPYRROLATE 0.2 MG/ML IJ SOLN
INTRAMUSCULAR | Status: AC
  Filled 2015-09-13: qty 3

## 2015-09-13 MED ORDER — PROMETHAZINE HCL 25 MG/ML IJ SOLN: 6.2500 mg | INTRAMUSCULAR | Status: DC | PRN

## 2015-09-13 MED ORDER — POTASSIUM CHLORIDE IN NACL 20-0.9 MEQ/L-% IV SOLN
INTRAVENOUS | Status: DC
  Administered 2015-09-14: 01:00:00 via INTRAVENOUS
  Filled 2015-09-13: qty 1000

## 2015-09-13 MED ORDER — LACTATED RINGERS IV SOLN
INTRAVENOUS | Status: DC | PRN
  Administered 2015-09-13 (×2): via INTRAVENOUS

## 2015-09-13 MED ORDER — PRAVASTATIN SODIUM 20 MG PO TABS
10.0000 mg | ORAL_TABLET | Freq: Every day | ORAL | Status: DC
  Administered 2015-09-14: 10 mg via ORAL
  Filled 2015-09-13: qty 1

## 2015-09-13 SURGICAL SUPPLY — 59 items
BAG DECANTER FOR FLEXI CONT (MISCELLANEOUS) IMPLANT
BENZOIN TINCTURE PRP APPL 2/3 (GAUZE/BANDAGES/DRESSINGS) IMPLANT
BLADE CLIPPER SURG (BLADE) IMPLANT
BLADE SURG 11 STRL SS (BLADE) ×2 IMPLANT
BUR MATCHSTICK NEURO 3.0 LAGG (BURR) ×2 IMPLANT
CANISTER SUCT 3000ML PPV (MISCELLANEOUS) ×2 IMPLANT
DECANTER SPIKE VIAL GLASS SM (MISCELLANEOUS) ×2 IMPLANT
DRAPE LAPAROTOMY 100X72X124 (DRAPES) ×2 IMPLANT
DRAPE MICROSCOPE LEICA (MISCELLANEOUS) ×2 IMPLANT
DRAPE POUCH INSTRU U-SHP 10X18 (DRAPES) ×2 IMPLANT
DRAPE SURG 17X23 STRL (DRAPES) ×2 IMPLANT
DRSG OPSITE POSTOP 4X6 (GAUZE/BANDAGES/DRESSINGS) ×2 IMPLANT
DURAMATRIX ONLAY 2X2 (Neuro Prosthesis/Implant) ×2 IMPLANT
DURAPREP 26ML APPLICATOR (WOUND CARE) ×2 IMPLANT
DURASEAL APPLICATOR TIP (TIP) ×2 IMPLANT
DURASEAL SPINE SEALANT 3ML (MISCELLANEOUS) ×2 IMPLANT
ELECT REM PT RETURN 9FT ADLT (ELECTROSURGICAL) ×2
ELECTRODE REM PT RTRN 9FT ADLT (ELECTROSURGICAL) ×1 IMPLANT
GAUZE SPONGE 4X4 12PLY STRL (GAUZE/BANDAGES/DRESSINGS) IMPLANT
GAUZE SPONGE 4X4 16PLY XRAY LF (GAUZE/BANDAGES/DRESSINGS) IMPLANT
GLOVE BIO SURGEON STRL SZ 6.5 (GLOVE) ×4 IMPLANT
GLOVE BIO SURGEON STRL SZ7 (GLOVE) ×2 IMPLANT
GLOVE BIO SURGEON STRL SZ8 (GLOVE) ×2 IMPLANT
GLOVE BIOGEL PI IND STRL 7.0 (GLOVE) ×3 IMPLANT
GLOVE BIOGEL PI IND STRL 8.5 (GLOVE) ×1 IMPLANT
GLOVE BIOGEL PI INDICATOR 7.0 (GLOVE) ×3
GLOVE BIOGEL PI INDICATOR 8.5 (GLOVE) ×1
GLOVE ECLIPSE 6.5 STRL STRAW (GLOVE) ×6 IMPLANT
GLOVE EXAM NITRILE LRG STRL (GLOVE) IMPLANT
GLOVE EXAM NITRILE MD LF STRL (GLOVE) IMPLANT
GLOVE EXAM NITRILE XL STR (GLOVE) IMPLANT
GLOVE EXAM NITRILE XS STR PU (GLOVE) IMPLANT
GLOVE INDICATOR 7.5 STRL GRN (GLOVE) ×2 IMPLANT
GLOVE SS BIOGEL STRL SZ 7 (GLOVE) ×1 IMPLANT
GLOVE SUPERSENSE BIOGEL SZ 7 (GLOVE) ×1
GOWN STRL REUS W/ TWL LRG LVL3 (GOWN DISPOSABLE) ×5 IMPLANT
GOWN STRL REUS W/ TWL XL LVL3 (GOWN DISPOSABLE) ×1 IMPLANT
GOWN STRL REUS W/TWL 2XL LVL3 (GOWN DISPOSABLE) IMPLANT
GOWN STRL REUS W/TWL LRG LVL3 (GOWN DISPOSABLE) ×5
GOWN STRL REUS W/TWL XL LVL3 (GOWN DISPOSABLE) ×1
KIT BASIN OR (CUSTOM PROCEDURE TRAY) ×2 IMPLANT
KIT ROOM TURNOVER OR (KITS) ×2 IMPLANT
LIQUID BAND (GAUZE/BANDAGES/DRESSINGS) ×2 IMPLANT
NEEDLE HYPO 25X1 1.5 SAFETY (NEEDLE) ×2 IMPLANT
NEEDLE SPNL 18GX3.5 QUINCKE PK (NEEDLE) ×2 IMPLANT
NS IRRIG 1000ML POUR BTL (IV SOLUTION) ×2 IMPLANT
PACK LAMINECTOMY NEURO (CUSTOM PROCEDURE TRAY) ×2 IMPLANT
PAD ARMBOARD 7.5X6 YLW CONV (MISCELLANEOUS) ×6 IMPLANT
RUBBERBAND STERILE (MISCELLANEOUS) ×4 IMPLANT
SPONGE LAP 4X18 X RAY DECT (DISPOSABLE) IMPLANT
SPONGE SURGIFOAM ABS GEL SZ50 (HEMOSTASIS) ×2 IMPLANT
STRIP CLOSURE SKIN 1/2X4 (GAUZE/BANDAGES/DRESSINGS) IMPLANT
SUT VIC AB 0 CT1 18XCR BRD8 (SUTURE) ×1 IMPLANT
SUT VIC AB 0 CT1 8-18 (SUTURE) ×1
SUT VIC AB 2-0 CT1 18 (SUTURE) ×2 IMPLANT
SUT VIC AB 3-0 SH 8-18 (SUTURE) ×2 IMPLANT
TOWEL OR 17X24 6PK STRL BLUE (TOWEL DISPOSABLE) ×2 IMPLANT
TOWEL OR 17X26 10 PK STRL BLUE (TOWEL DISPOSABLE) ×2 IMPLANT
WATER STERILE IRR 1000ML POUR (IV SOLUTION) ×2 IMPLANT

## 2015-09-13 NOTE — Anesthesia Postprocedure Evaluation (Signed)
Anesthesia Post Note  Patient: [name]  Procedure(s) Performed: Procedure(s) (LRB): Left Lumbar five-Sacral one microdiskectomy (Left)  Patient location during evaluation: PACU Anesthesia Type: General Level of consciousness: awake and alert Pain management: pain level controlled Vital Signs Assessment: post-procedure vital signs reviewed and stable Respiratory status: spontaneous breathing, nonlabored ventilation, respiratory function stable and patient connected to nasal cannula oxygen Cardiovascular status: blood pressure returned to baseline and stable Postop Assessment: no signs of nausea or vomiting Anesthetic complications: no    Last Vitals:  Filed Vitals:   09/13/15 2048 09/13/15 2100  BP: 125/70   Pulse: 104 82  Temp: 36.1 C   Resp: 20 16    Last Pain:  Filed Vitals:   09/13/15 2102  PainSc: 5                  Rodney Wigger S

## 2015-09-13 NOTE — Anesthesia Procedure Notes (Signed)
Procedure Name: Intubation Date/Time: 09/13/2015 6:35 PM Performed by: Trixie Deis A Pre-anesthesia Checklist: Patient identified, Timeout performed, Emergency Drugs available, Suction available and Patient being monitored Patient Re-evaluated:Patient Re-evaluated prior to inductionOxygen Delivery Method: Circle system utilized Preoxygenation: Pre-oxygenation with 100% oxygen Intubation Type: IV induction Ventilation: Mask ventilation without difficulty Laryngoscope Size: Mac and 3 Grade View: Grade II Tube type: Oral Tube size: 7.0 mm Number of attempts: 2 Airway Equipment and Method: Stylet Placement Confirmation: ETT inserted through vocal cords under direct vision,  breath sounds checked- equal and bilateral and positive ETCO2 Secured at: 21 cm Tube secured with: Tape Dental Injury: Teeth and Oropharynx as per pre-operative assessment

## 2015-09-13 NOTE — Op Note (Signed)
09/13/2015  9:18 PM  PATIENT:  [name]  40 y.o. female  PRE-OPERATIVE DIAGNOSIS:  lumbar herniated disc recurrent, L5/S1  POST-OPERATIVE DIAGNOSIS:  lumbar herniated disc recurrent, L5/S1  PROCEDURE:  Procedure(s):redo Left Lumbar five-Sacral one microdiskectomy  SURGEON:   Surgeon(s): Ashok Pall, MD Kevan Ny Ditty, MD  ASSISTANTS:Ditty, Marland Kitchen  ANESTHESIA:   general  EBL:  Total I/O In: 1400 [I.V.:1400] Out: 20 [Blood:20]  BLOOD ADMINISTERED:none  CELL SAVER GIVEN:none  COUNT:per nursing  DRAINS: none   SPECIMEN:  No Specimen  DICTATION: [name] was taken to the operating room, intubated and placed under a general anesthetic without difficulty. She was positioned prone on a Wilson frame with all pressure points padded. Her back was prepped and draped in a sterile manner. I opened the skin with a 10 blade and carried the dissection down to the thoracolumbar fascia. I used both sharp dissection and the monopolar cautery to expose the lamina of L4,5, and S1. I confirmed my location with an intraoperative xray.  I used the drill, Kerrison punches, and curettes to perform a semihemilaminectomy of L5.I used back angled curettes to remove the scar tissue from the lateral bone. I using blunt dissection retracted gradually but steadily against the S1 root to free it from the annulus. While doing this I did open the nerve roots sleeve, but no csf was encountered, nor were any rootlets avulsed. With time I was able to place an eleven blade into the disc space. The annulus was markedly calcified. I brought the microscope into the operative field and with Dr.Ditty's assistance we started our decompression of the spinal canal, thecal sac and S1 root(s). I cauterized epidural veins overlying the disc space then divided them sharply. I used a variety of hooks and epstein curettes to remove what was soft disc material that had herniated from the disc space. We removed a  number of smaller to medium sized free fragments with the rongeurs, punches, and hooks.The disc space was collapsed.  After the discectomy was completed we inspected the S1 nerve root and felt it was well decompressed. I explored rostrally, laterally, medially, and caudally and was satisfied with the decompression. I placed a piece of duragen over the nerve root sleeve. I irrigated the wound, then closed in layers. I approximated the thoracolumbar fascia, subcutaneous, and subcuticular planes with vicryl sutures. I used dermabond for a sterile dressing.   PLAN OF CARE: Admit for overnight observation  PATIENT DISPOSITION:  PACU - hemodynamically stable.   Delay start of Pharmacological VTE agent (>24hrs) due to surgical blood loss or risk of bleeding:  yes

## 2015-09-13 NOTE — Transfer of Care (Signed)
Immediate Anesthesia Transfer of Care Note  Patient: [name]  Procedure(s) Performed: Procedure(s) with comments: Left Lumbar five-Sacral one microdiskectomy (Left) - Left Lumbar five-Sacral one microdiskectomy  Patient Location: PACU  Anesthesia Type:General  Level of Consciousness: awake  Airway & Oxygen Therapy: Patient Spontanous Breathing and Patient connected to nasal cannula oxygen  Post-op Assessment: Report given to RN and Post -op Vital signs reviewed and stable  Post vital signs: Reviewed and stable  Last Vitals:  Filed Vitals:   09/13/15 1316  BP: 128/79  Pulse: 91  Temp: 36.8 C  Resp: 16    Complications: No apparent anesthesia complications

## 2015-09-13 NOTE — Anesthesia Preprocedure Evaluation (Signed)
Anesthesia Evaluation  Patient identified by MRN, date of birth, ID band Patient awake    Reviewed: Allergy & Precautions, NPO status , Patient's Chart, lab work & pertinent test results  Airway Mallampati: II  TM Distance: >3 FB Neck ROM: Full    Dental no notable dental hx.    Pulmonary neg pulmonary ROS,    Pulmonary exam normal breath sounds clear to auscultation       Cardiovascular hypertension, Pt. on medications Normal cardiovascular exam Rhythm:Regular Rate:Normal     Neuro/Psych negative neurological ROS  negative psych ROS   GI/Hepatic negative GI ROS, Neg liver ROS,   Endo/Other  diabetes, Insulin Dependent  Renal/GU negative Renal ROS  negative genitourinary   Musculoskeletal negative musculoskeletal ROS (+)   Abdominal   Peds negative pediatric ROS (+)  Hematology negative hematology ROS (+)   Anesthesia Other Findings   Reproductive/Obstetrics negative OB ROS                             Anesthesia Physical Anesthesia Plan  ASA: III  Anesthesia Plan: General   Post-op Pain Management:    Induction: Intravenous  Airway Management Planned: Oral ETT  Additional Equipment:   Intra-op Plan:   Post-operative Plan: Extubation in OR  Informed Consent: I have reviewed the patients History and Physical, chart, labs and discussed the procedure including the risks, benefits and alternatives for the proposed anesthesia with the patient or authorized representative who has indicated his/her understanding and acceptance.   Dental advisory given  Plan Discussed with: CRNA and Surgeon  Anesthesia Plan Comments:        Anesthesia Quick Evaluation  

## 2015-09-13 NOTE — H&P (Signed)
  BP 128/79 mmHg  Pulse 91  Temp(Src) 98.2 F (36.8 C) (Oral)  Resp 16  Ht 5\' 2"  (1.575 m)  Wt 84.369 kg (186 lb)  BMI 34.01 kg/m2  SpO2 100%  LMP 08/06/2015 HISTORY OF PRESENT ILLNESS: [name] is a former patient of mine whom I took to the operating room for a herniated disk at L5-S1 on the left side in 2011. She did well after that operation, having no problems until approximately two weeks ago. She had the sudden onset of severe and acute pain just after waking up in the left lower extremity. She has been given steroids. She has also received some injections, all of which have provided no relief. She, therefore, comes in today for evaluation of possible options. 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  REVIEW OF SYSTEMS: Review of systems is positive for hypertension, back pain, leg pain, and diabetes. She denies constitutional, eye, ear, nose, throat, mouth, genitourinary, gastrointestinal, skin, neurological, psychiatric, hematologic, and allergic problems. On her chart she lists pain across her back in the left lower extremity. She said the pain started the end of December. PAST MEDICAL HISTORY: Past medical history also includes diabetes and hypertension.   Prior Operations: She has undergone two cesarean sections and has had surgery by myself in 05/2010.   Medications and Allergies: She takes Lantus, Janumet, hydrochlorothiazide, propranolol, vitamin D, pravastatin, and hydrocodone. FAMILY HISTORY: Mother is 7, is in fair health, has diabetes and hypertension. Father is deceased.  SOCIAL HISTORY: She does not smoke. She does not use alcohol. She does not use illicit drugs.  PHYSICAL EXAMINATION: Vital signs are as follows: Height 5 feet 2 inches, weight 179.6 pounds, temperature is 99.1 degrees Fahrenheit, blood pressure is 118/84, pulse is 76, respiratory rate is 16. NEUROLOGICAL EXAMINATION: On exam she is alert, oriented x4, and answering all questions  appropriately. Memory, language, attention span and fund of knowledge are normal. Speech is clear. It is also fluent. She is able to toe walk, heel walk with ease, although some mild difficulties with toe walking on the left side. She has 2+ reflexes biceps, triceps, brachioradialis, knees, right ankle; 1+ left ankle. No clonus. She has no Hoffman sign. She has normal muscle tone, bulk and coordination. Gait is normal. Pupils equal, round and reactive to light. Full extraocular movements. Full visual fields. Hearing intact to voice. Symmetric facial movements and sensation. Uvula elevates in the midline. Shoulder shrug is normal. Tongue protrudes in the midline. IMAGING STUDIES: MRI shows a large recurrent disk herniation eccentric to the left side at L5-S1. She shows some mild degeneration at other levels along with the L5-S1 disk space. The conus is normal. The cauda equina is normal. There are no abnormalities in the paraspinous soft tissue which I am able to appreciate. DIAGNOSES:  1. Displaced disk, left L5-S1. 2. Left S1 radiculopathy. PLAN: [name] would like to pursue surgical option at this point in time. She has had it before and obtained good relief. She did not wish to pursue injections as they have been ineffective. I think given the size of the disk and some mild difficulties she did have with toe walking on the left side is present. Risks and benefits she is well aware of, having had the procedure before.

## 2015-09-14 ENCOUNTER — Encounter (HOSPITAL_COMMUNITY): Payer: Self-pay | Admitting: Neurosurgery

## 2015-09-14 DIAGNOSIS — M5116 Intervertebral disc disorders with radiculopathy, lumbar region: Secondary | ICD-10-CM | POA: Diagnosis not present

## 2015-09-14 LAB — GLUCOSE, CAPILLARY
GLUCOSE-CAPILLARY: 341 mg/dL — AB (ref 65–99)
GLUCOSE-CAPILLARY: 260 mg/dL — AB (ref 65–99)
GLUCOSE-CAPILLARY: 259 mg/dL — AB (ref 65–99)
Glucose-Capillary: 147 mg/dL — ABNORMAL HIGH (ref 65–99)

## 2015-09-14 MED ORDER — HYDROCODONE-ACETAMINOPHEN 7.5-325 MG PO TABS: 1.0000 | ORAL_TABLET | Freq: Four times a day (QID) | ORAL | Status: DC | PRN

## 2015-09-14 MED ORDER — TIZANIDINE HCL 4 MG PO TABS: 4.0000 mg | ORAL_TABLET | Freq: Four times a day (QID) | ORAL | Status: DC | PRN

## 2015-09-14 NOTE — Discharge Summary (Signed)
  Physician Discharge Summary  Patient ID: [name] MRN: [id number] DOB/AGE: 39-Aug-1977 39 y.o.  Admit date: 09/13/2015 Discharge date: 09/14/2015  Admission Diagnoses:HNP recurrence Left L5/S1  Discharge Diagnoses: same Active Problems:   HNP (herniated nucleus pulposus), lumbar   Discharged Condition: good  Hospital Course: Mrs. Bergen was admitted and taken to the operating room for an uncomplicated lumbar redo laminectomy. The annulus was heavily calcified, and the space collapsed. The nerve root sleeve was broached but there was no csf at anytime in the wound. She is improved since her procedure. Her wound is clean, dry, and without signs of infection.   Treatments: surgery: as above  Discharge Exam: Blood pressure 96/53, pulse 84, temperature 98.4 F (36.9 C), temperature source Oral, resp. rate 20, height 5\' 2"  (1.575 m), weight 85.7 kg (188 lb 15 oz), last menstrual period 08/06/2015, SpO2 98 %. General appearance: alert, cooperative, appears stated age and no distress Neurologic: Alert and oriented X 3, normal strength and tone. Normal symmetric reflexes. Normal coordination and gait  Disposition: 01-Home or Self Care lumbar herniated disc    Medication List    TAKE these medications        hydrochlorothiazide 25 MG tablet  Commonly known as:  HYDRODIURIL  TAKE 1 TABLET BY MOUTH DAILY     HYDROcodone-acetaminophen 7.5-325 MG tablet  Commonly known as:  NORCO  Take 1 tablet by mouth every 6 (six) hours as needed for moderate pain.     ibuprofen 800 MG tablet  Commonly known as:  ADVIL,MOTRIN  Take 1 tablet (800 mg total) by mouth 3 (three) times daily.     Insulin Glargine 100 UNIT/ML Solostar Pen  Commonly known as:  LANTUS SOLOSTAR  Inject 30 Units into the skin daily at 10 pm.     Insulin Pen Needle 31G X 5 MM Misc  Commonly known as:  B-D UF III MINI PEN NEEDLES  1 each by Does not apply route at bedtime.     JANUMET 50-1000 MG tablet   Generic drug:  sitaGLIPtin-metformin  TAKE 1 TABLET BY MOUTH TWICE DAILY     omeprazole 20 MG capsule  Commonly known as:  PRILOSEC  TAKE 1 CAPSULE BY MOUTH EVERY DAY     pravastatin 10 MG tablet  Commonly known as:  PRAVACHOL  Take 1 tablet (10 mg total) by mouth at bedtime.     propranolol 40 MG tablet  Commonly known as:  INDERAL  TAKE 1 TABLET BY MOUTH TWICE DAILY     ranitidine 150 MG capsule  Commonly known as:  ZANTAC  Take 1 capsule (150 mg total) by mouth 2 (two) times daily.     tiZANidine 4 MG tablet  Commonly known as:  ZANAFLEX  Take 1 tablet (4 mg total) by mouth every 6 (six) hours as needed for muscle spasms.     Vitamin D (Ergocalciferol) 50000 units Caps capsule  Commonly known as:  DRISDOL  Take 1 capsule (50,000 Units total) by mouth every 7 (seven) days.           Follow-up Information    Follow up with Kyzen Horn L, MD In 3 weeks.   Specialty:  Neurosurgery   Why:  call office to make an appointment   Contact information:   [address] [place] [address] [phone]       Signed: Winfield Cunas 09/14/2015, 5:10 PM

## 2015-09-14 NOTE — Discharge Instructions (Signed)
Lumbar Discectomy °Care After °A discectomy involves removal of discmaterial (the cartilage-like structures located between the bones of the back). It is done to relieve pressure on nerve roots. It can be used as a treatment for a back problem. The time in surgery depends on the findings in surgery and what is necessary to correct the problems. °HOME CARE INSTRUCTIONS  °· Check the cut (incision) made by the surgeon twice a day for signs of infection. Some signs of infection may include:  °· A foul smelling, greenish or yellowish discharge from the wound.  °· Increased pain.  °· Increased redness over the incision (operative) site.  °· The skin edges may separate.  °· Flu-like symptoms (problems).  °· A temperature above 101.5° F (38.6° C).  °· Change your bandages in about 24 to 36 hours following surgery or as directed.  °· You may shower tomrrow.  Avoid bathtubs, swimming pools and hot tubs for three weeks or until your incision has healed completely. °· Follow your doctor's instructions as to safe activities, exercises, and physical therapy.  °· Weight reduction may be beneficial if you are overweight.  °· Daily exercise is helpful to prevent the return of problems. Walking is permitted. You may use a treadmill without an incline. Cut down on activities and exercise if you have discomfort. You may also go up and down stairs as much as you can tolerate.  °· DO NOT lift anything heavier than 10 to 15 lbs. Avoid bending or twisting at the waist. Always bend your knees when lifting.  °· Maintain strength and range of motion as instructed.  °· Do not drive for 10 days, or as directed by your doctors. You may be a passenger . Lying back in the passenger seat may be more comfortable for you. Always wear a seatbelt.  °· Limit your sitting in a regular chair to 20 to 30 minutes at a time. There are no limitations for sitting in a recliner. You should lie down or walk in between sitting periods.  °· Only take  over-the-counter or prescription medicines for pain, discomfort, or fever as directed by your caregiver.  °SEEK MEDICAL CARE IF:  °· There is increased bleeding (more than a small spot) from the wound.  °· You notice redness, swelling, or increasing pain in the wound.  °· Pus is coming from wound.  °· You develop an unexplained oral temperature above 102° F (38.9° C) develops.  °· You notice a foul smell coming from the wound or dressing.  °· You have increasing pain in your wound.  °SEEK IMMEDIATE MEDICAL CARE IF:  °· You develop a rash.  °· You have difficulty breathing.  °· You develop any allergic problems to medicines given.  °Document Released: 07/03/2004 Document Revised: 07/18/2011 Document Reviewed: 10/22/2007 °ExitCare® Patient Information °

## 2015-09-14 NOTE — Progress Notes (Signed)
Pt is a diabetic with no sliding scale insulin orders.  Pt has oral medications and lantus at bedtime. CBG [id number], called MD, awaiting return call.  Will continue to monitor. Cori Razor, RN

## 2015-09-14 NOTE — Progress Notes (Signed)
Discharge orders received.  Discharge instructions and follow-up appointments reviewed with the patient.  VSS upon discharge.  IV removed and education complete.  Transported out via wheelchair.   Livvy Spilman M, RN 

## 2015-09-14 NOTE — Care Management Note (Signed)
Case Management Note  Patient Details  Name: [name] MRN: [id number] Date of Birth: 01-05-1976  Subjective/Objective:                    Action/Plan: Patient was admitted for a Left Lumbar five-Sacral one microdiskectomy. Lives at home with spouse. Will follow for discharge needs.  Expected Discharge Date:                  Expected Discharge Plan:     In-House Referral:     Discharge planning Services     Post Acute Care Choice:    Choice offered to:     DME Arranged:    DME Agency:     HH Arranged:    HH Agency:     Status of Service:  In process, will continue to follow  Medicare Important Message Given:    Date Medicare IM Given:    Medicare IM give by:    Date Additional Medicare IM Given:    Additional Medicare Important Message give by:     If discussed at [place] of Stay Meetings, dates discussed:    Additional Comments:  Rolm Baptise, RN 09/14/2015, 11:35 AM [phone]

## 2015-09-14 NOTE — Progress Notes (Signed)
Pt transferred from PACU to unit via RN x 2. Pt alert and oriented upon arrival. Pt complaints are of pre surgical leg pain. No signs or symptoms of acute distress. Pt oriented to unit as well as unit procedures. Pt now resting in bed at lowest position, bed alarm on, call light in reach. Will continue to monitor. Fortino Sic, RN, BSN 09/14/2015 1:55 AM

## 2015-10-01 ENCOUNTER — Other Ambulatory Visit: Payer: Self-pay | Admitting: "Endocrinology

## 2015-10-02 ENCOUNTER — Other Ambulatory Visit: Payer: Self-pay | Admitting: "Endocrinology

## 2015-10-02 LAB — CBC
HCT: 36.3 % (ref 36.0–46.0)
Hemoglobin: 12 g/dL (ref 12.0–15.0)
MCH: 26.8 pg (ref 26.0–34.0)
MCHC: 33.1 g/dL (ref 30.0–36.0)
MCV: 81.2 fL (ref 78.0–100.0)
MPV: 11.4 fL (ref 8.6–12.4)
PLATELETS: 237 10*3/uL (ref 150–400)
RBC: 4.47 MIL/uL (ref 3.87–5.11)
RDW: 13.8 % (ref 11.5–15.5)
WBC: 7.5 10*3/uL (ref 4.0–10.5)

## 2015-10-02 LAB — BASIC METABOLIC PANEL
BUN: 10 mg/dL (ref 7–25)
CALCIUM: 9.7 mg/dL (ref 8.6–10.2)
CO2: 28 mmol/L (ref 20–31)
CREATININE: 0.89 mg/dL (ref 0.50–1.10)
Chloride: 97 mmol/L — ABNORMAL LOW (ref 98–110)
Glucose, Bld: 166 mg/dL — ABNORMAL HIGH (ref 65–99)
Potassium: 3.8 mmol/L (ref 3.5–5.3)
Sodium: 138 mmol/L (ref 135–146)

## 2015-10-02 LAB — COMPLETE METABOLIC PANEL WITH GFR
ALT: 21 U/L (ref 6–29)
AST: 14 U/L (ref 10–30)
Albumin: 4 g/dL (ref 3.6–5.1)
Alkaline Phosphatase: 73 U/L (ref 33–115)
BUN: 10 mg/dL (ref 7–25)
CALCIUM: 9.7 mg/dL (ref 8.6–10.2)
CHLORIDE: 98 mmol/L (ref 98–110)
CO2: 28 mmol/L (ref 20–31)
CREATININE: 0.92 mg/dL (ref 0.50–1.10)
GFR, Est African American: >89 mL/min (ref >=60)
GFR, Est Non African American: 79 mL/min (ref >=60)
GLUCOSE: 165 mg/dL — AB (ref 65–99)
POTASSIUM: 3.8 mmol/L (ref 3.5–5.3)
SODIUM: 140 mmol/L (ref 135–146)
Total Bilirubin: 0.4 mg/dL (ref 0.2–1.2)
Total Protein: 7 g/dL (ref 6.1–8.1)

## 2015-10-02 LAB — LIPID PANEL
CHOL/HDL RATIO: 3.4 ratio (ref <=5.0)
Cholesterol: 146 mg/dL (ref 125–200)
HDL: 43 mg/dL — ABNORMAL LOW (ref >=46)
LDL CALC: 84 mg/dL (ref <130)
Triglycerides: 93 mg/dL (ref <150)
VLDL: 19 mg/dL (ref <30)

## 2015-10-02 LAB — HEMOGLOBIN A1C
Hgb A1c MFr Bld: 8.1 % — ABNORMAL HIGH (ref <5.7)
MEAN PLASMA GLUCOSE: 186 mg/dL — AB (ref <117)

## 2015-10-10 ENCOUNTER — Encounter: Payer: Self-pay | Admitting: "Endocrinology

## 2015-10-10 ENCOUNTER — Encounter: Payer: Self-pay | Admitting: Family Medicine

## 2015-10-10 ENCOUNTER — Ambulatory Visit (INDEPENDENT_AMBULATORY_CARE_PROVIDER_SITE_OTHER): Payer: BLUE CROSS/BLUE SHIELD | Admitting: Family Medicine

## 2015-10-10 ENCOUNTER — Ambulatory Visit (INDEPENDENT_AMBULATORY_CARE_PROVIDER_SITE_OTHER): Payer: BLUE CROSS/BLUE SHIELD | Admitting: "Endocrinology

## 2015-10-10 VITALS — BP 122/80 | HR 84 | Resp 16 | Ht 62.0 in | Wt 182.0 lb

## 2015-10-10 VITALS — BP 124/80 | HR 82 | Ht 62.0 in | Wt 183.0 lb

## 2015-10-10 DIAGNOSIS — E1169 Type 2 diabetes mellitus with other specified complication: Secondary | ICD-10-CM

## 2015-10-10 DIAGNOSIS — E785 Hyperlipidemia, unspecified: Secondary | ICD-10-CM

## 2015-10-10 DIAGNOSIS — I10 Essential (primary) hypertension: Secondary | ICD-10-CM | POA: Diagnosis not present

## 2015-10-10 DIAGNOSIS — M5432 Sciatica, left side: Secondary | ICD-10-CM

## 2015-10-10 DIAGNOSIS — E669 Obesity, unspecified: Secondary | ICD-10-CM

## 2015-10-10 DIAGNOSIS — E119 Type 2 diabetes mellitus without complications: Secondary | ICD-10-CM

## 2015-10-10 DIAGNOSIS — IMO0001 Reserved for inherently not codable concepts without codable children: Secondary | ICD-10-CM

## 2015-10-10 DIAGNOSIS — E559 Vitamin D deficiency, unspecified: Secondary | ICD-10-CM

## 2015-10-10 NOTE — Progress Notes (Signed)
Subjective:    Patient ID: [name], female    DOB: [birth year], PCP [name], MD   Past Medical History  Diagnosis Date  . GERD (gastroesophageal reflux disease)   . Hypertension   . Constipation   . Morbid obesity ([place])   . NIDDM (non-insulin dependent diabetes mellitus) 2005    Type II   Past Surgical History  Procedure Laterality Date  . Cesarean section  6/09, 2015  . Back surgery  05/25/2010    Dr,Kabell   . Lumbar laminectomy/decompression microdiscectomy Left 09/13/2015    Procedure: Left Lumbar five-Sacral one microdiskectomy;  Surgeon: Ashok Pall, MD;  Location: [facility];  Service: Neurosurgery;  Laterality: Left;  Left Lumbar five-Sacral one microdiskectomy   Social History   Social History  . Marital Status: Married    Spouse Name: N/A  . Number of Children: N/A  . Years of Education: N/A   Occupational History  . unemployed     Social History Main Topics  . Smoking status: Never Smoker   . Smokeless tobacco: Never Used  . Alcohol Use: No  . Drug Use: No  . Sexual Activity: Yes    Birth Control/ Protection: None   Other Topics Concern  . None   Social History Narrative   Outpatient Encounter Prescriptions as of 10/10/2015  Medication Sig  . Insulin Glargine (LANTUS SOLOSTAR) 100 UNIT/ML Solostar Pen Inject 30 Units into the skin daily at 10 pm.  . hydrochlorothiazide (HYDRODIURIL) 25 MG tablet TAKE 1 TABLET BY MOUTH DAILY  . ibuprofen (ADVIL,MOTRIN) 800 MG tablet Take 1 tablet (800 mg total) by mouth 3 (three) times daily.  . Insulin Pen Needle (B-D UF III MINI PEN NEEDLES) 31G X 5 MM MISC 1 each by Does not apply route at bedtime.  Marland Kitchen JANUMET 50-1000 MG tablet TAKE 1 TABLET BY MOUTH TWICE DAILY  . omeprazole (PRILOSEC) 20 MG capsule TAKE 1 CAPSULE BY MOUTH EVERY DAY (Patient taking differently: TAKE 1 CAPSULE BY MOUTH DAILY AS NEEDED)  . pravastatin (PRAVACHOL) 10 MG tablet Take 1 tablet (10 mg total) by mouth at bedtime.   . propranolol (INDERAL) 40 MG tablet TAKE 1 TABLET BY MOUTH TWICE DAILY  . Vitamin D, Ergocalciferol, (DRISDOL) 50000 units CAPS capsule TAKE 1 CAPSULE BY MOUTH EVERY 7 DAYS   No facility-administered encounter medications on file as of 10/10/2015.   ALLERGIES: Allergies  Allergen Reactions  . Ace Inhibitors Cough  . Amlodipine Besylate     REACTION: severe edema of legs   VACCINATION STATUS: Immunization History  Administered Date(s) Administered  . Influenza Split 05/20/2011, 05/17/2014  . Influenza Whole 06/09/2006, 05/02/2008, 03/06/2010  . Influenza,inj,Quad PF,36+ Mos 05/17/2015  . Pneumococcal Polysaccharide-23 08/14/2009  . Td 12/05/2009    Diabetes She presents for her follow-up diabetic visit. She has type 2 diabetes mellitus. Onset time: She was diagnosed at age of 63 years. Her disease course has been improving. There are no hypoglycemic associated symptoms. Pertinent negatives for hypoglycemia include no confusion, headaches, pallor or seizures. Pertinent negatives for diabetes include no blurred vision, no chest pain, no fatigue, no polydipsia, no polyphagia and no polyuria. There are no hypoglycemic complications. Symptoms are improving (Improving but still persistently above target blood glucose profile.). There are no diabetic complications. Risk factors for coronary artery disease include diabetes mellitus, dyslipidemia, hypertension, obesity and sedentary lifestyle. Current diabetic treatment includes oral agent (dual therapy). She is compliant with treatment most of the time. Her weight is stable.  She is following a generally unhealthy diet. She has had a previous visit with a dietitian. She never participates in exercise. Home blood sugar record trend: Her A1c increased from 8.4% to 12 .1%. Her breakfast blood glucose range is generally 140-180 mg/dl. Her dinner blood glucose range is generally 140-180 mg/dl. Her overall blood glucose range is >200 mg/dl. An ACE  inhibitor/angiotensin II receptor blocker is contraindicated.  Hyperlipidemia This is a chronic problem. The current episode started more than 1 year ago. Pertinent negatives include no chest pain, myalgias or shortness of breath. Current antihyperlipidemic treatment includes statins. Risk factors for coronary artery disease include dyslipidemia, diabetes mellitus, hypertension, obesity and a sedentary lifestyle.  Hypertension This is a chronic problem. The current episode started more than 1 year ago. Pertinent negatives include no blurred vision, chest pain, headaches, palpitations or shortness of breath. Past treatments include diuretics. The current treatment provides moderate improvement.     Review of Systems  Constitutional: Negative for fatigue and unexpected weight change.  HENT: Negative for trouble swallowing and voice change.   Eyes: Negative for blurred vision and visual disturbance.  Respiratory: Negative for cough, shortness of breath and wheezing.   Cardiovascular: Negative for chest pain, palpitations and leg swelling.  Gastrointestinal: Negative for nausea, vomiting and diarrhea.  Endocrine: Negative for cold intolerance, heat intolerance, polydipsia, polyphagia and polyuria.  Musculoskeletal: Negative for myalgias and arthralgias.  Skin: Negative for color change, pallor, rash and wound.  Neurological: Negative for seizures and headaches.  Psychiatric/Behavioral: Negative for suicidal ideas and confusion.    Objective:    BP 124/80 mmHg  Pulse 82  Ht 5\' 2"  (1.575 m)  Wt 183 lb (83.008 kg)  BMI 33.46 kg/m2  SpO2 98%  Wt Readings from Last 3 Encounters:  10/10/15 183 lb (83.008 kg)  10/10/15 182 lb (82.555 kg)  09/13/15 188 lb 15 oz (85.7 kg)    Physical Exam  Constitutional: She is oriented to person, place, and time. She appears well-developed.  HENT:  Head: Normocephalic and atraumatic.  Eyes: EOM are normal.  Neck: Normal range of motion. Neck supple. No  tracheal deviation present. No thyromegaly present.  Cardiovascular: Normal rate and regular rhythm.   Pulmonary/Chest: Effort normal and breath sounds normal.  Abdominal: Soft. Bowel sounds are normal. There is no tenderness. There is no guarding.  Musculoskeletal: Normal range of motion. She exhibits no edema.  Neurological: She is alert and oriented to person, place, and time. She has normal reflexes. No cranial nerve deficit. Coordination normal.  Skin: Skin is warm and dry. No rash noted. No erythema. No pallor.  Psychiatric: She has a normal mood and affect. Judgment normal.    Results for orders placed or performed in visit on [id number]  Basic metabolic panel  Result Value Ref Range   Sodium 138 135 - 146 mmol/L   Potassium 3.8 3.5 - 5.3 mmol/L   Chloride 97 (L) 98 - 110 mmol/L   CO2 28 20 - 31 mmol/L   Glucose, Bld 166 (H) 65 - 99 mg/dL   BUN 10 7 - 25 mg/dL   Creat 0.89 0.50 - 1.10 mg/dL   Calcium 9.7 8.6 - 10.2 mg/dL  Hemoglobin A1c  Result Value Ref Range   Hgb A1c MFr Bld 8.1 (H) <5.7 %   Mean Plasma Glucose 186 (H) <117 mg/dL   Diabetic Labs (most recent): Lab Results  Component Value Date   HGBA1C 8.1* 10/02/2015   HGBA1C 12.1* 07/10/2015   HGBA1C 8.4*  02/22/2015   Lipid Panel     Component Value Date/Time   CHOL 146 10/02/2015 0824   TRIG 93 10/02/2015 0824   HDL 43* 10/02/2015 0824   CHOLHDL 3.4 10/02/2015 0824   VLDL 19 10/02/2015 0824   LDLCALC 84 10/02/2015 0824     Assessment & Plan:   1. Diabetes mellitus type 2 in obese [facility])  - patient remains at a high risk for more acute and chronic complications of diabetes which include CAD, CVA, CKD, retinopathy, and neuropathy. These are all discussed in detail with the patient.  Patient came with controlled fasting and bedtime blood glucose readings. Her A1c has improved to 8.1% from 12.1%.  Recent labs reviewed.  - I have re-counseled the patient on diet management and weight loss  by adopting a  carbohydrate restricted / protein rich  Diet.  - Suggestion is made for patient to avoid simple carbohydrates   from their diet including Cakes , Desserts, Ice Cream,  Soda (  diet and regular) , Sweet Tea , Candies,  Chips, Cookies, Artificial Sweeteners,   and "Sugar-free" Products .  This will help patient to have stable blood glucose profile and potentially avoid unintended  Weight gain.  - Patient is advised to stick to a routine mealtimes to eat 3 meals  a day and avoid unnecessary snacks ( to snack only to correct hypoglycemia).  - The patient  has been  scheduled with Jearld Fenton, RDN, CDE for individualized DM education.  - I have approached patient with the following individualized plan to manage diabetes and patient agrees.  - She will  continue to need basal insulin,  Lantus 30  units QHS, associated with strict monitoring of glucose  AC  Breakfast.  -If she cannot control with increased basal insulin along with Janumet she will be considered for prandial insulin next visit .  -Patient is encouraged to call clinic for blood glucose levels less than 70 or above 300 mg /dl.  - I will continue Janumet 50/1000mg  po BID, therapeutically suitable for patient.   - Patient specific target  for A1c; LDL, HDL, Triglycerides, and  Waist Circumference were discussed in detail.  2) BP/HTN: Controlled. Continue current medications. 3) Lipids/HPL:  continue statins.  4)  Weight/Diet: CDE consult in progress, exercise, and carbohydrates information provided.  5) Vitamin D deficiency -I  advised her to continue vitamin D 50,000 units weekly for the next 12 weeks.  6) Chronic Care/Health Maintenance:  -Patient is on a Statin medications (allergic to ACE inhibitors) and encouraged to continue to follow up with Ophthalmology, Podiatrist at least yearly or according to recommendations, and advised to  stay away from smoking. I have recommended yearly flu vaccine and pneumonia vaccination at  least every 5 years; moderate intensity exercise for up to 150 minutes weekly; and  sleep for at least 7 hours a day.  - 25 minutes of time was spent on the care of this patient , 50% of which was applied for counseling on diabetes complications and their preventions.  - I advised patient to maintain close follow up with [name], MD for primary care needs.  Patient is asked to bring meter and  blood glucose logs during their next visit.   Follow up plan: -Return in about 3 months (around 01/07/2016) for diabetes, high blood pressure, high cholesterol, follow up with pre-visit labs, meter, and logs.  Glade Lloyd, MD Phone: [phone]  Fax: [phone]   10/10/2015, 11:27 AM

## 2015-10-10 NOTE — Patient Instructions (Addendum)
F/u in 6 month, call if you need me sooner  Thankful much improved  Please work on good  health habits so that your health will improve. 1. Commitment to daily physical activity for 30 to 60  minutes, if you are able to do this.  2. Commitment to wise food choices. Aim for half of your  food intake to be vegetable and fruit, one quarter starchy foods, and one quarter protein. Try to eat on a regular schedule  3 meals per day, snacking between meals should be limited to vegetables or fruits or small portions of nuts. 64 ounces of water per day is generally recommended, unless you have specific health conditions, like heart failure or kidney failure where you will need to limit fluid intake.  3. Commitment to sufficient and a  good quality of physical and mental rest daily, generally between 6 to 8 hours per day.  WITH PERSISTANCE AND PERSEVERANCE, THE IMPOSSIBLE , BECOMES THE NORM!   No med change  TSH and vit D and chem 7 and EGFR July 14 or after whenever next getting labs for endo

## 2015-10-10 NOTE — Patient Instructions (Signed)

## 2015-10-10 NOTE — Progress Notes (Signed)
Subjective:    Patient ID: [name], female    DOB: Dec 25, 1975, 40 y.o.   MRN: [id number]  HPI   [name]     MRN: [id number]      DOB: 1976-08-08   HPI [name] is here for follow up and re-evaluation of chronic medical conditions, medication management and review of any available recent lab and radiology data.  Preventive health is updated, specifically  Cancer screening and Immunization.   Questions or concerns regarding consultations or procedures which the PT has had in the interim are  Addressed.Very happy with recent back surgery, now essentially pain free The PT denies any adverse reactions to current medications since the last visit.  There are no new concerns.  Denies polyuria, polydipsia, blurred vision , or hypoglycemic episodes. Marked improvement in blood sugars noted by patient ROS Denies recent fever or chills. Denies sinus pressure, nasal congestion, ear pain or sore throat. Denies chest congestion, productive cough or wheezing. Denies chest pains, palpitations and leg swelling Denies abdominal pain, nausea, vomiting,diarrhea or constipation.   Denies dysuria, frequency, hesitancy or incontinence. Denies uncontrolled  joint pain, swelling and limitation in mobility. Denies headaches, seizures, numbness, or tingling. Denies depression, anxiety or insomnia. Denies skin break down or rash.   PE  BP 122/80 mmHg  Pulse 84  Resp 16  Ht 5\' 2"  (1.575 m)  Wt 182 lb (82.555 kg)  BMI 33.28 kg/m2  SpO2 99%  Patient alert and oriented and in no cardiopulmonary distress.  HEENT: No facial asymmetry, EOMI,   oropharynx pink and moist.  Neck supple no JVD, no mass.  Chest: Clear to auscultation bilaterally.  CVS: S1, S2 no murmurs, no S3.Regular rate.  ABD: Soft non tender.   Ext: No edema  MS: Adequate ROM spine, shoulders, hips and knees.  Skin: Intact, no ulcerations or rash noted.Surgical scar over lumbar spine well healed  externally, no erythema or drainage  Psych: Good eye contact, normal affect. Memory intact not anxious or depressed appearing.  CNS: CN 2-12 intact, power,  normal throughout.no focal deficits noted.   Assessment & Plan   Essential hypertension Controlled, no change in medication DASH diet and commitment to daily physical activity for a minimum of 30 minutes discussed and encouraged, as a part of hypertension management. The importance of attaining a healthy weight is also discussed.  BP/Weight 10/10/2015 10/10/2015 09/14/2015 09/13/2015 08/29/2015 08/23/2015 0000000  Systolic BP A999333 123XX123 123XX123 - - 123456 0000000  Diastolic BP 80 80 68 - - 82 85  Wt. (Lbs) 183 182 - 188.93 186 186.4 182  BMI 33.46 33.28 - 34.55 34.01 34.08 33.28        Diabetes mellitus type 2 in obese Marked improvement , still not at goal, however , has recently been on mega doses of steroids due to back pain and disc disease Contnue management by [name] is reminded of the importance of commitment to daily physical activity for 30 minutes or more, as able and the need to limit carbohydrate intake to 30 to 60 grams per meal to help with blood sugar control.   The need to take medication as prescribed, test blood sugar as directed, and to call between visits if there is a concern that blood sugar is uncontrolled is also discussed.   [name] is reminded of the importance of daily foot exam, annual eye examination, and good blood sugar, blood pressure and cholesterol control.  Diabetic Labs Latest Ref Rng 10/02/2015  10/02/2015 09/13/2015 08/29/2015 07/20/2015  HbA1c <5.7 % 8.1(H) - - - -  Microalbumin Not estab mg/dL - - - - 1.0  Micro/Creat Ratio <30 mcg/mg creat - - - - 9  Chol 125 - 200 mg/dL - 146 - - -  HDL >=46 mg/dL - 43(L) - - -  Calc LDL <130 mg/dL - 84 - - -  Triglycerides <150 mg/dL - 93 - - -  Creatinine 0.50 - 1.10 mg/dL 0.89 0.92 0.88 1.00 -   BP/Weight 10/10/2015 10/10/2015 09/14/2015 09/13/2015 08/29/2015  08/23/2015 0000000  Systolic BP A999333 123XX123 123XX123 - - 123456 0000000  Diastolic BP 80 80 68 - - 82 85  Wt. (Lbs) 183 182 - 188.93 186 186.4 182  BMI 33.46 33.28 - 34.55 34.01 34.08 33.28   Foot/eye exam completion dates 07/12/2015 07/14/2014  Eye Exam - -  Foot exam Order - -  Foot Form Completion Done Done         Hyperlipidemia Hyperlipidemia:Low fat diet discussed and encouraged.   Lipid Panel  Lab Results  Component Value Date   CHOL 146 10/02/2015   HDL 43* 10/02/2015   LDLCALC 84 10/02/2015   TRIG 93 10/02/2015   CHOLHDL 3.4 10/02/2015   Needs to increase exercise to improve HDL, now able to as back surgery has been successfully performed     Obesity, Class II, BMI 35-39.9, with comorbidity Deteriorated. Patient re-educated about  the importance of commitment to a  minimum of 150 minutes of exercise per week.  The importance of healthy food choices with portion control discussed. Encouraged to start a food diary, count calories and to consider  joining a support group. Sample diet sheets offered. Goals set by the patient for the next several months.   Weight /BMI 10/10/2015 10/10/2015 09/13/2015  WEIGHT 183 lb 182 lb 188 lb 15 oz  HEIGHT 5\' 2"  5\' 2"  5\' 2"   BMI 33.46 kg/m2 33.28 kg/m2 34.55 kg/m2    Current exercise per week 90 minutes.   Vitamin D deficiency Weekly supplement to be continued  Back pain with left-sided sciatica Requires only ibuprofen for as needed use in 10/2015 following surgery in 09/2015, marked improvement     Review of Systems     Objective:   Physical Exam        Assessment & Plan:

## 2015-10-15 NOTE — Assessment & Plan Note (Signed)
Weekly supplement to be continued 

## 2015-10-15 NOTE — Assessment & Plan Note (Signed)
Hyperlipidemia:Low fat diet discussed and encouraged.   Lipid Panel  Lab Results  Component Value Date   CHOL 146 10/02/2015   HDL 43* 10/02/2015   LDLCALC 84 10/02/2015   TRIG 93 10/02/2015   CHOLHDL 3.4 10/02/2015   Needs to increase exercise to improve HDL, now able to as back surgery has been successfully performed

## 2015-10-15 NOTE — Assessment & Plan Note (Signed)
Requires only ibuprofen for as needed use in 10/2015 following surgery in 09/2015, marked improvement

## 2015-10-15 NOTE — Assessment & Plan Note (Signed)
Deteriorated. Patient re-educated about  the importance of commitment to a  minimum of 150 minutes of exercise per week.  The importance of healthy food choices with portion control discussed. Encouraged to start a food diary, count calories and to consider  joining a support group. Sample diet sheets offered. Goals set by the patient for the next several months.   Weight /BMI 10/10/2015 10/10/2015 09/13/2015  WEIGHT 183 lb 182 lb 188 lb 15 oz  HEIGHT 5\' 2"  5\' 2"  5\' 2"   BMI 33.46 kg/m2 33.28 kg/m2 34.55 kg/m2    Current exercise per week 90 minutes.

## 2015-10-15 NOTE — Assessment & Plan Note (Signed)
Controlled, no change in medication DASH diet and commitment to daily physical activity for a minimum of 30 minutes discussed and encouraged, as a part of hypertension management. The importance of attaining a healthy weight is also discussed.  BP/Weight 10/10/2015 10/10/2015 09/14/2015 09/13/2015 08/29/2015 08/23/2015 0000000  Systolic BP A999333 123XX123 123XX123 - - 123456 0000000  Diastolic BP 80 80 68 - - 82 85  Wt. (Lbs) 183 182 - 188.93 186 186.4 182  BMI 33.46 33.28 - 34.55 34.01 34.08 33.28

## 2015-10-15 NOTE — Assessment & Plan Note (Signed)
Marked improvement , still not at goal, however , has recently been on mega doses of steroids due to back pain and disc disease Contnue management by [name] is reminded of the importance of commitment to daily physical activity for 30 minutes or more, as able and the need to limit carbohydrate intake to 30 to 60 grams per meal to help with blood sugar control.   The need to take medication as prescribed, test blood sugar as directed, and to call between visits if there is a concern that blood sugar is uncontrolled is also discussed.   [name] is reminded of the importance of daily foot exam, annual eye examination, and good blood sugar, blood pressure and cholesterol control.  Diabetic Labs Latest Ref Rng 10/02/2015 10/02/2015 09/13/2015 08/29/2015 07/20/2015  HbA1c <5.7 % 8.1(H) - - - -  Microalbumin Not estab mg/dL - - - - 1.0  Micro/Creat Ratio <30 mcg/mg creat - - - - 9  Chol 125 - 200 mg/dL - 146 - - -  HDL >=46 mg/dL - 43(L) - - -  Calc LDL <130 mg/dL - 84 - - -  Triglycerides <150 mg/dL - 93 - - -  Creatinine 0.50 - 1.10 mg/dL 0.89 0.92 0.88 1.00 -   BP/Weight 10/10/2015 10/10/2015 09/14/2015 09/13/2015 08/29/2015 08/23/2015 0000000  Systolic BP A999333 123XX123 123XX123 - - 123456 0000000  Diastolic BP 80 80 68 - - 82 85  Wt. (Lbs) 183 182 - 188.93 186 186.4 182  BMI 33.46 33.28 - 34.55 34.01 34.08 33.28   Foot/eye exam completion dates 07/12/2015 07/14/2014  Eye Exam - -  Foot exam Order - -  Foot Form Completion Done Done

## 2015-10-27 ENCOUNTER — Other Ambulatory Visit: Payer: Self-pay | Admitting: Family Medicine

## 2015-10-27 ENCOUNTER — Other Ambulatory Visit: Payer: Self-pay | Admitting: "Endocrinology

## 2015-11-27 ENCOUNTER — Other Ambulatory Visit: Payer: Self-pay | Admitting: Family Medicine

## 2015-12-26 ENCOUNTER — Other Ambulatory Visit: Payer: Self-pay | Admitting: Family Medicine

## 2015-12-26 ENCOUNTER — Other Ambulatory Visit: Payer: Self-pay | Admitting: "Endocrinology

## 2016-01-12 ENCOUNTER — Ambulatory Visit: Payer: BLUE CROSS/BLUE SHIELD | Admitting: "Endocrinology

## 2016-01-25 ENCOUNTER — Other Ambulatory Visit: Payer: Self-pay | Admitting: "Endocrinology

## 2016-01-25 ENCOUNTER — Other Ambulatory Visit: Payer: Self-pay | Admitting: Family Medicine

## 2016-03-18 ENCOUNTER — Encounter: Payer: Self-pay | Admitting: Family Medicine

## 2016-03-18 ENCOUNTER — Ambulatory Visit (INDEPENDENT_AMBULATORY_CARE_PROVIDER_SITE_OTHER): Payer: BLUE CROSS/BLUE SHIELD | Admitting: Family Medicine

## 2016-03-18 VITALS — BP 114/70 | HR 77 | Resp 16 | Ht 62.0 in | Wt 185.0 lb

## 2016-03-18 DIAGNOSIS — E785 Hyperlipidemia, unspecified: Secondary | ICD-10-CM

## 2016-03-18 DIAGNOSIS — K219 Gastro-esophageal reflux disease without esophagitis: Secondary | ICD-10-CM

## 2016-03-18 DIAGNOSIS — I10 Essential (primary) hypertension: Secondary | ICD-10-CM

## 2016-03-18 DIAGNOSIS — E669 Obesity, unspecified: Secondary | ICD-10-CM

## 2016-03-18 DIAGNOSIS — E119 Type 2 diabetes mellitus without complications: Secondary | ICD-10-CM

## 2016-03-18 DIAGNOSIS — M5432 Sciatica, left side: Secondary | ICD-10-CM

## 2016-03-18 DIAGNOSIS — E1169 Type 2 diabetes mellitus with other specified complication: Secondary | ICD-10-CM

## 2016-03-18 MED ORDER — GABAPENTIN 300 MG PO CAPS: 300.0000 mg | ORAL_CAPSULE | Freq: Every day | ORAL | 3 refills | Status: DC

## 2016-03-18 MED ORDER — METHYLPREDNISOLONE ACETATE 80 MG/ML IJ SUSP
80.0000 mg | Freq: Once | INTRAMUSCULAR | Status: AC
  Administered 2016-03-18: 80 mg via INTRAMUSCULAR

## 2016-03-18 MED ORDER — IBUPROFEN 800 MG PO TABS: 800.0000 mg | ORAL_TABLET | Freq: Three times a day (TID) | ORAL | 0 refills | Status: DC | PRN

## 2016-03-18 MED ORDER — HYDROCODONE-ACETAMINOPHEN 5-325 MG PO TABS: 1.0000 | ORAL_TABLET | Freq: Two times a day (BID) | ORAL | 0 refills | Status: DC

## 2016-03-18 MED ORDER — KETOROLAC TROMETHAMINE 60 MG/2ML IM SOLN
60.0000 mg | Freq: Once | INTRAMUSCULAR | Status: AC
  Administered 2016-03-18: 60 mg via INTRAMUSCULAR

## 2016-03-18 MED ORDER — PREDNISONE 5 MG (21) PO TBPK: 5.0000 mg | ORAL_TABLET | ORAL | 0 refills | Status: DC

## 2016-03-18 NOTE — Assessment & Plan Note (Signed)
Controlled, no change in medication DASH diet and commitment to daily physical activity for a minimum of 30 minutes discussed and encouraged, as a part of hypertension management. The importance of attaining a healthy weight is also discussed.  BP/Weight 03/18/2016 10/10/2015 10/10/2015 09/14/2015 09/13/2015 08/29/2015 [id number]  Systolic BP 99991111 [id number] 123XX123 [id number] - - 123456  Diastolic BP 70 80 80 68 - - 82  Wt. (Lbs) 185 183 182 - 188.93 186 186.4  BMI 33.84 33.46 33.28 - 34.55 34.01 34.08

## 2016-03-18 NOTE — Assessment & Plan Note (Signed)
Hyperlipidemia:Low fat diet discussed and encouraged.   Lipid Panel  Lab Results  Component Value Date   CHOL 146 10/02/2015   HDL 43 (L) 10/02/2015   LDLCALC 84 10/02/2015   TRIG 93 10/02/2015   CHOLHDL 3.4 10/02/2015    Controlled, no change in medication  Updated lab needed at/ before next visit.

## 2016-03-18 NOTE — Progress Notes (Signed)
[name]     MRN: [id number]      DOB: 06-15-1976   HPI [name] is here for follow up and re-evaluation of chronic medical conditions, medication management and review of any available recent lab and radiology data. Wants to change diabetic management esp now that her co pay for [employer] is up to $70 Denies polyuria, polydipsia, blurred vision , or hypoglycemic episodes.  Preventive health is updated, specifically  Cancer screening and Immunization.   Questions or concerns regarding consultations or procedures which the PT has had in the interim are  addressed. 3 day h/o acute LBP radiating to left calf, no aggravating or relieving factor, no change in sensation or incontinence, reduced LLE power due to pain , had rept lumbar surgery less than  6 months ago  ROS Denies recent fever or chills. Denies sinus pressure, nasal congestion, ear pain or sore throat. Denies chest congestion, productive cough or wheezing. Denies chest pains, palpitations and leg swelling Denies abdominal pain, nausea, vomiting,diarrhea or constipation.   Denies dysuria, frequency, hesitancy or incontinence. . Denies headaches, seizures, numbness, or tingling. Denies depression, anxiety or insomnia. Denies skin break down or rash.   PE  BP 114/70   Pulse 77   Resp 16   Ht 5\' 2"  (1.575 m)   Wt 185 lb (83.9 kg)   SpO2 99%   BMI 33.84 kg/m   Patient alert and oriented and in no cardiopulmonary distress.Pt in pain HEENT: No facial asymmetry, EOMI,   oropharynx pink and moist.  Neck supple no JVD, no mass.  Chest: Clear to auscultation bilaterally.  CVS: S1, S2 no murmurs, no S3.Regular rate.  ABD: Soft non tender.   Ext: No edema  MS: decreased ROM lumbar spine, normal in shoulders, hips and knees.  Skin: Intact, no ulcerations or rash noted.  Psych: Good eye contact, normal affect. Memory intact not anxious or depressed appearing.  CNS: CN 2-12 intact, grade 4  Power in LLE,  otherwise   normal throughout.no focal deficits noted.   Assessment & Plan  Back pain with left-sided sciatica 3 day h/o acute back pain radiaiting down lLE calf, rated at a 10, no change in sensation, no incontinence, no aggravating or relieving factors noted Had 2nd spine surgery for same symptoms less than 6 months ago Trial of medication only, pt to call in 4 days if no better Uncontrolled.Toradol and depo medrol administered IM in the office , to be followed by a short course of oral prednisone and NSAIDS.  Gabapentin and limited amt of hydrocodone also prescribed   Essential hypertension Controlled, no change in medication DASH diet and commitment to daily physical activity for a minimum of 30 minutes discussed and encouraged, as a part of hypertension management. The importance of attaining a healthy weight is also discussed.  BP/Weight 03/18/2016 10/10/2015 10/10/2015 09/14/2015 09/13/2015 08/29/2015 [id number]  Systolic BP 99991111 A999333 123XX123 [id number] - - 123456  Diastolic BP 70 80 80 68 - - 82  Wt. (Lbs) 185 183 182 - 188.93 186 186.4  BMI 33.84 33.46 33.28 - 34.55 34.01 34.08       Diabetes mellitus type 2 in obese Updated lab needed at/ before next visit. [name] is reminded of the importance of commitment to daily physical activity for 30 minutes or more, as able and the need to limit carbohydrate intake to 30 to 60 grams per meal to help with blood sugar control.   The need to take medication as  prescribed, test blood sugar as directed, and to call between visits if there is a concern that blood sugar is uncontrolled is also discussed.   [name] is reminded of the importance of daily foot exam, annual eye examination, and good blood sugar, blood pressure and cholesterol control.  Diabetic Labs Latest Ref Rng & Units 10/02/2015 10/02/2015 09/13/2015 08/29/2015 07/20/2015  HbA1c <5.7 % 8.1(H) - - - -  Microalbumin Not estab mg/dL - - - - 1.0  Micro/Creat Ratio <30 mcg/mg creat - - - - 9    Chol 125 - 200 mg/dL - 146 - - -  HDL >=46 mg/dL - 43(L) - - -  Calc LDL <130 mg/dL - 84 - - -  Triglycerides <150 mg/dL - 93 - - -  Creatinine 0.50 - 1.10 mg/dL 0.89 0.92 0.88 1.00 -   BP/Weight 03/18/2016 10/10/2015 10/10/2015 09/14/2015 09/13/2015 08/29/2015 [id number]  Systolic BP 99991111 A999333 123XX123 123XX123 - - 123456  Diastolic BP 70 80 80 68 - - 82  Wt. (Lbs) 185 183 182 - 188.93 186 186.4  BMI 33.84 33.46 33.28 - 34.55 34.01 34.08   Foot/eye exam completion dates 07/12/2015 07/14/2014  Eye Exam - -  Foot exam Order - -  Foot Form Completion Done Done        Hyperlipidemia Hyperlipidemia:Low fat diet discussed and encouraged.   Lipid Panel  Lab Results  Component Value Date   CHOL 146 10/02/2015   HDL 43 (L) 10/02/2015   LDLCALC 84 10/02/2015   TRIG 93 10/02/2015   CHOLHDL 3.4 10/02/2015    Controlled, no change in medication  Updated lab needed at/ before next visit.   GERD (gastroesophageal reflux disease) Controlled, no change in medication

## 2016-03-18 NOTE — Assessment & Plan Note (Signed)
Controlled, no change in medication  

## 2016-03-18 NOTE — Assessment & Plan Note (Addendum)
3 day h/o acute back pain radiaiting down lLE calf, rated at a 10, no change in sensation, no incontinence, no aggravating or relieving factors noted Had 2nd spine surgery for same symptoms less than 6 months ago Trial of medication only, pt to call in 4 days if no better Uncontrolled.Toradol and depo medrol administered IM in the office , to be followed by a short course of oral prednisone and NSAIDS.  Gabapentin and limited amt of hydrocodone also prescribed

## 2016-03-18 NOTE — Assessment & Plan Note (Signed)
Updated lab needed at/ before next visit. [name] is reminded of the importance of commitment to daily physical activity for 30 minutes or more, as able and the need to limit carbohydrate intake to 30 to 60 grams per meal to help with blood sugar control.   The need to take medication as prescribed, test blood sugar as directed, and to call between visits if there is a concern that blood sugar is uncontrolled is also discussed.   [name] is reminded of the importance of daily foot exam, annual eye examination, and good blood sugar, blood pressure and cholesterol control.  Diabetic Labs Latest Ref Rng & Units 10/02/2015 10/02/2015 09/13/2015 08/29/2015 07/20/2015  HbA1c <5.7 % 8.1(H) - - - -  Microalbumin Not estab mg/dL - - - - 1.0  Micro/Creat Ratio <30 mcg/mg creat - - - - 9  Chol 125 - 200 mg/dL - 146 - - -  HDL >=46 mg/dL - 43(L) - - -  Calc LDL <130 mg/dL - 84 - - -  Triglycerides <150 mg/dL - 93 - - -  Creatinine 0.50 - 1.10 mg/dL 0.89 0.92 0.88 1.00 -   BP/Weight 03/18/2016 10/10/2015 10/10/2015 09/14/2015 09/13/2015 08/29/2015 [id number]  Systolic BP 99991111 A999333 123XX123 123XX123 - - 123456  Diastolic BP 70 80 80 68 - - 82  Wt. (Lbs) 185 183 182 - 188.93 186 186.4  BMI 33.84 33.46 33.28 - 34.55 34.01 34.08   Foot/eye exam completion dates 07/12/2015 07/14/2014  Eye Exam - -  Foot exam Order - -  Foot Form Completion Done Done

## 2016-03-18 NOTE — Patient Instructions (Signed)
F/u as before, call if you need me sooner  Injections today and medication sent for acute back pain, call this week Thursday if no better as you will again need an [facility] that you feel better soon

## 2016-03-20 ENCOUNTER — Telehealth: Payer: Self-pay

## 2016-03-20 ENCOUNTER — Encounter: Payer: Self-pay | Admitting: Family Medicine

## 2016-03-20 ENCOUNTER — Other Ambulatory Visit: Payer: Self-pay | Admitting: Family Medicine

## 2016-03-20 DIAGNOSIS — M5432 Sciatica, left side: Secondary | ICD-10-CM

## 2016-03-20 NOTE — Telephone Encounter (Signed)
Patient states that she does not need Ativan.

## 2016-03-20 NOTE — Telephone Encounter (Signed)
Order entwered and I will send note to get it made to referral asap Pls ask if she needs ativan for study order per protocol 1 mg #2 only, I will sign, if needed

## 2016-03-25 ENCOUNTER — Other Ambulatory Visit: Payer: Self-pay | Admitting: Family Medicine

## 2016-03-25 ENCOUNTER — Other Ambulatory Visit: Payer: Self-pay | Admitting: "Endocrinology

## 2016-03-25 DIAGNOSIS — M5432 Sciatica, left side: Secondary | ICD-10-CM

## 2016-04-02 ENCOUNTER — Other Ambulatory Visit: Payer: Self-pay | Admitting: Family Medicine

## 2016-04-02 ENCOUNTER — Encounter: Payer: Self-pay | Admitting: Family Medicine

## 2016-04-02 ENCOUNTER — Ambulatory Visit (HOSPITAL_COMMUNITY)
Admission: RE | Admit: 2016-04-02 | Discharge: 2016-04-02 | Disposition: A | Discharge status: Home / Self Care | Payer: BLUE CROSS/BLUE SHIELD | Source: Ambulatory Visit | Attending: Family Medicine | Admitting: Family Medicine

## 2016-04-02 DIAGNOSIS — M5126 Other intervertebral disc displacement, lumbar region: Secondary | ICD-10-CM | POA: Diagnosis not present

## 2016-04-02 DIAGNOSIS — M5432 Sciatica, left side: Secondary | ICD-10-CM

## 2016-04-02 DIAGNOSIS — Z9889 Other specified postprocedural states: Secondary | ICD-10-CM | POA: Insufficient documentation

## 2016-04-02 DIAGNOSIS — M545 Low back pain: Secondary | ICD-10-CM | POA: Diagnosis present

## 2016-04-02 LAB — POCT I-STAT CREATININE: CREATININE: 0.8 mg/dL (ref 0.44–1.00)

## 2016-04-02 MED ORDER — GADOBENATE DIMEGLUMINE 529 MG/ML IV SOLN: 20.0000 mL | Freq: Once | INTRAVENOUS | Status: DC | PRN

## 2016-04-02 NOTE — Addendum Note (Signed)
Addended by: Denman George B on: 04/02/2016 10:04 AM   Modules accepted: Orders

## 2016-04-06 LAB — COMPLETE METABOLIC PANEL WITH GFR
ALT: 26 U/L (ref 6–29)
AST: 17 U/L (ref 10–30)
Albumin: 4.1 g/dL (ref 3.6–5.1)
Alkaline Phosphatase: 70 U/L (ref 33–115)
BUN: 11 mg/dL (ref 7–25)
CO2: 31 mmol/L (ref 20–31)
Calcium: 9.4 mg/dL (ref 8.6–10.2)
Chloride: 93 mmol/L — ABNORMAL LOW (ref 98–110)
Creat: 0.86 mg/dL (ref 0.50–1.10)
GFR, EST NON AFRICAN AMERICAN: 85 mL/min (ref >=60)
GFR, Est African American: >89 mL/min (ref >=60)
GLUCOSE: 299 mg/dL — AB (ref 65–99)
POTASSIUM: 3.5 mmol/L (ref 3.5–5.3)
SODIUM: 134 mmol/L — AB (ref 135–146)
TOTAL PROTEIN: 7 g/dL (ref 6.1–8.1)
Total Bilirubin: 0.5 mg/dL (ref 0.2–1.2)

## 2016-04-06 LAB — HEMOGLOBIN A1C
Hgb A1c MFr Bld: 9.7 % — ABNORMAL HIGH (ref <5.7)
MEAN PLASMA GLUCOSE: 232 mg/dL

## 2016-04-08 ENCOUNTER — Encounter: Payer: Self-pay | Admitting: Family Medicine

## 2016-04-08 ENCOUNTER — Ambulatory Visit (INDEPENDENT_AMBULATORY_CARE_PROVIDER_SITE_OTHER): Payer: BLUE CROSS/BLUE SHIELD | Admitting: Family Medicine

## 2016-04-08 VITALS — BP 118/82 | HR 75 | Resp 16 | Ht 62.0 in | Wt 179.0 lb

## 2016-04-08 DIAGNOSIS — E1169 Type 2 diabetes mellitus with other specified complication: Secondary | ICD-10-CM

## 2016-04-08 DIAGNOSIS — Z23 Encounter for immunization: Secondary | ICD-10-CM | POA: Diagnosis not present

## 2016-04-08 DIAGNOSIS — E119 Type 2 diabetes mellitus without complications: Secondary | ICD-10-CM | POA: Diagnosis not present

## 2016-04-08 DIAGNOSIS — E785 Hyperlipidemia, unspecified: Secondary | ICD-10-CM | POA: Diagnosis not present

## 2016-04-08 DIAGNOSIS — E669 Obesity, unspecified: Secondary | ICD-10-CM

## 2016-04-08 DIAGNOSIS — I10 Essential (primary) hypertension: Secondary | ICD-10-CM

## 2016-04-08 DIAGNOSIS — M5432 Sciatica, left side: Secondary | ICD-10-CM | POA: Diagnosis not present

## 2016-04-08 MED ORDER — GLIPIZIDE ER 10 MG PO TB24: 10.0000 mg | ORAL_TABLET | Freq: Every day | ORAL | 4 refills | Status: DC

## 2016-04-08 MED ORDER — HYDROCODONE-ACETAMINOPHEN 5-325 MG PO TABS: 1.0000 | ORAL_TABLET | Freq: Two times a day (BID) | ORAL | 0 refills | Status: DC | PRN

## 2016-04-08 NOTE — Assessment & Plan Note (Signed)
Controlled, no change in medication' Hyperlipidemia:Low fat diet discussed and encouraged.   Lipid Panel  Lab Results  Component Value Date   CHOL 146 10/02/2015   HDL 43 (L) 10/02/2015   LDLCALC 84 10/02/2015   TRIG 93 10/02/2015   CHOLHDL 3.4 10/02/2015

## 2016-04-08 NOTE — Assessment & Plan Note (Signed)
Improved. Patient re-educated about  the importance of commitment to a  minimum of 150 minutes of exercise per week.  The importance of healthy food choices with portion control discussed. Encouraged to start a food diary, count calories and to consider  joining a support group. Sample diet sheets offered. Goals set by the patient for the next several months.   Weight /BMI 04/08/2016 03/18/2016 10/10/2015  WEIGHT 179 lb 185 lb 183 lb  HEIGHT 5\' 2"  5\' 2"  5\' 2"   BMI 32.74 kg/m2 33.84 kg/m2 33.46 kg/m2

## 2016-04-08 NOTE — Assessment & Plan Note (Signed)
After obtaining informed consent, the vaccine is  administered by LPN.  

## 2016-04-08 NOTE — Assessment & Plan Note (Signed)
Uncontrolled and persistent, continue hydrocodone as before

## 2016-04-08 NOTE — Assessment & Plan Note (Signed)
Recurrent has appt inn Sept

## 2016-04-08 NOTE — Progress Notes (Signed)
JACKI LEBOURGEOIS     MRN: [id number]      DOB: Aug 05, 1976   HPI Ms. Dercole is here for follow up and re-evaluation of chronic medical conditions, medication management and review of any available recent lab and radiology data.  Preventive health is updated, specifically  Cancer screening and Immunization.   Questions or concerns regarding consultations or procedures which the PT has had in the interim are  addressed. The PT denies any adverse reactions to current medications since the last visit.  Ongoing and debilitating back and lower extremity pain Blood sugars are remaining elevated , fasting often over 180   ROS Denies recent fever or chills. Denies sinus pressure, nasal congestion, ear pain or sore throat. Denies chest congestion, productive cough or wheezing. Denies chest pains, palpitations and leg swelling Denies abdominal pain, nausea, vomiting,diarrhea or constipation.   Denies dysuria, frequency, hesitancy or incontinence.  Denies headaches, seizures, numbness, or tingling. Denies depression, anxiety or insomnia. Denies skin break down or rash.   PE  BP 118/82   Pulse 75   Resp 16   Ht 5\' 2"  (1.575 m)   Wt 179 lb (81.2 kg)   SpO2 98%   BMI 32.74 kg/m   Patient alert and oriented and in no cardiopulmonary distress.  HEENT: No facial asymmetry, EOMI,   oropharynx pink and moist.  Neck supple no JVD, no mass.  Chest: Clear to auscultation bilaterally.  CVS: S1, S2 no murmurs, no S3.Regular rate.  ABD: Soft non tender.   Ext: No edema  MS: decreased  ROM spine,normal in  shoulders, hips and knees.  Skin: Intact, no ulcerations or rash noted.  Psych: Good eye contact, normal affect. Memory intact not anxious or depressed appearing.  CNS: CN 2-12 intact,   Assessment & Plan  Diabetes mellitus type 2 in obese Uncontrolled, add gl;ipizide 10 mg daily Ms. Stanke is reminded of the importance of commitment to daily physical activity for 30 minutes  or more, as able and the need to limit carbohydrate intake to 30 to 60 grams per meal to help with blood sugar control.   The need to take medication as prescribed, test blood sugar as directed, and to call between visits if there is a concern that blood sugar is uncontrolled is also discussed.   Ms. Gelfand is reminded of the importance of daily foot exam, annual eye examination, and good blood sugar, blood pressure and cholesterol control.  Diabetic Labs Latest Ref Rng & Units 04/05/2016 04/02/2016 10/02/2015 10/02/2015 09/13/2015  HbA1c <5.7 % 9.7(H) - 8.1(H) - -  Microalbumin Not estab mg/dL - - - - -  Micro/Creat Ratio <30 mcg/mg creat - - - - -  Chol 125 - 200 mg/dL - - - 146 -  HDL >=46 mg/dL - - - 43(L) -  Calc LDL <130 mg/dL - - - 84 -  Triglycerides <150 mg/dL - - - 93 -  Creatinine 0.50 - 1.10 mg/dL 0.86 0.80 0.89 0.92 0.88   BP/Weight 04/08/2016 03/18/2016 10/10/2015 10/10/2015 09/14/2015 09/13/2015 [id number]  Systolic BP 123456 99991111 A999333 123XX123 123XX123 - -  Diastolic BP 82 70 80 80 68 - -  Wt. (Lbs) 179 185 183 182 - 188.93 186  BMI 32.74 33.84 33.46 33.28 - 34.55 34.01   Foot/eye exam completion dates 07/12/2015 07/14/2014  Eye Exam - -  Foot exam Order - -  Foot Form Completion Done Done        HNP (herniated nucleus pulposus), lumbar Recurrent  has appt inn Sept  Essential hypertension Improved. Patient re-educated about  the importance of commitment to a  minimum of 150 minutes of exercise per week.  The importance of healthy food choices with portion control discussed. Encouraged to start a food diary, count calories and to consider  joining a support group. Sample diet sheets offered. Goals set by the patient for the next several months.   Weight /BMI 04/08/2016 03/18/2016 10/10/2015  WEIGHT 179 lb 185 lb 183 lb  HEIGHT 5\' 2"  5\' 2"  5\' 2"   BMI 32.74 kg/m2 33.84 kg/m2 33.46 kg/m2      Hyperlipidemia Controlled, no change in medication' Hyperlipidemia:Low fat diet discussed and  encouraged.   Lipid Panel  Lab Results  Component Value Date   CHOL 146 10/02/2015   HDL 43 (L) 10/02/2015   LDLCALC 84 10/02/2015   TRIG 93 10/02/2015   CHOLHDL 3.4 10/02/2015       Back pain with left-sided sciatica Uncontrolled and persistent, continue hydrocodone as before  Need for prophylactic vaccination and inoculation against influenza After obtaining informed consent, the vaccine is  administered by LPN.

## 2016-04-08 NOTE — Assessment & Plan Note (Signed)
Uncontrolled, add gl;ipizide 10 mg daily [name] is reminded of the importance of commitment to daily physical activity for 30 minutes or more, as able and the need to limit carbohydrate intake to 30 to 60 grams per meal to help with blood sugar control.   The need to take medication as prescribed, test blood sugar as directed, and to call between visits if there is a concern that blood sugar is uncontrolled is also discussed.   [name] is reminded of the importance of daily foot exam, annual eye examination, and good blood sugar, blood pressure and cholesterol control.  Diabetic Labs Latest Ref Rng & Units 04/05/2016 04/02/2016 10/02/2015 10/02/2015 09/13/2015  HbA1c <5.7 % 9.7(H) - 8.1(H) - -  Microalbumin Not estab mg/dL - - - - -  Micro/Creat Ratio <30 mcg/mg creat - - - - -  Chol 125 - 200 mg/dL - - - 146 -  HDL >=46 mg/dL - - - 43(L) -  Calc LDL <130 mg/dL - - - 84 -  Triglycerides <150 mg/dL - - - 93 -  Creatinine 0.50 - 1.10 mg/dL 0.86 0.80 0.89 0.92 0.88   BP/Weight 04/08/2016 03/18/2016 10/10/2015 10/10/2015 09/14/2015 09/13/2015 [id number]  Systolic BP 123456 99991111 A999333 123XX123 123XX123 - -  Diastolic BP 82 70 80 80 68 - -  Wt. (Lbs) 179 185 183 182 - 188.93 186  BMI 32.74 33.84 33.46 33.28 - 34.55 34.01   Foot/eye exam completion dates 07/12/2015 07/14/2014  Eye Exam - -  Foot exam Order - -  Foot Form Completion Done Done

## 2016-04-08 NOTE — Patient Instructions (Addendum)
F/u in 3 month, call if you need me before  Follow special diabetic eating plan  Blood sugar has gotten worse, add glipizide 10 mg every morning with breakfast, comntinue janumet as before  Check twice daily and record, morning blood sugar , fasting should be between 80 to 120, bedtime , between 130 to 180  Call nurse weekly with blood sugar update, I may need to increase medication dose  Only sugar is fruit, fresh or frozen  Two week supply of hydrocodoen today Keep appt with surgeon    Flu vaccine today Fasting labs in 3 month      Chair exercise 10 mins 3 times daily after meals

## 2016-04-12 ENCOUNTER — Telehealth: Payer: Self-pay

## 2016-04-12 NOTE — Telephone Encounter (Signed)
agree

## 2016-04-12 NOTE — Telephone Encounter (Signed)
Fasting- [phone] Bedtime- [phone]  Advised readings looked a lot better and to keep up the good work. Reminded her that if bedtime reading was below 130 to eat a small carb snack and gave examples. Will call back next week

## 2016-04-18 ENCOUNTER — Other Ambulatory Visit: Payer: Self-pay | Admitting: Neurosurgery

## 2016-04-23 ENCOUNTER — Other Ambulatory Visit: Payer: Self-pay | Admitting: Neurosurgery

## 2016-04-24 ENCOUNTER — Inpatient Hospital Stay (HOSPITAL_COMMUNITY): Admission: RE | Admit: 2016-04-24 | Payer: BLUE CROSS/BLUE SHIELD | Source: Ambulatory Visit

## 2016-04-26 ENCOUNTER — Encounter (HOSPITAL_COMMUNITY): Admission: RE | Payer: Self-pay | Source: Ambulatory Visit

## 2016-04-26 ENCOUNTER — Inpatient Hospital Stay (HOSPITAL_COMMUNITY): Admission: RE | Admit: 2016-04-26 | Payer: BLUE CROSS/BLUE SHIELD | Source: Ambulatory Visit | Admitting: Neurosurgery

## 2016-04-26 SURGERY — POSTERIOR LUMBAR FUSION 1 LEVEL
Anesthesia: General | Laterality: Left

## 2016-07-09 ENCOUNTER — Ambulatory Visit: Payer: BLUE CROSS/BLUE SHIELD | Admitting: Family Medicine

## 2016-09-02 ENCOUNTER — Other Ambulatory Visit: Payer: Self-pay | Admitting: Family Medicine

## 2016-09-19 LAB — HM DIABETES EYE EXAM

## 2016-09-24 ENCOUNTER — Encounter: Payer: Self-pay | Admitting: Family Medicine

## 2016-10-04 ENCOUNTER — Other Ambulatory Visit: Payer: Self-pay | Admitting: Family Medicine

## 2016-10-11 ENCOUNTER — Telehealth: Payer: Self-pay

## 2016-10-11 DIAGNOSIS — I10 Essential (primary) hypertension: Secondary | ICD-10-CM

## 2016-10-11 DIAGNOSIS — E785 Hyperlipidemia, unspecified: Secondary | ICD-10-CM

## 2016-10-11 DIAGNOSIS — E1169 Type 2 diabetes mellitus with other specified complication: Secondary | ICD-10-CM

## 2016-10-11 DIAGNOSIS — E669 Obesity, unspecified: Secondary | ICD-10-CM

## 2016-10-11 LAB — LIPID PANEL
Cholesterol: 122 mg/dL (ref <200)
HDL: 35 mg/dL — AB (ref >50)
LDL CALC: 74 mg/dL (ref <100)
TRIGLYCERIDES: 65 mg/dL (ref <150)
Total CHOL/HDL Ratio: 3.5 Ratio (ref <5.0)
VLDL: 13 mg/dL (ref <30)

## 2016-10-11 LAB — COMPLETE METABOLIC PANEL WITH GFR
ALT: 14 U/L (ref 6–29)
AST: 14 U/L (ref 10–30)
Albumin: 3.8 g/dL (ref 3.6–5.1)
Alkaline Phosphatase: 63 U/L (ref 33–115)
BUN: 13 mg/dL (ref 7–25)
CALCIUM: 9.1 mg/dL (ref 8.6–10.2)
CHLORIDE: 98 mmol/L (ref 98–110)
CO2: 30 mmol/L (ref 20–31)
Creat: 0.96 mg/dL (ref 0.50–1.10)
GFR, Est African American: 86 mL/min (ref >=60)
GFR, Est Non African American: 75 mL/min (ref >=60)
Glucose, Bld: 170 mg/dL — ABNORMAL HIGH (ref 65–99)
POTASSIUM: 4.1 mmol/L (ref 3.5–5.3)
SODIUM: 139 mmol/L (ref 135–146)
Total Bilirubin: 0.4 mg/dL (ref 0.2–1.2)
Total Protein: 6.6 g/dL (ref 6.1–8.1)

## 2016-10-11 NOTE — Telephone Encounter (Signed)
Labs ordered.

## 2016-10-12 LAB — HEMOGLOBIN A1C
Hgb A1c MFr Bld: 8.1 % — ABNORMAL HIGH (ref <5.7)
Mean Plasma Glucose: 186 mg/dL

## 2016-10-15 ENCOUNTER — Ambulatory Visit (INDEPENDENT_AMBULATORY_CARE_PROVIDER_SITE_OTHER): Payer: BLUE CROSS/BLUE SHIELD | Admitting: Family Medicine

## 2016-10-15 VITALS — BP 120/84 | HR 91 | Resp 15 | Ht 62.0 in | Wt 182.1 lb

## 2016-10-15 DIAGNOSIS — IMO0001 Reserved for inherently not codable concepts without codable children: Secondary | ICD-10-CM

## 2016-10-15 DIAGNOSIS — Z23 Encounter for immunization: Secondary | ICD-10-CM | POA: Diagnosis not present

## 2016-10-15 DIAGNOSIS — M5432 Sciatica, left side: Secondary | ICD-10-CM

## 2016-10-15 DIAGNOSIS — E785 Hyperlipidemia, unspecified: Secondary | ICD-10-CM

## 2016-10-15 DIAGNOSIS — E669 Obesity, unspecified: Secondary | ICD-10-CM | POA: Diagnosis not present

## 2016-10-15 DIAGNOSIS — E1169 Type 2 diabetes mellitus with other specified complication: Secondary | ICD-10-CM | POA: Diagnosis not present

## 2016-10-15 DIAGNOSIS — I10 Essential (primary) hypertension: Secondary | ICD-10-CM | POA: Diagnosis not present

## 2016-10-15 NOTE — Patient Instructions (Signed)
F/u in 6 month, call if you ned me before  Pneumonia 23 vaccine today  Improved blood sugar but needs to get better, keep appt with Dr Dorris Fetch  pls commit to daily exercise for 30 minutes so good cholesterol improvs  Please work on good  health habits so that your health will improve. 1. Commitment to daily physical activity for 30 to 60  minutes, if you are able to do this.  2. Commitment to wise food choices. Aim for half of your  food intake to be vegetable and fruit, one quarter starchy foods, and one quarter protein. Try to eat on a regular schedule  3 meals per day, snacking between meals should be limited to vegetables or fruits or small portions of nuts. 64 ounces of water per day is generally recommended, unless you have specific health conditions, like heart failure or kidney failure where you will need to limit fluid intake.  3. Commitment to sufficient and a  good quality of physical and mental rest daily, generally between 6 to 8 hours per day.  WITH PERSISTANCE AND PERSEVERANCE, THE IMPOSSIBLE , BECOMES THE NORM! Thank you  for choosing [facility]. We consider it a privelige to serve you.  Delivering excellent health care in a caring and  compassionate way is our goal.  Partnering with you,  so that together we can achieve this goal is our strategy.

## 2016-10-16 ENCOUNTER — Other Ambulatory Visit (HOSPITAL_COMMUNITY)
Admission: RE | Admit: 2016-10-16 | Discharge: 2016-10-16 | Disposition: A | Discharge status: Home / Self Care | Payer: BLUE CROSS/BLUE SHIELD | Source: Other Acute Inpatient Hospital | Attending: Family Medicine | Admitting: Family Medicine

## 2016-10-16 DIAGNOSIS — E669 Obesity, unspecified: Secondary | ICD-10-CM | POA: Diagnosis present

## 2016-10-16 DIAGNOSIS — E1169 Type 2 diabetes mellitus with other specified complication: Secondary | ICD-10-CM | POA: Insufficient documentation

## 2016-10-16 MED ORDER — PRAVASTATIN SODIUM 10 MG PO TABS: 10.0000 mg | ORAL_TABLET | Freq: Every day | ORAL | 1 refills | Status: DC

## 2016-10-16 MED ORDER — SITAGLIPTIN PHOS-METFORMIN HCL 50-1000 MG PO TABS: 1.0000 | ORAL_TABLET | Freq: Two times a day (BID) | ORAL | 5 refills | Status: DC

## 2016-10-16 MED ORDER — PROPRANOLOL HCL 40 MG PO TABS: 40.0000 mg | ORAL_TABLET | Freq: Two times a day (BID) | ORAL | 1 refills | Status: DC

## 2016-10-17 ENCOUNTER — Encounter: Payer: Self-pay | Admitting: Family Medicine

## 2016-10-17 LAB — MICROALBUMIN / CREATININE URINE RATIO
CREATININE, UR: 152.7 mg/dL
MICROALB UR: 35.2 ug/mL — AB
MICROALB/CREAT RATIO: 23.1 mg/g{creat} (ref 0.0–30.0)

## 2016-10-17 NOTE — Assessment & Plan Note (Signed)
After obtaining informed consent, the vaccine is  administered by LPN.  

## 2016-10-17 NOTE — Assessment & Plan Note (Signed)
[name] is reminded of the importance of commitment to daily physical activity for 30 minutes or more, as able and the need to limit carbohydrate intake to 30 to 60 grams per meal to help with blood sugar control.   The need to take medication as prescribed, test blood sugar as directed, and to call between visits if there is a concern that blood sugar is uncontrolled is also discussed.   [name] is reminded of the importance of daily foot exam, annual eye examination, and good blood sugar, blood pressure and cholesterol control.  Improved but not at goal, refer endo Diabetic Labs Latest Ref Rng & Units 10/11/2016 04/05/2016 04/02/2016 10/02/2015 10/02/2015  HbA1c <5.7 % 8.1(H) 9.7(H) - 8.1(H) -  Microalbumin Not estab mg/dL - - - - -  Micro/Creat Ratio <30 mcg/mg creat - - - - -  Chol <200 mg/dL 122 - - - 146  HDL >50 mg/dL 35(L) - - - 43(L)  Calc LDL <100 mg/dL 74 - - - 84  Triglycerides <150 mg/dL 65 - - - 93  Creatinine 0.50 - 1.10 mg/dL 0.96 0.86 0.80 0.89 0.92   BP/Weight 10/15/2016 04/08/2016 03/18/2016 10/10/2015 10/10/2015 03/18/7543 04/13/99  Systolic BP 712 197 588 325 498 264 -  Diastolic BP 84 82 70 80 80 68 -  Wt. (Lbs) 182.12 179 185 183 182 - 188.93  BMI 33.31 32.74 33.84 33.46 33.28 - 34.55   Foot/eye exam completion dates Latest Ref Rng & Units 10/15/2016 09/19/2016  Eye Exam No Retinopathy - No Retinopathy  Foot exam Order - - -  Foot Form Completion - Done -

## 2016-10-17 NOTE — Progress Notes (Signed)
[name]     MRN: [id number]      DOB: 05/21/1976   HPI [name] is here for follow up and re-evaluation of chronic medical conditions, medication management and review of any available recent lab and radiology data.  Preventive health is updated, specifically  Cancer screening and Immunization.   Questions or concerns regarding consultations or procedures which the PT has had in the interim are  Addressed. Requests return to endo for diabetes as she now has  Medicaid and blood sugar  Is uncontrolled Denies polyuria, polydipsia, blurred vision , or hypoglycemic episodes.  The PT denies any adverse reactions to current medications since the last visit.    ROS Denies recent fever or chills. Denies sinus pressure, nasal congestion, ear pain or sore throat. Denies chest congestion, productive cough or wheezing. Denies chest pains, palpitations and leg swelling Denies abdominal pain, nausea, vomiting,diarrhea or constipation.   Denies dysuria, frequency, hesitancy or incontinence. Denies joint pain, swelling and limitation in mobility. Denies headaches, seizures, numbness, or tingling. Denies depression, anxiety or insomnia. Denies skin break down or rash.   PE  BP 120/84   Pulse 91   Resp 15   Ht 5\' 2"  (1.575 m)   Wt 182 lb 1.9 oz (82.6 kg)   SpO2 98%   BMI 33.31 kg/m   Patient alert and oriented and in no cardiopulmonary distress.  HEENT: No facial asymmetry, EOMI,   oropharynx pink and moist.  Neck supple no JVD, no mass.  Chest: Clear to auscultation bilaterally.  CVS: S1, S2 no murmurs, no S3.Regular rate.  ABD: Soft non tender.   Ext: No edema  MS: Adequate ROM spine, shoulders, hips and knees.  Skin: Intact, no ulcerations or rash noted.  Psych: Good eye contact, normal affect. Memory intact not anxious or depressed appearing.  CNS: CN 2-12 intact, power,  normal throughout.no focal deficits noted.   Assessment & Plan Essential  hypertension Controlled, no change in medication DASH diet and commitment to daily physical activity for a minimum of 30 minutes discussed and encouraged, as a part of hypertension management. The importance of attaining a healthy weight is also discussed.  BP/Weight 10/15/2016 04/08/2016 03/18/2016 10/10/2015 10/10/2015 8/0/3212 09/15/8248  Systolic BP 037 048 889 169 450 388 -  Diastolic BP 84 82 70 80 80 68 -  Wt. (Lbs) 182.12 179 185 183 182 - 188.93  BMI 33.31 32.74 33.84 33.46 33.28 - 34.55       Hyperlipidemia LDL goal <100 Hyperlipidemia:Low fat diet discussed and encouraged.   Lipid Panel  Lab Results  Component Value Date   CHOL 122 10/11/2016   HDL 35 (L) 10/11/2016   LDLCALC 74 10/11/2016   TRIG 65 10/11/2016   CHOLHDL 3.5 10/11/2016   Needs to increase exercise   Obesity, Class II, BMI 35-39.9, with comorbidity Deteriorated. Patient re-educated about  the importance of commitment to a  minimum of 150 minutes of exercise per week.  The importance of healthy food choices with portion control discussed. Encouraged to start a food diary, count calories and to consider  joining a support group. Sample diet sheets offered. Goals set by the patient for the next several months.   Weight /BMI 10/15/2016 04/08/2016 03/18/2016  WEIGHT 182 lb 1.9 oz 179 lb 185 lb  HEIGHT 5\' 2"  5\' 2"  5\' 2"   BMI 33.31 kg/m2 32.74 kg/m2 33.84 kg/m2      Need for 23-polyvalent pneumococcal polysaccharide vaccine After obtaining informed consent, the vaccine  is  administered by LPN.   Diabetes mellitus type 2 in obese [name] is reminded of the importance of commitment to daily physical activity for 30 minutes or more, as able and the need to limit carbohydrate intake to 30 to 60 grams per meal to help with blood sugar control.   The need to take medication as prescribed, test blood sugar as directed, and to call between visits if there is a concern that blood sugar is uncontrolled is also  discussed.   [name] is reminded of the importance of daily foot exam, annual eye examination, and good blood sugar, blood pressure and cholesterol control.  Improved but not at goal, refer endo Diabetic Labs Latest Ref Rng & Units 10/11/2016 04/05/2016 04/02/2016 10/02/2015 10/02/2015  HbA1c <5.7 % 8.1(H) 9.7(H) - 8.1(H) -  Microalbumin Not estab mg/dL - - - - -  Micro/Creat Ratio <30 mcg/mg creat - - - - -  Chol <200 mg/dL 122 - - - 146  HDL >50 mg/dL 35(L) - - - 43(L)  Calc LDL <100 mg/dL 74 - - - 84  Triglycerides <150 mg/dL 65 - - - 93  Creatinine 0.50 - 1.10 mg/dL 0.96 0.86 0.80 0.89 0.92   BP/Weight 10/15/2016 04/08/2016 03/18/2016 10/10/2015 10/10/2015 02/17/382 10/13/8327  Systolic BP 191 660 600 459 977 414 -  Diastolic BP 84 82 70 80 80 68 -  Wt. (Lbs) 182.12 179 185 183 182 - 188.93  BMI 33.31 32.74 33.84 33.46 33.28 - 34.55   Foot/eye exam completion dates Latest Ref Rng & Units 10/15/2016 09/19/2016  Eye Exam No Retinopathy - No Retinopathy  Foot exam Order - - -  Foot Form Completion - Done -        Back pain with left-sided sciatica Resolved since last visit without surgery

## 2016-10-17 NOTE — Assessment & Plan Note (Signed)
Deteriorated. Patient re-educated about  the importance of commitment to a  minimum of 150 minutes of exercise per week.  The importance of healthy food choices with portion control discussed. Encouraged to start a food diary, count calories and to consider  joining a support group. Sample diet sheets offered. Goals set by the patient for the next several months.   Weight /BMI 10/15/2016 04/08/2016 03/18/2016  WEIGHT 182 lb 1.9 oz 179 lb 185 lb  HEIGHT 5\' 2"  5\' 2"  5\' 2"   BMI 33.31 kg/m2 32.74 kg/m2 33.84 kg/m2

## 2016-10-17 NOTE — Assessment & Plan Note (Signed)
Controlled, no change in medication DASH diet and commitment to daily physical activity for a minimum of 30 minutes discussed and encouraged, as a part of hypertension management. The importance of attaining a healthy weight is also discussed.  BP/Weight 10/15/2016 04/08/2016 03/18/2016 10/10/2015 10/10/2015 10/13/3830 04/12/9165  Systolic BP [phone] -  Diastolic BP 84 82 70 80 80 68 -  Wt. (Lbs) 182.12 179 185 183 182 - 188.93  BMI 33.31 32.74 33.84 33.46 33.28 - 34.55

## 2016-10-17 NOTE — Assessment & Plan Note (Signed)
Hyperlipidemia:Low fat diet discussed and encouraged.   Lipid Panel  Lab Results  Component Value Date   CHOL 122 10/11/2016   HDL 35 (L) 10/11/2016   LDLCALC 74 10/11/2016   TRIG 65 10/11/2016   CHOLHDL 3.5 10/11/2016   Needs to increase exercise

## 2016-10-17 NOTE — Assessment & Plan Note (Signed)
Resolved since last visit without surgery

## 2016-10-18 ENCOUNTER — Other Ambulatory Visit: Payer: Self-pay | Admitting: Family Medicine

## 2016-10-18 ENCOUNTER — Telehealth: Payer: Self-pay

## 2016-10-18 ENCOUNTER — Other Ambulatory Visit: Payer: Self-pay

## 2016-10-18 MED ORDER — OSELTAMIVIR PHOSPHATE 75 MG PO CAPS: 75.0000 mg | ORAL_CAPSULE | Freq: Two times a day (BID) | ORAL | 0 refills | Status: DC

## 2016-10-18 NOTE — Telephone Encounter (Signed)
Patient aware.

## 2016-10-18 NOTE — Telephone Encounter (Signed)
Started yesterday, aching all over, chills, fever, cough, sore throat, sinus drainage and feels awful. Can tamiflu be sent in for her?

## 2016-10-18 NOTE — Telephone Encounter (Signed)
I have sent tamiflu to [facility], pls let her know also review need for tylenol and rest, ED if worsens

## 2016-10-22 ENCOUNTER — Encounter: Payer: Self-pay | Admitting: "Endocrinology

## 2016-10-22 ENCOUNTER — Ambulatory Visit (INDEPENDENT_AMBULATORY_CARE_PROVIDER_SITE_OTHER): Payer: BLUE CROSS/BLUE SHIELD | Admitting: "Endocrinology

## 2016-10-22 VITALS — BP 120/79 | HR 84 | Ht 62.0 in | Wt 179.0 lb

## 2016-10-22 DIAGNOSIS — E669 Obesity, unspecified: Secondary | ICD-10-CM | POA: Diagnosis not present

## 2016-10-22 DIAGNOSIS — E1169 Type 2 diabetes mellitus with other specified complication: Secondary | ICD-10-CM

## 2016-10-22 DIAGNOSIS — E782 Mixed hyperlipidemia: Secondary | ICD-10-CM | POA: Diagnosis not present

## 2016-10-22 DIAGNOSIS — I10 Essential (primary) hypertension: Secondary | ICD-10-CM

## 2016-10-22 MED ORDER — DAPAGLIFLOZIN PROPANEDIOL 5 MG PO TABS: 5.0000 mg | ORAL_TABLET | Freq: Every day | ORAL | 3 refills | Status: DC

## 2016-10-22 NOTE — Progress Notes (Signed)
Subjective:    Patient ID: [name], female    DOB: September 23, 1975, PCP [name], MD   Past Medical History:  Diagnosis Date  . Constipation   . GERD (gastroesophageal reflux disease)   . Hypertension   . Morbid obesity ([place])   . NIDDM (non-insulin dependent diabetes mellitus) 2005   Type II   Past Surgical History:  Procedure Laterality Date  . BACK SURGERY  05/25/2010   Dr,Kabell   . CESAREAN SECTION  6/09, 2015  . LUMBAR LAMINECTOMY/DECOMPRESSION MICRODISCECTOMY Left 09/13/2015   Procedure: Left Lumbar five-Sacral one microdiskectomy;  Surgeon: Ashok Pall, MD;  Location: [facility];  Service: Neurosurgery;  Laterality: Left;  Left Lumbar five-Sacral one microdiskectomy   Social History   Social History  . Marital status: Married    Spouse name: N/A  . Number of children: N/A  . Years of education: N/A   Occupational History  . unemployed     Social History Main Topics  . Smoking status: Never Smoker  . Smokeless tobacco: Never Used  . Alcohol use No  . Drug use: No  . Sexual activity: Yes    Birth control/ protection: None   Other Topics Concern  . None   Social History Narrative  . None   Outpatient Encounter Prescriptions as of 10/22/2016  Medication Sig  . dapagliflozin propanediol (FARXIGA) 5 MG TABS tablet Take 5 mg by mouth daily.  . hydrochlorothiazide (HYDRODIURIL) 25 MG tablet TAKE 1 TABLET BY MOUTH DAILY EMERGENCY REFILL FAXED DR  . oseltamivir (TAMIFLU) 75 MG capsule Take 1 capsule (75 mg total) by mouth 2 (two) times daily.  . pravastatin (PRAVACHOL) 10 MG tablet Take 1 tablet (10 mg total) by mouth at bedtime.  . propranolol (INDERAL) 40 MG tablet Take 1 tablet (40 mg total) by mouth 2 (two) times daily.  . sitaGLIPtin-metformin (JANUMET) 50-1000 MG tablet Take 1 tablet by mouth 2 (two) times daily.  . [DISCONTINUED] glipiZIDE (GLUCOTROL XL) 10 MG 24 hr tablet TAKE 1 TABLET BY MOUTH DAILY WITH BREAKFAST   No  facility-administered encounter medications on file as of 10/22/2016.    ALLERGIES: Allergies  Allergen Reactions  . Ace Inhibitors Cough  . Amlodipine Besylate     REACTION: severe edema of legs   VACCINATION STATUS: Immunization History  Administered Date(s) Administered  . Influenza Split 05/20/2011, 05/17/2014  . Influenza Whole 06/09/2006, 05/02/2008, 03/06/2010  . Influenza,inj,Quad PF,36+ Mos 05/17/2015, 04/08/2016  . Pneumococcal Polysaccharide-23 08/14/2009, 10/15/2016  . Td 12/05/2009    Diabetes  She presents for her follow-up diabetic visit. She has type 2 diabetes mellitus. Onset time: She was diagnosed at age of 26 years. Her disease course has been stable. There are no hypoglycemic associated symptoms. Pertinent negatives for hypoglycemia include no confusion, headaches, pallor or seizures. Pertinent negatives for diabetes include no blurred vision, no chest pain, no fatigue, no polydipsia, no polyphagia and no polyuria. There are no hypoglycemic complications. Symptoms are stable. There are no diabetic complications. Risk factors for coronary artery disease include diabetes mellitus, dyslipidemia, hypertension, obesity and sedentary lifestyle. Current diabetic treatment includes oral agent (dual therapy). She is compliant with treatment most of the time. Her weight is decreasing steadily. She is following a generally unhealthy diet. She has had a previous visit with a dietitian. She never participates in exercise. Home blood sugar record trend: Her A1c increased from 8.4% to 12 .1%. (She came with no meter nor logs to review) An ACE inhibitor/angiotensin II  receptor blocker is contraindicated.  Hyperlipidemia  This is a chronic problem. The current episode started more than 1 year ago. Pertinent negatives include no chest pain, myalgias or shortness of breath. Current antihyperlipidemic treatment includes statins. Risk factors for coronary artery disease include dyslipidemia,  diabetes mellitus, hypertension, obesity and a sedentary lifestyle.  Hypertension  This is a chronic problem. The current episode started more than 1 year ago. Pertinent negatives include no blurred vision, chest pain, headaches, palpitations or shortness of breath. Past treatments include diuretics. The current treatment provides moderate improvement.     Review of Systems  Constitutional: Negative for fatigue and unexpected weight change.  HENT: Negative for trouble swallowing and voice change.   Eyes: Negative for blurred vision and visual disturbance.  Respiratory: Negative for cough, shortness of breath and wheezing.   Cardiovascular: Negative for chest pain, palpitations and leg swelling.  Gastrointestinal: Negative for diarrhea, nausea and vomiting.  Endocrine: Negative for cold intolerance, heat intolerance, polydipsia, polyphagia and polyuria.  Musculoskeletal: Negative for arthralgias and myalgias.  Skin: Negative for color change, pallor, rash and wound.  Neurological: Negative for seizures and headaches.  Psychiatric/Behavioral: Negative for confusion and suicidal ideas.    Objective:    BP 120/79   Pulse 84   Ht 5\' 2"  (1.575 m)   Wt 179 lb (81.2 kg)   BMI 32.74 kg/m   Wt Readings from Last 3 Encounters:  10/22/16 179 lb (81.2 kg)  10/15/16 182 lb 1.9 oz (82.6 kg)  04/08/16 179 lb (81.2 kg)    Physical Exam  Constitutional: She is oriented to person, place, and time. She appears well-developed.  HENT:  Head: Normocephalic and atraumatic.  Eyes: EOM are normal.  Neck: Normal range of motion. Neck supple. No tracheal deviation present. No thyromegaly present.  Cardiovascular: Normal rate and regular rhythm.   Pulmonary/Chest: Effort normal and breath sounds normal.  Abdominal: Soft. Bowel sounds are normal. There is no tenderness. There is no guarding.  Musculoskeletal: Normal range of motion. She exhibits no edema.  Neurological: She is alert and oriented to  person, place, and time. She has normal reflexes. No cranial nerve deficit. Coordination normal.  Skin: Skin is warm and dry. No rash noted. No erythema. No pallor.  Psychiatric: She has a normal mood and affect. Judgment normal.    Results for orders placed or performed during the hospital encounter of 10/16/16  Microalbumin / creatinine urine ratio  Result Value Ref Range   Microalb, Ur 35.2 (H) Not Estab. ug/mL   Microalb Creat Ratio 23.1 0.0 - 30.0 mg/g creat   Creatinine, Urine 152.7 Not Estab. mg/dL   Diabetic Labs (most recent): Lab Results  Component Value Date   HGBA1C 8.1 (H) 10/11/2016   HGBA1C 9.7 (H) 04/05/2016   HGBA1C 8.1 (H) 10/02/2015   Lipid Panel     Component Value Date/Time   CHOL 122 10/11/2016 0758   TRIG 65 10/11/2016 0758   HDL 35 (L) 10/11/2016 0758   CHOLHDL 3.5 10/11/2016 0758   VLDL 13 10/11/2016 0758   LDLCALC 74 10/11/2016 0758     Assessment & Plan:   1. Diabetes mellitus type 2 in obese [facility])  - patient remains at a high risk for more acute and chronic complications of diabetes which include CAD, CVA, CKD, retinopathy, and neuropathy. These are all discussed in detail with the patient.  Patient came with controlled fasting and bedtime blood glucose readings. Her A1c has Stated at 8.1%, slowly improving  from  12.1%. - She did not stay on her Lantus since last visit, due to insurance problems. And she was initiated on Glucotrol 10 mg by mouth daily by her PMD.   Recent labs reviewed.  - I have re-counseled the patient on diet management and weight loss  by adopting a carbohydrate restricted / protein rich  Diet.  - Suggestion is made for patient to avoid simple carbohydrates   from their diet including Cakes , Desserts, Ice Cream,  Soda (  diet and regular) , Sweet Tea , Candies,  Chips, Cookies, Artificial Sweeteners,   and "Sugar-free" Products .  This will help patient to have stable blood glucose profile and potentially avoid unintended   Weight gain.  - Patient is advised to stick to a routine mealtimes to eat 3 meals  a day and avoid unnecessary snacks ( to snack only to correct hypoglycemia).  - The patient  has been  scheduled with Jearld Fenton, RDN, CDE for individualized DM education.  - I have approached patient with the following individualized plan to manage diabetes and patient agrees.  - I will continue Janumet 50/1000mg  po BID, therapeutically suitable for patient. - I will add Farxiga 5 mg by mouth daily. Side effects and precautions discussed with her. -If she cannot control diabetes  to target she will be re-considered for at least a basal insulin next visit .  -Patient is encouraged to call clinic for blood glucose levels less than 70 or above 300 mg /dl. - Patient specific target  for A1c; LDL, HDL, Triglycerides, and  Waist Circumference were discussed in detail.  2) BP/HTN: Controlled. Continue current medications. 3) Lipids/HPL:  continue statins.  4)  Weight/Diet: CDE consult in progress, exercise, and carbohydrates information provided.  5) Vitamin D deficiency -I  advised her to continue vitamin D 50,000 units weekly for the next 12 weeks.  6) Chronic Care/Health Maintenance:  -Patient is on a Statin medications (allergic to ACE inhibitors) and encouraged to continue to follow up with Ophthalmology, Podiatrist at least yearly or according to recommendations, and advised to  stay away from smoking. I have recommended yearly flu vaccine and pneumonia vaccination at least every 5 years; moderate intensity exercise for up to 150 minutes weekly; and  sleep for at least 7 hours a day.  - 25 minutes of time was spent on the care of this patient , 50% of which was applied for counseling on diabetes complications and their preventions.  - I advised patient to maintain close follow up with [name], MD for primary care needs.  Patient is asked to bring meter and  blood glucose logs during their  next visit.   Follow up plan: -Return in about 3 months (around 01/22/2017) for follow up with pre-visit labs.  Glade Lloyd, MD Phone: [phone]  Fax: [phone]   10/22/2016, 4:01 PM

## 2016-10-28 ENCOUNTER — Telehealth: Payer: Self-pay | Admitting: *Deleted

## 2016-10-28 NOTE — Telephone Encounter (Signed)
Advise patient to increase her Farxiga to 10 mg ( 2 pills of 5 mg) , call back if readings are still above 300x 3.

## 2016-10-28 NOTE — Telephone Encounter (Signed)
Pt states she has had high BG readings.   Date Before breakfast Before lunch Before supper Bedtime  3/17 350     3/18 275     3/19 272             Pt taking: Janumet 50-1000mg  bid, Farxiga 5mg  qd

## 2016-10-28 NOTE — Telephone Encounter (Signed)
Patient called stating all of her fasting readings since the last time she was in the office about 2 or 3 weeks ago her readings are in the 300's, patient said she does not feel like the medication she is taking is helping anymore. [phone]

## 2016-10-28 NOTE — Telephone Encounter (Signed)
Pt.notified

## 2016-10-30 LAB — HM PAP SMEAR: HM Pap smear: NEGATIVE

## 2016-11-11 ENCOUNTER — Other Ambulatory Visit: Payer: Self-pay

## 2016-11-11 MED ORDER — DAPAGLIFLOZIN PROPANEDIOL 5 MG PO TABS: 10.0000 mg | ORAL_TABLET | Freq: Every day | ORAL | 3 refills | Status: DC

## 2016-11-28 ENCOUNTER — Encounter: Payer: Self-pay | Admitting: "Endocrinology

## 2016-11-28 NOTE — Telephone Encounter (Signed)
Pt.notified

## 2016-12-11 ENCOUNTER — Other Ambulatory Visit: Payer: Self-pay | Admitting: Family Medicine

## 2016-12-31 ENCOUNTER — Other Ambulatory Visit: Payer: Self-pay

## 2016-12-31 MED ORDER — HYDROCHLOROTHIAZIDE 25 MG PO TABS: ORAL_TABLET | ORAL | 1 refills | Status: DC

## 2017-01-14 ENCOUNTER — Other Ambulatory Visit: Payer: Self-pay | Admitting: "Endocrinology

## 2017-01-15 LAB — COMPREHENSIVE METABOLIC PANEL
ALBUMIN: 4 g/dL (ref 3.6–5.1)
ALK PHOS: 69 U/L (ref 33–115)
ALT: 20 U/L (ref 6–29)
AST: 15 U/L (ref 10–30)
BUN: 11 mg/dL (ref 7–25)
CHLORIDE: 94 mmol/L — AB (ref 98–110)
CO2: 25 mmol/L (ref 20–31)
CREATININE: 0.97 mg/dL (ref 0.50–1.10)
Calcium: 9.4 mg/dL (ref 8.6–10.2)
Glucose, Bld: 322 mg/dL — ABNORMAL HIGH (ref 65–99)
POTASSIUM: 4 mmol/L (ref 3.5–5.3)
Sodium: 136 mmol/L (ref 135–146)
TOTAL PROTEIN: 7 g/dL (ref 6.1–8.1)
Total Bilirubin: 0.3 mg/dL (ref 0.2–1.2)

## 2017-01-15 LAB — HEMOGLOBIN A1C
HEMOGLOBIN A1C: 11.2 % — AB (ref <5.7)
MEAN PLASMA GLUCOSE: 275 mg/dL

## 2017-01-24 ENCOUNTER — Ambulatory Visit (INDEPENDENT_AMBULATORY_CARE_PROVIDER_SITE_OTHER): Payer: BLUE CROSS/BLUE SHIELD | Admitting: "Endocrinology

## 2017-01-24 ENCOUNTER — Encounter: Payer: Self-pay | Admitting: "Endocrinology

## 2017-01-24 VITALS — BP 121/80 | HR 80 | Ht 62.0 in | Wt 172.0 lb

## 2017-01-24 DIAGNOSIS — E669 Obesity, unspecified: Secondary | ICD-10-CM

## 2017-01-24 DIAGNOSIS — E782 Mixed hyperlipidemia: Secondary | ICD-10-CM

## 2017-01-24 DIAGNOSIS — I10 Essential (primary) hypertension: Secondary | ICD-10-CM | POA: Diagnosis not present

## 2017-01-24 DIAGNOSIS — E1169 Type 2 diabetes mellitus with other specified complication: Secondary | ICD-10-CM

## 2017-01-24 MED ORDER — INSULIN GLARGINE 100 UNIT/ML SOLOSTAR PEN: 20.0000 [IU] | PEN_INJECTOR | Freq: Every day | SUBCUTANEOUS | 2 refills | Status: DC

## 2017-01-24 MED ORDER — INSULIN PEN NEEDLE 31G X 8 MM MISC: 1.0000 | 3 refills | Status: DC

## 2017-01-24 NOTE — Progress Notes (Signed)
Subjective:    Patient ID: [name], female    DOB: 1975-12-18, PCP Fayrene Helper, MD   Past Medical History:  Diagnosis Date  . Constipation   . GERD (gastroesophageal reflux disease)   . Hypertension   . Morbid obesity ([place])   . NIDDM (non-insulin dependent diabetes mellitus) 2005   Type II   Past Surgical History:  Procedure Laterality Date  . BACK SURGERY  05/25/2010   Dr,Kabell   . CESAREAN SECTION  6/09, 2015  . LUMBAR LAMINECTOMY/DECOMPRESSION MICRODISCECTOMY Left 09/13/2015   Procedure: Left Lumbar five-Sacral one microdiskectomy;  Surgeon: Ashok Pall, MD;  Location: [place] [facility];  Service: Neurosurgery;  Laterality: Left;  Left Lumbar five-Sacral one microdiskectomy   Social History   Social History  . Marital status: Married    Spouse name: N/A  . Number of children: N/A  . Years of education: N/A   Occupational History  . unemployed     Social History Main Topics  . Smoking status: Never Smoker  . Smokeless tobacco: Never Used  . Alcohol use No  . Drug use: No  . Sexual activity: Yes    Birth control/ protection: None   Other Topics Concern  . None   Social History Narrative  . None   Outpatient Encounter Prescriptions as of 01/24/2017  Medication Sig  . hydrochlorothiazide (HYDRODIURIL) 25 MG tablet TAKE 1 TABLET BY MOUTH DAILY EMERGENCY REFILL FAXED DR  . Insulin Glargine (LANTUS SOLOSTAR) 100 UNIT/ML Solostar Pen Inject 20 Units into the skin daily at 10 pm.  . Insulin Pen Needle (B-D ULTRAFINE III SHORT PEN) 31G X 8 MM MISC 1 each by Does not apply route as directed.  Marland Kitchen omeprazole (PRILOSEC) 20 MG capsule TAKE 1 CAPSULE BY MOUTH EVERY DAY  . pravastatin (PRAVACHOL) 10 MG tablet Take 1 tablet (10 mg total) by mouth at bedtime.  . propranolol (INDERAL) 40 MG tablet Take 1 tablet (40 mg total) by mouth 2 (two) times daily.  . sitaGLIPtin-metformin (JANUMET) 50-1000 MG tablet Take 1 tablet by mouth 2 (two) times daily.  .  [DISCONTINUED] dapagliflozin propanediol (FARXIGA) 5 MG TABS tablet Take 10 mg by mouth daily.  . [DISCONTINUED] Insulin Glargine (LANTUS SOLOSTAR) 100 UNIT/ML Solostar Pen Inject 20 Units into the skin daily at 10 pm.  . [DISCONTINUED] Insulin Pen Needle (B-D ULTRAFINE III SHORT PEN) 31G X 8 MM MISC 1 each by Does not apply route as directed.  . [DISCONTINUED] oseltamivir (TAMIFLU) 75 MG capsule Take 1 capsule (75 mg total) by mouth 2 (two) times daily.   No facility-administered encounter medications on file as of 01/24/2017.    ALLERGIES: Allergies  Allergen Reactions  . Ace Inhibitors Cough  . Amlodipine Besylate     REACTION: severe edema of legs   VACCINATION STATUS: Immunization History  Administered Date(s) Administered  . Influenza Split 05/20/2011, 05/17/2014  . Influenza Whole 06/09/2006, 05/02/2008, 03/06/2010  . Influenza,inj,Quad PF,36+ Mos 05/17/2015, 04/08/2016  . Pneumococcal Polysaccharide-23 08/14/2009, 10/15/2016  . Td 12/05/2009    Diabetes  She presents for her follow-up diabetic visit. She has type 2 diabetes mellitus. Onset time: She was diagnosed at age of 81 years. Her disease course has been stable. There are no hypoglycemic associated symptoms. Pertinent negatives for hypoglycemia include no confusion, headaches, pallor or seizures. Pertinent negatives for diabetes include no blurred vision, no chest pain, no fatigue, no polydipsia, no polyphagia and no polyuria. There are no hypoglycemic complications. Symptoms are stable. There  are no diabetic complications. Risk factors for coronary artery disease include diabetes mellitus, dyslipidemia, hypertension, obesity and sedentary lifestyle. Current diabetic treatment includes oral agent (dual therapy). She is compliant with treatment most of the time. Her weight is decreasing steadily. She is following a generally unhealthy diet. She has had a previous visit with a dietitian. She never participates in exercise. Home  blood sugar record trend: Her A1c increased from 8.4% to 12 .1%. (She came with no meter nor logs to review) An ACE inhibitor/angiotensin II receptor blocker is contraindicated.  Hyperlipidemia  This is a chronic problem. The current episode started more than 1 year ago. Pertinent negatives include no chest pain, myalgias or shortness of breath. Current antihyperlipidemic treatment includes statins. Risk factors for coronary artery disease include dyslipidemia, diabetes mellitus, hypertension, obesity and a sedentary lifestyle.  Hypertension  This is a chronic problem. The current episode started more than 1 year ago. Pertinent negatives include no blurred vision, chest pain, headaches, palpitations or shortness of breath. Past treatments include diuretics. The current treatment provides moderate improvement.     Review of Systems  Constitutional: Negative for fatigue and unexpected weight change.  HENT: Negative for trouble swallowing and voice change.   Eyes: Negative for blurred vision and visual disturbance.  Respiratory: Negative for cough, shortness of breath and wheezing.   Cardiovascular: Negative for chest pain, palpitations and leg swelling.  Gastrointestinal: Negative for diarrhea, nausea and vomiting.  Endocrine: Negative for cold intolerance, heat intolerance, polydipsia, polyphagia and polyuria.  Musculoskeletal: Negative for arthralgias and myalgias.  Skin: Negative for color change, pallor, rash and wound.  Neurological: Negative for seizures and headaches.  Psychiatric/Behavioral: Negative for confusion and suicidal ideas.    Objective:    BP 121/80   Pulse 80   Ht 5\' 2"  (1.575 m)   Wt 172 lb (78 kg)   BMI 31.46 kg/m   Wt Readings from Last 3 Encounters:  01/24/17 172 lb (78 kg)  10/22/16 179 lb (81.2 kg)  10/15/16 182 lb 1.9 oz (82.6 kg)    Physical Exam  Constitutional: She is oriented to person, place, and time. She appears well-developed.  HENT:  Head:  Normocephalic and atraumatic.  Eyes: EOM are normal.  Neck: Normal range of motion. Neck supple. No tracheal deviation present. No thyromegaly present.  Cardiovascular: Normal rate and regular rhythm.   Pulmonary/Chest: Effort normal and breath sounds normal.  Abdominal: Soft. Bowel sounds are normal. There is no tenderness. There is no guarding.  Musculoskeletal: Normal range of motion. She exhibits no edema.  Neurological: She is alert and oriented to person, place, and time. She has normal reflexes. No cranial nerve deficit. Coordination normal.  Skin: Skin is warm and dry. No rash noted. No erythema. No pallor.  Psychiatric: She has a normal mood and affect. Judgment normal.    Results for orders placed or performed in visit on 01/14/17  Comprehensive metabolic panel  Result Value Ref Range   Sodium 136 135 - 146 mmol/L   Potassium 4.0 3.5 - 5.3 mmol/L   Chloride 94 (L) 98 - 110 mmol/L   CO2 25 20 - 31 mmol/L   Glucose, Bld 322 (H) 65 - 99 mg/dL   BUN 11 7 - 25 mg/dL   Creat 0.97 0.50 - 1.10 mg/dL   Total Bilirubin 0.3 0.2 - 1.2 mg/dL   Alkaline Phosphatase 69 33 - 115 U/L   AST 15 10 - 30 U/L   ALT 20 6 - 29 U/L  Total Protein 7.0 6.1 - 8.1 g/dL   Albumin 4.0 3.6 - 5.1 g/dL   Calcium 9.4 8.6 - 10.2 mg/dL  Hemoglobin A1c  Result Value Ref Range   Hgb A1c MFr Bld 11.2 (H) <5.7 %   Mean Plasma Glucose 275 mg/dL   Diabetic Labs (most recent): Lab Results  Component Value Date   HGBA1C 11.2 (H) 01/14/2017   HGBA1C 8.1 (H) 10/11/2016   HGBA1C 9.7 (H) 04/05/2016   Lipid Panel     Component Value Date/Time   CHOL 122 10/11/2016 0758   TRIG 65 10/11/2016 0758   HDL 35 (L) 10/11/2016 0758   CHOLHDL 3.5 10/11/2016 0758   VLDL 13 10/11/2016 0758   LDLCALC 74 10/11/2016 0758     Assessment & Plan:   1. Diabetes mellitus type 2 in obese [facility])  -Patient remains at a high risk for more acute and chronic complications of diabetes which include CAD, CVA, CKD, retinopathy,  and neuropathy. These are all discussed in detail with the patient.  Patient came with loss of control of diabetes with A1c is 11.2% increasing from 8.1%. - This is mainly due to the fact that she stopped her Wilder Glade for urinary frequency even though she was advised that this is how the medication works.  Recent labs reviewed.  - I have re-counseled the patient on diet management and weight loss  by adopting a carbohydrate restricted / protein rich  Diet.  - Suggestion is made for patient to avoid simple carbohydrates   from   Her diet including Cakes , Desserts, Ice Cream,  Soda (  diet and regular) , Sweet Tea , Candies,  Chips, Cookies, Artificial Sweeteners,   and "Sugar-free" Products .  This will help patient to have stable blood glucose profile and potentially avoid unintended  Weight gain.  - Patient is advised to stick to a routine mealtimes to eat 3 meals  a day and avoid unnecessary snacks ( to snack only to correct hypoglycemia).  - The patient  has been  scheduled with Jearld Fenton, RDN, CDE for individualized DM education.  - I have approached patient with the following individualized plan to manage diabetes and patient agrees.  - Given her loss of control of diabetes and A1c of 11.2%, and due to the fact that she did not tolerate SGLT2 inhibitors, she is being considered for basal insulin. - I would initiate Lantus 20 units daily at bedtime, associated with strict monitoring of blood glucose 4 times a day-before meals and at bedtime and return in 4 weeks with her logs in meter. -  I will continue Janumet 50/1000mg  po BID, therapeutically suitable for patient. - I will I'll discontinue Farxiga .  -Patient is encouraged to call clinic for blood glucose levels less than 70 or above 300 mg /dl. - Patient specific target  for A1c; LDL, HDL, Triglycerides, and  Waist Circumference were discussed in detail.  2) BP/HTN: Controlled. Continue current medications. 3) Lipids/HPL:   continue statins.  4)  Weight/Diet: CDE consult in progress, exercise, and carbohydrates information provided.  5) Vitamin D deficiency - She is status post therapy with vitamin D 50,000 units weekly for 12 weeks.  6) Chronic Care/Health Maintenance:  -Patient is on a Statin medications (allergic to ACE inhibitors) and encouraged to continue to follow up with Ophthalmology, Podiatrist at least yearly or according to recommendations, and advised to  stay away from smoking. I have recommended yearly flu vaccine and pneumonia vaccination at least every  5 years; moderate intensity exercise for up to 150 minutes weekly; and  sleep for at least 7 hours a day.  - 25 minutes of time was spent on the care of this patient , 50% of which was applied for counseling on diabetes complications and their preventions.  - I advised patient to maintain close follow up with Fayrene Helper, MD for primary care needs.  Patient is asked to bring meter and  blood glucose logs during her next visit.   Follow up plan: -Return in about 4 weeks (around 02/21/2017) for follow up with meter and logs- no labs.  Glade Lloyd, MD Phone: [phone]  Fax: [phone]   01/24/2017, 9:36 AM

## 2017-01-24 NOTE — Patient Instructions (Signed)

## 2017-02-28 ENCOUNTER — Encounter: Payer: Self-pay | Admitting: "Endocrinology

## 2017-02-28 ENCOUNTER — Ambulatory Visit (INDEPENDENT_AMBULATORY_CARE_PROVIDER_SITE_OTHER): Payer: BLUE CROSS/BLUE SHIELD | Admitting: "Endocrinology

## 2017-02-28 VITALS — BP 120/76 | HR 82 | Ht 62.0 in | Wt 174.0 lb

## 2017-02-28 DIAGNOSIS — I10 Essential (primary) hypertension: Secondary | ICD-10-CM | POA: Diagnosis not present

## 2017-02-28 DIAGNOSIS — E669 Obesity, unspecified: Secondary | ICD-10-CM

## 2017-02-28 DIAGNOSIS — E782 Mixed hyperlipidemia: Secondary | ICD-10-CM | POA: Diagnosis not present

## 2017-02-28 DIAGNOSIS — E1169 Type 2 diabetes mellitus with other specified complication: Secondary | ICD-10-CM | POA: Diagnosis not present

## 2017-02-28 DIAGNOSIS — IMO0001 Reserved for inherently not codable concepts without codable children: Secondary | ICD-10-CM

## 2017-02-28 MED ORDER — INSULIN GLARGINE 100 UNIT/ML SOLOSTAR PEN: 30.0000 [IU] | PEN_INJECTOR | Freq: Every day | SUBCUTANEOUS | 2 refills | Status: DC

## 2017-02-28 NOTE — Progress Notes (Signed)
Subjective:    Patient ID: [name], female    DOB: 31-Jan-1976, PCP Fayrene Helper, MD   Past Medical History:  Diagnosis Date  . Constipation   . GERD (gastroesophageal reflux disease)   . Hypertension   . Morbid obesity ([place])   . NIDDM (non-insulin dependent diabetes mellitus) 2005   Type II   Past Surgical History:  Procedure Laterality Date  . BACK SURGERY  05/25/2010   Dr,Kabell   . CESAREAN SECTION  6/09, 2015  . LUMBAR LAMINECTOMY/DECOMPRESSION MICRODISCECTOMY Left 09/13/2015   Procedure: Left Lumbar five-Sacral one microdiskectomy;  Surgeon: Ashok Pall, MD;  Location: [facility];  Service: Neurosurgery;  Laterality: Left;  Left Lumbar five-Sacral one microdiskectomy   Social History   Social History  . Marital status: Married    Spouse name: N/A  . Number of children: N/A  . Years of education: N/A   Occupational History  . unemployed     Social History Main Topics  . Smoking status: Never Smoker  . Smokeless tobacco: Never Used  . Alcohol use No  . Drug use: No  . Sexual activity: Yes    Birth control/ protection: None   Other Topics Concern  . None   Social History Narrative  . None   Outpatient Encounter Prescriptions as of 02/28/2017  Medication Sig  . hydrochlorothiazide (HYDRODIURIL) 25 MG tablet TAKE 1 TABLET BY MOUTH DAILY EMERGENCY REFILL FAXED DR  . Insulin Glargine (LANTUS SOLOSTAR) 100 UNIT/ML Solostar Pen Inject 20 Units into the skin daily at 10 pm.  . Insulin Pen Needle (B-D ULTRAFINE III SHORT PEN) 31G X 8 MM MISC 1 each by Does not apply route as directed.  Marland Kitchen omeprazole (PRILOSEC) 20 MG capsule TAKE 1 CAPSULE BY MOUTH EVERY DAY  . pravastatin (PRAVACHOL) 10 MG tablet Take 1 tablet (10 mg total) by mouth at bedtime.  . propranolol (INDERAL) 40 MG tablet Take 1 tablet (40 mg total) by mouth 2 (two) times daily.  . sitaGLIPtin-metformin (JANUMET) 50-1000 MG tablet Take 1 tablet by mouth 2 (two) times daily.   No  facility-administered encounter medications on file as of 02/28/2017.    ALLERGIES: Allergies  Allergen Reactions  . Ace Inhibitors Cough  . Amlodipine Besylate     REACTION: severe edema of legs   VACCINATION STATUS: Immunization History  Administered Date(s) Administered  . Influenza Split 05/20/2011, 05/17/2014  . Influenza Whole 06/09/2006, 05/02/2008, 03/06/2010  . Influenza,inj,Quad PF,36+ Mos 05/17/2015, 04/08/2016  . Pneumococcal Polysaccharide-23 08/14/2009, 10/15/2016  . Td 12/05/2009    Diabetes  She presents for her follow-up diabetic visit. She has type 2 diabetes mellitus. Onset time: She was diagnosed at age of 21 years. Her disease course has been improving. There are no hypoglycemic associated symptoms. Pertinent negatives for hypoglycemia include no confusion, headaches, pallor or seizures. Pertinent negatives for diabetes include no blurred vision, no chest pain, no fatigue, no polydipsia, no polyphagia and no polyuria. There are no hypoglycemic complications. Symptoms are improving. There are no diabetic complications. Risk factors for coronary artery disease include diabetes mellitus, dyslipidemia, hypertension, obesity and sedentary lifestyle. Current diabetic treatment includes oral agent (dual therapy). She is compliant with treatment most of the time. Her weight is stable. She is following a generally unhealthy diet. She has had a previous visit with a dietitian. She never participates in exercise. Home blood sugar record trend: Her A1c increased from 8.4% to 12 .1%. Her breakfast blood glucose range is generally 140-180  mg/dl. Her lunch blood glucose range is generally 140-180 mg/dl. Her dinner blood glucose range is generally 140-180 mg/dl. Her bedtime blood glucose range is generally 140-180 mg/dl. Her overall blood glucose range is 140-180 mg/dl. (She came with no meter nor logs to review) An ACE inhibitor/angiotensin II receptor blocker is contraindicated.   Hyperlipidemia  This is a chronic problem. The current episode started more than 1 year ago. Pertinent negatives include no chest pain, myalgias or shortness of breath. Current antihyperlipidemic treatment includes statins. Risk factors for coronary artery disease include dyslipidemia, diabetes mellitus, hypertension, obesity and a sedentary lifestyle.  Hypertension  This is a chronic problem. The current episode started more than 1 year ago. Pertinent negatives include no blurred vision, chest pain, headaches, palpitations or shortness of breath. Past treatments include diuretics. The current treatment provides moderate improvement.     Review of Systems  Constitutional: Negative for fatigue and unexpected weight change.  HENT: Negative for trouble swallowing and voice change.   Eyes: Negative for blurred vision and visual disturbance.  Respiratory: Negative for cough, shortness of breath and wheezing.   Cardiovascular: Negative for chest pain, palpitations and leg swelling.  Gastrointestinal: Negative for diarrhea, nausea and vomiting.  Endocrine: Negative for cold intolerance, heat intolerance, polydipsia, polyphagia and polyuria.  Musculoskeletal: Negative for arthralgias and myalgias.  Skin: Negative for color change, pallor, rash and wound.  Neurological: Negative for seizures and headaches.  Psychiatric/Behavioral: Negative for confusion and suicidal ideas.    Objective:    BP 120/76   Pulse 82   Ht 5\' 2"  (1.575 m)   Wt 174 lb (78.9 kg)   BMI 31.83 kg/m   Wt Readings from Last 3 Encounters:  02/28/17 174 lb (78.9 kg)  01/24/17 172 lb (78 kg)  10/22/16 179 lb (81.2 kg)    Physical Exam  Constitutional: She is oriented to person, place, and time. She appears well-developed.  HENT:  Head: Normocephalic and atraumatic.  Eyes: EOM are normal.  Neck: Normal range of motion. Neck supple. No tracheal deviation present. No thyromegaly present.  Cardiovascular: Normal rate and  regular rhythm.   Pulmonary/Chest: Effort normal and breath sounds normal.  Abdominal: Soft. Bowel sounds are normal. There is no tenderness. There is no guarding.  Musculoskeletal: Normal range of motion. She exhibits no edema.  Neurological: She is alert and oriented to person, place, and time. She has normal reflexes. No cranial nerve deficit. Coordination normal.  Skin: Skin is warm and dry. No rash noted. No erythema. No pallor.  Psychiatric: She has a normal mood and affect. Judgment normal.    Results for orders placed or performed in visit on 01/14/17  Comprehensive metabolic panel  Result Value Ref Range   Sodium 136 135 - 146 mmol/L   Potassium 4.0 3.5 - 5.3 mmol/L   Chloride 94 (L) 98 - 110 mmol/L   CO2 25 20 - 31 mmol/L   Glucose, Bld 322 (H) 65 - 99 mg/dL   BUN 11 7 - 25 mg/dL   Creat 0.97 0.50 - 1.10 mg/dL   Total Bilirubin 0.3 0.2 - 1.2 mg/dL   Alkaline Phosphatase 69 33 - 115 U/L   AST 15 10 - 30 U/L   ALT 20 6 - 29 U/L   Total Protein 7.0 6.1 - 8.1 g/dL   Albumin 4.0 3.6 - 5.1 g/dL   Calcium 9.4 8.6 - 10.2 mg/dL  Hemoglobin A1c  Result Value Ref Range   Hgb A1c MFr Bld 11.2 (H) <  5.7 %   Mean Plasma Glucose 275 mg/dL   Diabetic Labs (most recent): Lab Results  Component Value Date   HGBA1C 11.2 (H) 01/14/2017   HGBA1C 8.1 (H) 10/11/2016   HGBA1C 9.7 (H) 04/05/2016   Lipid Panel     Component Value Date/Time   CHOL 122 10/11/2016 0758   TRIG 65 10/11/2016 0758   HDL 35 (L) 10/11/2016 0758   CHOLHDL 3.5 10/11/2016 0758   VLDL 13 10/11/2016 0758   LDLCALC 74 10/11/2016 0758     Assessment & Plan:   1. Diabetes mellitus type 2 in obese [facility])  -Patient remains at a high risk for more acute and chronic complications of diabetes which include CAD, CVA, CKD, retinopathy, and neuropathy. These are all discussed in detail with the patient.  Patient  Recently came with loss of control of diabetes with A1c is 11.2% increasing from 8.1%. - She is responding  to basal insulin with readings close to target for the last 14 days. - This is mainly due to the fact that she stopped her Wilder Glade for urinary frequency even though she was advised that this is how the medication works.  Recent labs reviewed.  - I have re-counseled the patient on diet management and weight loss  by adopting a carbohydrate restricted / protein rich  Diet.  - Suggestion is made for patient to avoid simple carbohydrates   from   Her diet including Cakes , Desserts, Ice Cream,  Soda (  diet and regular) , Sweet Tea , Candies,  Chips, Cookies, Artificial Sweeteners,   and "Sugar-free" Products .  This will help patient to have stable blood glucose profile and potentially avoid unintended  Weight gain.  - Patient is advised to stick to a routine mealtimes to eat 3 meals  a day and avoid unnecessary snacks ( to snack only to correct hypoglycemia).  - The patient  has been  scheduled with Jearld Fenton, RDN, CDE for individualized DM education.  - I have approached patient with the following individualized plan to manage diabetes and patient agrees.  - Given the fact that she did not tolerate SGLT2 inhibitors, she  Will continue to need basal insulin. - Based on her glycemic response, she will not require bolus insulin for now. - I will increase Lantus to 30 units daily at bedtime, associated with strict monitoring of blood glucose 2 times a day-before   breakfast and at bedtime and return in 9 weeks with her logs and laughs. -  I will continue Janumet 50/1000mg  po BID, therapeutically suitable for patient. - I will I'll discontinue Farxiga .  -Patient is encouraged to call clinic for blood glucose levels less than 70 or above 300 mg /dl. - Patient specific target  for A1c; LDL, HDL, Triglycerides, and  Waist Circumference were discussed in detail.  2) BP/HTN: Controlled. Continue current medications. 3) Lipids/HPL:  continue statins.  4)  Weight/Diet: CDE consult in progress,  exercise, and carbohydrates information provided.  5) Vitamin D deficiency - She is status post therapy with vitamin D 50,000 units weekly for 12 weeks.  6) Chronic Care/Health Maintenance:  -Patient is on a Statin medications (allergic to ACE inhibitors) and encouraged to continue to follow up with Ophthalmology, Podiatrist at least yearly or according to recommendations, and advised to  stay away from smoking. I have recommended yearly flu vaccine and pneumonia vaccination at least every 5 years; moderate intensity exercise for up to 150 minutes weekly; and  sleep for  at least 7 hours a day.  - 20 minutes of time was spent on the care of this patient , 50% of which was applied for counseling on diabetes complications and their preventions.  - I advised patient to maintain close follow up with Fayrene Helper, MD for primary care needs.  Patient is asked to bring meter and  blood glucose logs during her next visit.   Follow up plan: -Return in about 9 weeks (around 05/02/2017) for follow up with pre-visit labs, meter, and logs.  Glade Lloyd, MD Phone: [phone]  Fax: [phone]   02/28/2017, 9:40 AM

## 2017-02-28 NOTE — Patient Instructions (Signed)

## 2017-03-05 ENCOUNTER — Other Ambulatory Visit: Payer: Self-pay | Admitting: Family Medicine

## 2017-03-05 NOTE — Telephone Encounter (Signed)
Seen 10/15/16.

## 2017-03-20 ENCOUNTER — Telehealth: Payer: Self-pay | Admitting: "Endocrinology

## 2017-03-20 ENCOUNTER — Other Ambulatory Visit: Payer: Self-pay | Admitting: *Deleted

## 2017-03-20 MED ORDER — INSULIN GLARGINE 100 UNIT/ML SOLOSTAR PEN: 30.0000 [IU] | PEN_INJECTOR | Freq: Every day | SUBCUTANEOUS | 2 refills | Status: DC

## 2017-03-20 NOTE — Telephone Encounter (Signed)
Rowen IS STATING THAT SHE NEEDS A NEW RX FOR 30 UNITS OF LANTUS BE CALLED TO [facility], PLEASE ADVISE?

## 2017-03-20 NOTE — Telephone Encounter (Signed)
LVM to inform patient request for  30 UNITS OF LANTUS  CALLED TO [facility]

## 2017-04-17 ENCOUNTER — Encounter: Payer: Self-pay | Admitting: Family Medicine

## 2017-04-17 ENCOUNTER — Encounter: Payer: Self-pay | Admitting: "Endocrinology

## 2017-04-17 DIAGNOSIS — E669 Obesity, unspecified: Principal | ICD-10-CM

## 2017-04-17 DIAGNOSIS — E782 Mixed hyperlipidemia: Secondary | ICD-10-CM

## 2017-04-17 DIAGNOSIS — E1169 Type 2 diabetes mellitus with other specified complication: Secondary | ICD-10-CM

## 2017-04-17 DIAGNOSIS — I10 Essential (primary) hypertension: Secondary | ICD-10-CM

## 2017-04-22 LAB — HEPATIC FUNCTION PANEL
AG Ratio: 1.3 (calc) (ref 1.0–2.5)
ALKALINE PHOSPHATASE (APISO): 63 U/L (ref 33–115)
ALT: 11 U/L (ref 6–29)
AST: 12 U/L (ref 10–30)
Albumin: 3.9 g/dL (ref 3.6–5.1)
BILIRUBIN INDIRECT: 0.3 mg/dL (ref 0.2–1.2)
Bilirubin, Direct: 0.1 mg/dL (ref 0.0–0.2)
Globulin: 2.9 g/dL (calc) (ref 1.9–3.7)
TOTAL PROTEIN: 6.8 g/dL (ref 6.1–8.1)
Total Bilirubin: 0.4 mg/dL (ref 0.2–1.2)

## 2017-04-22 LAB — LIPID PANEL
CHOLESTEROL: 119 mg/dL (ref <200)
HDL: 33 mg/dL — AB (ref >50)
LDL Cholesterol (Calc): 70 mg/dL (calc)
NON-HDL CHOLESTEROL (CALC): 86 mg/dL (ref <130)
Total CHOL/HDL Ratio: 3.6 (calc) (ref <5.0)
Triglycerides: 82 mg/dL (ref <150)

## 2017-04-22 LAB — CBC
HCT: 35.3 % (ref 35.0–45.0)
HEMOGLOBIN: 11.4 g/dL — AB (ref 11.7–15.5)
MCH: 25.7 pg — ABNORMAL LOW (ref 27.0–33.0)
MCHC: 32.3 g/dL (ref 32.0–36.0)
MCV: 79.7 fL — ABNORMAL LOW (ref 80.0–100.0)
MPV: 12.4 fL (ref 7.5–12.5)
Platelets: 232 10*3/uL (ref 140–400)
RBC: 4.43 10*6/uL (ref 3.80–5.10)
RDW: 12.5 % (ref 11.0–15.0)
WBC: 8.1 10*3/uL (ref 3.8–10.8)

## 2017-04-22 LAB — TSH: TSH: 0.57 mIU/L

## 2017-04-23 ENCOUNTER — Other Ambulatory Visit: Payer: Self-pay | Admitting: Neurosurgery

## 2017-04-23 LAB — RENAL FUNCTION PANEL
Albumin: 3.9 g/dL (ref 3.6–5.1)
BUN: 11 mg/dL (ref 7–25)
CALCIUM: 9 mg/dL (ref 8.6–10.2)
CHLORIDE: 100 mmol/L (ref 98–110)
CO2: 33 mmol/L — AB (ref 20–32)
Creat: 0.8 mg/dL (ref 0.50–1.10)
GLUCOSE: 125 mg/dL — AB (ref 65–99)
PHOSPHORUS: 3.2 mg/dL (ref 2.5–4.5)
Potassium: 3.9 mmol/L (ref 3.5–5.3)
Sodium: 139 mmol/L (ref 135–146)

## 2017-04-23 LAB — HEMOGLOBIN A1C
HEMOGLOBIN A1C: 7.8 %{Hb} — AB (ref <5.7)
Mean Plasma Glucose: 177 (calc)
eAG (mmol/L): 9.8 (calc)

## 2017-04-28 ENCOUNTER — Encounter: Payer: Self-pay | Admitting: Family Medicine

## 2017-04-28 ENCOUNTER — Ambulatory Visit (INDEPENDENT_AMBULATORY_CARE_PROVIDER_SITE_OTHER): Payer: BLUE CROSS/BLUE SHIELD | Admitting: Family Medicine

## 2017-04-28 VITALS — BP 122/76 | HR 96 | Temp 98.9°F | Resp 16 | Ht 62.0 in | Wt 175.8 lb

## 2017-04-28 DIAGNOSIS — E669 Obesity, unspecified: Secondary | ICD-10-CM | POA: Diagnosis not present

## 2017-04-28 DIAGNOSIS — Z803 Family history of malignant neoplasm of breast: Secondary | ICD-10-CM | POA: Diagnosis not present

## 2017-04-28 DIAGNOSIS — Z1231 Encounter for screening mammogram for malignant neoplasm of breast: Secondary | ICD-10-CM

## 2017-04-28 DIAGNOSIS — E782 Mixed hyperlipidemia: Secondary | ICD-10-CM | POA: Diagnosis not present

## 2017-04-28 DIAGNOSIS — I10 Essential (primary) hypertension: Secondary | ICD-10-CM | POA: Diagnosis not present

## 2017-04-28 DIAGNOSIS — E559 Vitamin D deficiency, unspecified: Secondary | ICD-10-CM | POA: Diagnosis not present

## 2017-04-28 DIAGNOSIS — IMO0001 Reserved for inherently not codable concepts without codable children: Secondary | ICD-10-CM

## 2017-04-28 DIAGNOSIS — Z23 Encounter for immunization: Secondary | ICD-10-CM

## 2017-04-28 DIAGNOSIS — M5432 Sciatica, left side: Secondary | ICD-10-CM

## 2017-04-28 DIAGNOSIS — E1169 Type 2 diabetes mellitus with other specified complication: Secondary | ICD-10-CM

## 2017-04-28 NOTE — Assessment & Plan Note (Signed)
No recent flare, contemplating surgery

## 2017-04-28 NOTE — Patient Instructions (Addendum)
F/u in 5.5  month, cal  If you need me before  pls get mammogram appt at checkout  Flu vaccine todaY  WEIGHT LOSS GOAL OF 10 POUNDS Fasting lipid, cmp and EGFr, vit D, cBc, iron and ferritin 1 week before next visit  It is important that you exercise regularly at least 30 minutes 5 times a week. If you develop chest pain, have severe difficulty breathing, or feel very tired, stop exercising immediately and seek medical attention   Thank you  for choosing [facility]. We consider it a privelige to serve you.  Delivering excellent health care in a caring and  compassionate way is our goal.  Partnering with you,  so that together we can achieve this goal is our strategy.

## 2017-04-28 NOTE — Progress Notes (Signed)
[name]     MRN: [id number]      DOB: 17-Jul-1976   HPI [name] is here for follow up and re-evaluation of chronic medical conditions, medication management.Questions or concerns regarding consultations or procedures which the PT has had in the interim are  Addressed.Recently saw neurosurgeon who is recommending fusion , but she still does not feel as though she needs this now as her pain is not severe and she has no one to care for her and her chi;ldren during her recovery time The PT denies any adverse reactions to current medications since the last visit.  Concerned about uncontrolled blood sugar , has upcoming endo appt this week Needs flu vaccine and mammogram,  Mother dx with breast cancer at age 34  ROS Denies recent fever or chills. Denies sinus pressure, nasal congestion, ear pain or sore throat. Denies chest congestion, productive cough or wheezing. Denies chest pains, palpitations and leg swelling Denies abdominal pain, nausea, vomiting,diarrhea or constipation.   Denies dysuria, frequency, hesitancy or incontinence. Denies joint pain, swelling and limitation in mobility. Denies headaches, seizures, numbness, or tingling. Denies depression, anxiety or insomnia. Denies skin break down or rash.   PE  BP 122/76 (BP Location: Left Arm, Patient Position: Sitting, Cuff Size: Normal)   Pulse 96   Temp 98.9 F (37.2 C) (Other (Comment))   Resp 16   Ht 5\' 2"  (1.575 m)   Wt 175 lb 12 oz (79.7 kg)   SpO2 98%   BMI 32.15 kg/m   Patient alert and oriented and in no cardiopulmonary distress.  HEENT: No facial asymmetry, EOMI,   oropharynx pink and moist.  Neck supple no JVD, no mass.  Chest: Clear to auscultation bilaterally.  CVS: S1, S2 no murmurs, no S3.Regular rate.  ABD: Soft non tender.   Ext: No edema  MS: Adequate ROM spine, shoulders, hips and knees.  Skin: Intact, no ulcerations or rash noted.  Psych: Good eye contact, normal affect. Memory  intact not anxious or depressed appearing.  CNS: CN 2-12 intact, power,  normal throughout.no focal deficits noted.   Assessment & Plan  Essential hypertension Controlled, no change in medication DASH diet and commitment to daily physical activity for a minimum of 30 minutes discussed and encouraged, as a part of hypertension management. The importance of attaining a healthy weight is also discussed.  BP/Weight 04/28/2017 02/28/2017 01/24/2017 10/22/2016 10/15/2016 0/53/9767 10/14/1935  Systolic BP 902 409 735 329 924 268 341  Diastolic BP 76 76 80 79 84 82 70  Wt. (Lbs) 175.75 174 172 179 182.12 179 185  BMI 32.15 31.83 31.46 32.74 33.31 32.74 33.84       Diabetes mellitus type 2 in obese ImpRoved, managed by endo, has upcoming appt [name] is reminded of the importance of commitment to daily physical activity for 30 minutes or more, as able and the need to limit carbohydrate intake to 30 to 60 grams per meal to help with blood sugar control.   The need to take medication as prescribed, test blood sugar as directed, and to call between visits if there is a concern that blood sugar is uncontrolled is also discussed.   [name] is reminded of the importance of daily foot exam, annual eye examination, and good blood sugar, blood pressure and cholesterol control.  Diabetic Labs Latest Ref Rng & Units 04/22/2017 01/14/2017 10/16/2016 10/11/2016 04/05/2016  HbA1c <5.7 % of total Hgb 7.8(H) 11.2(H) - 8.1(H) 9.7(H)  Microalbumin Not Estab.  ug/mL - - 35.2(H) - -  Micro/Creat Ratio 0.0 - 30.0 mg/g creat - - 23.1 - -  Chol <200 mg/dL 119 - - 122 -  HDL >50 mg/dL 33(L) - - 35(L) -  Calc LDL <100 mg/dL - - - 74 -  Triglycerides <150 mg/dL 82 - - 65 -  Creatinine 0.50 - 1.10 mg/dL 0.80 0.97 - 0.96 0.86   BP/Weight 04/28/2017 02/28/2017 01/24/2017 10/22/2016 10/15/2016 2/33/0076 09/14/6331  Systolic BP 545 625 638 937 342 876 811  Diastolic BP 76 76 80 79 84 82 70  Wt. (Lbs) 175.75 174 172 179 182.12 179  185  BMI 32.15 31.83 31.46 32.74 33.31 32.74 33.84   Foot/eye exam completion dates Latest Ref Rng & Units 10/15/2016 09/19/2016  Eye Exam No Retinopathy - No Retinopathy  Foot exam Order - - -  Foot Form Completion - Done -     \   Mixed hyperlipidemia Hyperlipidemia:Low fat diet discussed and encouraged.   Lipid Panel  Lab Results  Component Value Date   CHOL 119 04/22/2017   HDL 33 (L) 04/22/2017   LDLCALC 74 10/11/2016   TRIG 82 04/22/2017   CHOLHDL 3.6 04/22/2017   Needs to increase exercise   Obesity, Class II, BMI 35-39.9, with comorbidity Deteriorated. Patient re-educated about  the importance of commitment to a  minimum of 150 minutes of exercise per week.  The importance of healthy food choices with portion control discussed. Encouraged to start a food diary, count calories and to consider  joining a support group. Sample diet sheets offered. Goals set by the patient for the next several months.   Weight /BMI 04/28/2017 02/28/2017 01/24/2017  WEIGHT 175 lb 12 oz 174 lb 172 lb  HEIGHT 5\' 2"  5\' 2"  5\' 2"   BMI 32.15 kg/m2 31.83 kg/m2 31.46 kg/m2      Vitamin D deficiency Updated lab needed at/ before next visit.   Back pain with left-sided sciatica No recent flare, contemplating surgery

## 2017-04-28 NOTE — Assessment & Plan Note (Signed)
Hyperlipidemia:Low fat diet discussed and encouraged.   Lipid Panel  Lab Results  Component Value Date   CHOL 119 04/22/2017   HDL 33 (L) 04/22/2017   LDLCALC 74 10/11/2016   TRIG 82 04/22/2017   CHOLHDL 3.6 04/22/2017   Needs to increase exercise

## 2017-04-28 NOTE — Assessment & Plan Note (Signed)
Controlled, no change in medication DASH diet and commitment to daily physical activity for a minimum of 30 minutes discussed and encouraged, as a part of hypertension management. The importance of attaining a healthy weight is also discussed.  BP/Weight 04/28/2017 02/28/2017 01/24/2017 10/22/2016 10/15/2016 8/68/2574 04/14/5520  Systolic BP 747 159 539 672 897 915 041  Diastolic BP 76 76 80 79 84 82 70  Wt. (Lbs) 175.75 174 172 179 182.12 179 185  BMI 32.15 31.83 31.46 32.74 33.31 32.74 33.84

## 2017-04-28 NOTE — Assessment & Plan Note (Addendum)
ImpRoved, managed by endo, has upcoming appt [name] is reminded of the importance of commitment to daily physical activity for 30 minutes or more, as able and the need to limit carbohydrate intake to 30 to 60 grams per meal to help with blood sugar control.   The need to take medication as prescribed, test blood sugar as directed, and to call between visits if there is a concern that blood sugar is uncontrolled is also discussed.   [name] is reminded of the importance of daily foot exam, annual eye examination, and good blood sugar, blood pressure and cholesterol control.  Diabetic Labs Latest Ref Rng & Units 04/22/2017 01/14/2017 10/16/2016 10/11/2016 04/05/2016  HbA1c <5.7 % of total Hgb 7.8(H) 11.2(H) - 8.1(H) 9.7(H)  Microalbumin Not Estab. ug/mL - - 35.2(H) - -  Micro/Creat Ratio 0.0 - 30.0 mg/g creat - - 23.1 - -  Chol <200 mg/dL 119 - - 122 -  HDL >50 mg/dL 33(L) - - 35(L) -  Calc LDL <100 mg/dL - - - 74 -  Triglycerides <150 mg/dL 82 - - 65 -  Creatinine 0.50 - 1.10 mg/dL 0.80 0.97 - 0.96 0.86   BP/Weight 04/28/2017 02/28/2017 01/24/2017 10/22/2016 10/15/2016 2/68/3419 01/12/2296  Systolic BP 989 211 941 740 814 481 856  Diastolic BP 76 76 80 79 84 82 70  Wt. (Lbs) 175.75 174 172 179 182.12 179 185  BMI 32.15 31.83 31.46 32.74 33.31 32.74 33.84   Foot/eye exam completion dates Latest Ref Rng & Units 10/15/2016 09/19/2016  Eye Exam No Retinopathy - No Retinopathy  Foot exam Order - - -  Foot Form Completion - Done -     \

## 2017-04-28 NOTE — Assessment & Plan Note (Signed)
Deteriorated. Patient re-educated about  the importance of commitment to a  minimum of 150 minutes of exercise per week.  The importance of healthy food choices with portion control discussed. Encouraged to start a food diary, count calories and to consider  joining a support group. Sample diet sheets offered. Goals set by the patient for the next several months.   Weight /BMI 04/28/2017 02/28/2017 01/24/2017  WEIGHT 175 lb 12 oz 174 lb 172 lb  HEIGHT 5\' 2"  5\' 2"  5\' 2"   BMI 32.15 kg/m2 31.83 kg/m2 31.46 kg/m2

## 2017-04-28 NOTE — Assessment & Plan Note (Signed)
Updated lab needed at/ before next visit.   

## 2017-04-30 ENCOUNTER — Ambulatory Visit (HOSPITAL_COMMUNITY)
Admission: RE | Admit: 2017-04-30 | Discharge: 2017-04-30 | Disposition: A | Discharge status: Home / Self Care | Payer: BLUE CROSS/BLUE SHIELD | Source: Ambulatory Visit | Attending: Family Medicine | Admitting: Family Medicine

## 2017-04-30 ENCOUNTER — Other Ambulatory Visit: Payer: Self-pay | Admitting: Family Medicine

## 2017-04-30 DIAGNOSIS — I10 Essential (primary) hypertension: Secondary | ICD-10-CM

## 2017-04-30 DIAGNOSIS — Z803 Family history of malignant neoplasm of breast: Secondary | ICD-10-CM

## 2017-04-30 DIAGNOSIS — E669 Obesity, unspecified: Secondary | ICD-10-CM

## 2017-04-30 DIAGNOSIS — E1169 Type 2 diabetes mellitus with other specified complication: Secondary | ICD-10-CM

## 2017-04-30 DIAGNOSIS — Z1231 Encounter for screening mammogram for malignant neoplasm of breast: Secondary | ICD-10-CM | POA: Insufficient documentation

## 2017-05-02 ENCOUNTER — Ambulatory Visit (INDEPENDENT_AMBULATORY_CARE_PROVIDER_SITE_OTHER): Payer: BLUE CROSS/BLUE SHIELD | Admitting: "Endocrinology

## 2017-05-02 ENCOUNTER — Encounter: Payer: Self-pay | Admitting: "Endocrinology

## 2017-05-02 VITALS — BP 134/86 | HR 75 | Ht 62.0 in | Wt 176.6 lb

## 2017-05-02 DIAGNOSIS — E782 Mixed hyperlipidemia: Secondary | ICD-10-CM

## 2017-05-02 DIAGNOSIS — E1169 Type 2 diabetes mellitus with other specified complication: Secondary | ICD-10-CM | POA: Diagnosis not present

## 2017-05-02 DIAGNOSIS — I10 Essential (primary) hypertension: Secondary | ICD-10-CM

## 2017-05-02 DIAGNOSIS — IMO0001 Reserved for inherently not codable concepts without codable children: Secondary | ICD-10-CM

## 2017-05-02 DIAGNOSIS — E669 Obesity, unspecified: Secondary | ICD-10-CM

## 2017-05-02 NOTE — Patient Instructions (Signed)

## 2017-05-02 NOTE — Progress Notes (Signed)
Subjective:    Patient ID: [name], female    DOB: 31-Dec-1975, PCP Fayrene Helper, MD   Past Medical History:  Diagnosis Date  . Constipation   . GERD (gastroesophageal reflux disease)   . Hypertension   . Morbid obesity ([place])   . NIDDM (non-insulin dependent diabetes mellitus) 2005   Type II   Past Surgical History:  Procedure Laterality Date  . BACK SURGERY  05/25/2010   Dr,Kabell   . CESAREAN SECTION  6/09, 2015  . LUMBAR LAMINECTOMY/DECOMPRESSION MICRODISCECTOMY Left 09/13/2015   Procedure: Left Lumbar five-Sacral one microdiskectomy;  Surgeon: Ashok Pall, MD;  Location: [facility];  Service: Neurosurgery;  Laterality: Left;  Left Lumbar five-Sacral one microdiskectomy   Social History   Social History  . Marital status: Married    Spouse name: N/A  . Number of children: N/A  . Years of education: N/A   Occupational History  . unemployed     Social History Main Topics  . Smoking status: Never Smoker  . Smokeless tobacco: Never Used  . Alcohol use No  . Drug use: No  . Sexual activity: Yes    Birth control/ protection: None   Other Topics Concern  . None   Social History Narrative  . None   Outpatient Encounter Prescriptions as of 05/02/2017  Medication Sig  . GLOBAL INJECT EASE LANCETS 28G MISC USE TO TEST BLOOD SUGAR TWICE DAILY  . hydrochlorothiazide (HYDRODIURIL) 25 MG tablet TAKE 1 TABLET BY MOUTH DAILY EMERGENCY REFILL FAXED DR  . Insulin Glargine (LANTUS SOLOSTAR) 100 UNIT/ML Solostar Pen Inject 30 Units into the skin daily at 10 pm.  . Insulin Pen Needle (B-D ULTRAFINE III SHORT PEN) 31G X 8 MM MISC 1 each by Does not apply route as directed.  Marland Kitchen omeprazole (PRILOSEC) 20 MG capsule TAKE 1 CAPSULE BY MOUTH EVERY DAY  . pravastatin (PRAVACHOL) 10 MG tablet TAKE 1 TABLET BY MOUTH AT BEDTIME.  Marland Kitchen propranolol (INDERAL) 40 MG tablet TAKE 1 TABLET BY MOUTH 2 TIMES DAILY.  . sitaGLIPtin-metformin (JANUMET) 50-1000 MG tablet Take 1  tablet by mouth 2 (two) times daily.   No facility-administered encounter medications on file as of 05/02/2017.    ALLERGIES: Allergies  Allergen Reactions  . Ace Inhibitors Cough  . Amlodipine Besylate     REACTION: severe edema of legs   VACCINATION STATUS: Immunization History  Administered Date(s) Administered  . Influenza Split 05/20/2011, 05/17/2014  . Influenza Whole 06/09/2006, 05/02/2008, 03/06/2010  . Influenza,inj,Quad PF,6+ Mos 05/17/2015, 04/08/2016, 04/28/2017  . Pneumococcal Polysaccharide-23 08/14/2009, 10/15/2016  . Td 12/05/2009    Diabetes  She presents for her follow-up diabetic visit. She has type 2 diabetes mellitus. Onset time: She was diagnosed at age of 38 years. Her disease course has been improving. There are no hypoglycemic associated symptoms. Pertinent negatives for hypoglycemia include no confusion, headaches, pallor or seizures. Pertinent negatives for diabetes include no blurred vision, no chest pain, no fatigue, no polydipsia, no polyphagia and no polyuria. There are no hypoglycemic complications. Symptoms are improving. There are no diabetic complications. Risk factors for coronary artery disease include diabetes mellitus, dyslipidemia, hypertension, obesity and sedentary lifestyle. Current diabetic treatment includes oral agent (dual therapy). She is compliant with treatment most of the time. Her weight is stable. She is following a generally unhealthy diet. She has had a previous visit with a dietitian. She never participates in exercise. Home blood sugar record trend: Her A1c increased from 8.4% to 12 .  1%. Her breakfast blood glucose range is generally 140-180 mg/dl. Her lunch blood glucose range is generally 140-180 mg/dl. Her dinner blood glucose range is generally 140-180 mg/dl. Her bedtime blood glucose range is generally 140-180 mg/dl. Her overall blood glucose range is 140-180 mg/dl. (She came with no meter nor logs to review) An ACE  inhibitor/angiotensin II receptor blocker is contraindicated.  Hyperlipidemia  This is a chronic problem. The current episode started more than 1 year ago. Pertinent negatives include no chest pain, myalgias or shortness of breath. Current antihyperlipidemic treatment includes statins. Risk factors for coronary artery disease include dyslipidemia, diabetes mellitus, hypertension, obesity and a sedentary lifestyle.  Hypertension  This is a chronic problem. The current episode started more than 1 year ago. Pertinent negatives include no blurred vision, chest pain, headaches, palpitations or shortness of breath. Past treatments include diuretics. The current treatment provides moderate improvement.    Review of Systems  Constitutional: Negative for fatigue and unexpected weight change.  HENT: Negative for trouble swallowing and voice change.   Eyes: Negative for blurred vision and visual disturbance.  Respiratory: Negative for cough, shortness of breath and wheezing.   Cardiovascular: Negative for chest pain, palpitations and leg swelling.  Gastrointestinal: Negative for diarrhea, nausea and vomiting.  Endocrine: Negative for cold intolerance, heat intolerance, polydipsia, polyphagia and polyuria.  Musculoskeletal: Negative for arthralgias and myalgias.  Skin: Negative for color change, pallor, rash and wound.  Neurological: Negative for seizures and headaches.  Psychiatric/Behavioral: Negative for confusion and suicidal ideas.    Objective:    BP 134/86   Pulse 75   Ht 5\' 2"  (1.575 m)   Wt 176 lb 9.6 oz (80.1 kg)   LMP 04/30/2017   SpO2 98%   BMI 32.30 kg/m   Wt Readings from Last 3 Encounters:  05/02/17 176 lb 9.6 oz (80.1 kg)  04/28/17 175 lb 12 oz (79.7 kg)  02/28/17 174 lb (78.9 kg)    Physical Exam  Constitutional: She is oriented to person, place, and time. She appears well-developed.  HENT:  Head: Normocephalic and atraumatic.  Eyes: EOM are normal.  Neck: Normal range  of motion. Neck supple. No tracheal deviation present. No thyromegaly present.  Cardiovascular: Normal rate and regular rhythm.   Pulmonary/Chest: Effort normal and breath sounds normal.  Abdominal: Soft. Bowel sounds are normal. There is no tenderness. There is no guarding.  Musculoskeletal: Normal range of motion. She exhibits no edema.  Neurological: She is alert and oriented to person, place, and time. She has normal reflexes. No cranial nerve deficit. Coordination normal.  Skin: Skin is warm and dry. No rash noted. No erythema. No pallor.  Psychiatric: She has a normal mood and affect. Judgment normal.    Results for orders placed or performed in visit on 04/17/17  Lipid panel  Result Value Ref Range   Cholesterol 119 <200 mg/dL   HDL 33 (L) >50 mg/dL   Triglycerides 82 <150 mg/dL   LDL Cholesterol (Calc) 70 mg/dL (calc)   Total CHOL/HDL Ratio 3.6 <5.0 (calc)   Non-HDL Cholesterol (Calc) 86 <130 mg/dL (calc)  CBC  Result Value Ref Range   WBC 8.1 3.8 - 10.8 Thousand/uL   RBC 4.43 3.80 - 5.10 Million/uL   Hemoglobin 11.4 (L) 11.7 - 15.5 g/dL   HCT 35.3 35.0 - 45.0 %   MCV 79.7 (L) 80.0 - 100.0 fL   MCH 25.7 (L) 27.0 - 33.0 pg   MCHC 32.3 32.0 - 36.0 g/dL   RDW  12.5 11.0 - 15.0 %   Platelets 232 140 - 400 Thousand/uL   MPV 12.4 7.5 - 12.5 fL  TSH  Result Value Ref Range   TSH 0.57 mIU/L  Hepatic function panel  Result Value Ref Range   Total Protein 6.8 6.1 - 8.1 g/dL   Albumin 3.9 3.6 - 5.1 g/dL   Globulin 2.9 1.9 - 3.7 g/dL (calc)   AG Ratio 1.3 1.0 - 2.5 (calc)   Total Bilirubin 0.4 0.2 - 1.2 mg/dL   Bilirubin, Direct 0.1 0.0 - 0.2 mg/dL   Indirect Bilirubin 0.3 0.2 - 1.2 mg/dL (calc)   Alkaline phosphatase (APISO) 63 33 - 115 U/L   AST 12 10 - 30 U/L   ALT 11 6 - 29 U/L   Diabetic Labs (most recent): Lab Results  Component Value Date   HGBA1C 7.8 (H) 04/22/2017   HGBA1C 11.2 (H) 01/14/2017   HGBA1C 8.1 (H) 10/11/2016   Lipid Panel     Component Value  Date/Time   CHOL 119 04/22/2017 0808   TRIG 82 04/22/2017 0808   HDL 33 (L) 04/22/2017 0808   CHOLHDL 3.6 04/22/2017 0808   VLDL 13 10/11/2016 0758   LDLCALC 74 10/11/2016 0758     Assessment & Plan:   1. Diabetes mellitus type 2 in obese ([place])  - She came with significantly improved lisinopril profile and A1c of 7.8% improving from 11.2%.  - Patient remains at a high risk for more acute and chronic complications of diabetes which include CAD, CVA, CKD, retinopathy, and neuropathy. These are all discussed in detail with the patient. - She is responding to basal insulin with readings close to target . Recent labs reviewed. - I have re-counseled the patient on diet management and weight loss  by adopting a carbohydrate restricted / protein rich  Diet.  -Suggestion is made for her to avoid simple carbohydrates  from her diet including Cakes, Sweet Desserts, Ice Cream, Soda (diet and regular), Sweet Tea, Candies, Chips, Cookies, Store Bought Juices, Alcohol in Excess of  1-2 drinks a day, Artificial Sweeteners, and "Sugar-free" Products. This will help patient to have stable blood glucose profile and potentially avoid unintended weight gain.   - Patient is advised to stick to a routine mealtimes to eat 3 meals  a day and avoid unnecessary snacks ( to snack only to correct hypoglycemia).  - I have approached patient with the following individualized plan to manage diabetes and patient agrees.  - Based on her glycemic response, she will not require bolus insulin for now. - I will continue Lantus  30 units daily at bedtime, associated with strict monitoring of blood glucose 2 times a day-before   breakfast and at bedtime. -  I will continue Janumet 50/1000mg  po twice a day , therapeutically suitable for patient.  -Patient is encouraged to call clinic for blood glucose levels less than 70 or above 300 mg /dl. - Patient specific target  for A1c; LDL, HDL, Triglycerides, and  Waist  Circumference were discussed in detail.  2) BP/HTN: Controlled. Continue current medications. 3) Lipids/HPL:   controlled, LDL 74.  continue statins.  4)  Weight/Diet: CDE consult in progress, exercise, and carbohydrates information provided.  5) Vitamin D deficiency - She is status post therapy with vitamin D 50,000 units weekly for 12 weeks.  6) Chronic Care/Health Maintenance:  -Patient is on a Statin medications (allergic to ACE inhibitors) and encouraged to continue to follow up with Ophthalmology, Podiatrist at least yearly  or according to recommendations, and advised to  stay away from smoking. I have recommended yearly flu vaccine and pneumonia vaccination at least every 5 years; moderate intensity exercise for up to 150 minutes weekly; and  sleep for at least 7 hours a day.  - Time spent with the patient: 25 min, of which >50% was spent in reviewing her sugar logs , discussing her hypo- and hyper-glycemic episodes, reviewing her current and  previous labs and insulin doses and developing a plan to avoid hypo- and hyper-glycemia.    - I advised patient to maintain close follow up with Fayrene Helper, MD for primary care needs.     Follow up plan: -Return in about 3 months (around 08/01/2017) for follow up with pre-visit labs, meter, and logs.  Glade Lloyd, MD Phone: [phone]  Fax: [phone]  This note was partially dictated with voice recognition software. Similar sounding words can be transcribed inadequately or may not  be corrected upon review.  05/02/2017, 11:01 AM

## 2017-05-16 ENCOUNTER — Inpatient Hospital Stay: Admit: 2017-05-16 | Payer: BLUE CROSS/BLUE SHIELD | Admitting: Neurosurgery

## 2017-05-16 SURGERY — POSTERIOR LUMBAR FUSION 1 LEVEL
Anesthesia: General

## 2017-05-29 ENCOUNTER — Other Ambulatory Visit: Payer: Self-pay | Admitting: Family Medicine

## 2017-05-29 NOTE — Telephone Encounter (Signed)
Seen [phone] 

## 2017-06-29 ENCOUNTER — Other Ambulatory Visit: Payer: Self-pay | Admitting: Family Medicine

## 2017-06-29 ENCOUNTER — Other Ambulatory Visit: Payer: Self-pay | Admitting: "Endocrinology

## 2017-06-30 NOTE — Telephone Encounter (Signed)
Seen [phone] 

## 2017-07-26 LAB — COMPLETE METABOLIC PANEL WITH GFR
AG RATIO: 1.5 (calc) (ref 1.0–2.5)
ALT: 18 U/L (ref 6–29)
AST: 16 U/L (ref 10–30)
Albumin: 3.8 g/dL (ref 3.6–5.1)
Alkaline phosphatase (APISO): 65 U/L (ref 33–115)
BUN: 10 mg/dL (ref 7–25)
CALCIUM: 8.9 mg/dL (ref 8.6–10.2)
CO2: 31 mmol/L (ref 20–32)
CREATININE: 0.83 mg/dL (ref 0.50–1.10)
Chloride: 97 mmol/L — ABNORMAL LOW (ref 98–110)
GFR, EST AFRICAN AMERICAN: 102 mL/min/{1.73_m2} (ref >=60)
GFR, EST NON AFRICAN AMERICAN: 88 mL/min/{1.73_m2} (ref >=60)
GLUCOSE: 320 mg/dL — AB (ref 65–99)
Globulin: 2.6 g/dL (calc) (ref 1.9–3.7)
Potassium: 3.8 mmol/L (ref 3.5–5.3)
Sodium: 136 mmol/L (ref 135–146)
TOTAL PROTEIN: 6.4 g/dL (ref 6.1–8.1)
Total Bilirubin: 0.5 mg/dL (ref 0.2–1.2)

## 2017-07-26 LAB — MICROALBUMIN / CREATININE URINE RATIO
CREATININE, URINE: 95 mg/dL (ref 20–275)
MICROALB UR: 1.2 mg/dL
MICROALB/CREAT RATIO: 13 ug/mg{creat} (ref <30)

## 2017-07-26 LAB — HEMOGLOBIN A1C
HEMOGLOBIN A1C: 8.3 %{Hb} — AB (ref <5.7)
MEAN PLASMA GLUCOSE: 192 (calc)
eAG (mmol/L): 10.6 (calc)

## 2017-07-26 LAB — VITAMIN D 25 HYDROXY (VIT D DEFICIENCY, FRACTURES): Vit D, 25-Hydroxy: 25 ng/mL — ABNORMAL LOW (ref 30–100)

## 2017-08-01 ENCOUNTER — Encounter: Payer: Self-pay | Admitting: "Endocrinology

## 2017-08-01 ENCOUNTER — Ambulatory Visit (INDEPENDENT_AMBULATORY_CARE_PROVIDER_SITE_OTHER): Payer: BLUE CROSS/BLUE SHIELD | Admitting: "Endocrinology

## 2017-08-01 VITALS — BP 132/86 | HR 76 | Ht 62.0 in | Wt 180.0 lb

## 2017-08-01 DIAGNOSIS — I10 Essential (primary) hypertension: Secondary | ICD-10-CM

## 2017-08-01 DIAGNOSIS — E669 Obesity, unspecified: Secondary | ICD-10-CM | POA: Diagnosis not present

## 2017-08-01 DIAGNOSIS — E559 Vitamin D deficiency, unspecified: Secondary | ICD-10-CM | POA: Diagnosis not present

## 2017-08-01 DIAGNOSIS — E782 Mixed hyperlipidemia: Secondary | ICD-10-CM

## 2017-08-01 DIAGNOSIS — E1169 Type 2 diabetes mellitus with other specified complication: Secondary | ICD-10-CM | POA: Diagnosis not present

## 2017-08-01 MED ORDER — INSULIN GLARGINE 100 UNIT/ML SOLOSTAR PEN: 40.0000 [IU] | PEN_INJECTOR | Freq: Every day | SUBCUTANEOUS | 2 refills | Status: DC

## 2017-08-01 MED ORDER — VITAMIN D3 125 MCG (5000 UT) PO CAPS: 5000.0000 [IU] | ORAL_CAPSULE | Freq: Every day | ORAL | 0 refills | Status: DC

## 2017-08-01 NOTE — Progress Notes (Signed)
Subjective:    Patient ID: [name], female    DOB: June 26, 1976, PCP Fayrene Helper, MD   Past Medical History:  Diagnosis Date  . Constipation   . GERD (gastroesophageal reflux disease)   . Hypertension   . Morbid obesity ([place])   . NIDDM (non-insulin dependent diabetes mellitus) 2005   Type II   Past Surgical History:  Procedure Laterality Date  . BACK SURGERY  05/25/2010   Dr,Kabell   . CESAREAN SECTION  6/09, 2015  . LUMBAR LAMINECTOMY/DECOMPRESSION MICRODISCECTOMY Left 09/13/2015   Procedure: Left Lumbar five-Sacral one microdiskectomy;  Surgeon: Ashok Pall, MD;  Location: [facility];  Service: Neurosurgery;  Laterality: Left;  Left Lumbar five-Sacral one microdiskectomy   Social History   Socioeconomic History  . Marital status: Married    Spouse name: None  . Number of children: None  . Years of education: None  . Highest education level: None  Social Needs  . Financial resource strain: None  . Food insecurity - worry: None  . Food insecurity - inability: None  . Transportation needs - medical: None  . Transportation needs - non-medical: None  Occupational History  . Occupation: unemployed   Tobacco Use  . Smoking status: Never Smoker  . Smokeless tobacco: Never Used  Substance and Sexual Activity  . Alcohol use: No  . Drug use: No  . Sexual activity: Yes    Birth control/protection: None  Other Topics Concern  . None  Social History Narrative  . None   Outpatient Encounter Medications as of 08/01/2017  Medication Sig  . Cholecalciferol (VITAMIN D3) 5000 units CAPS Take 1 capsule (5,000 Units total) by mouth daily.  Marland Kitchen GLOBAL EASE INJECT PEN NEEDLES 31G X 8 MM MISC USE WITH LANTUS  . GLOBAL INJECT EASE LANCETS 28G MISC USE TO TEST BLOOD SUGAR TWICE DAILY  . hydrochlorothiazide (HYDRODIURIL) 25 MG tablet TAKE ONE TABLET BY MOUTH EVERY DAY  . Insulin Glargine (LANTUS SOLOSTAR) 100 UNIT/ML Solostar Pen Inject 40 Units into the skin  daily at 10 pm.  . JANUMET 50-1000 MG tablet TAKE 1 TABLET BY MOUTH 2 TIMES DAILY.  Marland Kitchen omeprazole (PRILOSEC) 20 MG capsule TAKE ONE CAPSULE BY MOUTH EVERY DAY  . pravastatin (PRAVACHOL) 10 MG tablet TAKE 1 TABLET BY MOUTH AT BEDTIME.  Marland Kitchen propranolol (INDERAL) 40 MG tablet TAKE 1 TABLET BY MOUTH 2 TIMES DAILY.  . [DISCONTINUED] Insulin Glargine (LANTUS SOLOSTAR) 100 UNIT/ML Solostar Pen Inject 30 Units into the skin daily at 10 pm.   No facility-administered encounter medications on file as of 08/01/2017.    ALLERGIES: Allergies  Allergen Reactions  . Ace Inhibitors Cough  . Amlodipine Besylate     REACTION: severe edema of legs   VACCINATION STATUS: Immunization History  Administered Date(s) Administered  . Influenza Split 05/20/2011, 05/17/2014  . Influenza Whole 06/09/2006, 05/02/2008, 03/06/2010  . Influenza,inj,Quad PF,6+ Mos 05/17/2015, 04/08/2016, 04/28/2017  . Pneumococcal Polysaccharide-23 08/14/2009, 10/15/2016  . Td 12/05/2009    Diabetes  She presents for her follow-up diabetic visit. She has type 2 diabetes mellitus. Onset time: She was diagnosed at age of 67 years. Her disease course has been worsening. There are no hypoglycemic associated symptoms. Pertinent negatives for hypoglycemia include no confusion, headaches, pallor or seizures. Pertinent negatives for diabetes include no blurred vision, no chest pain, no fatigue, no polydipsia, no polyphagia and no polyuria. There are no hypoglycemic complications. Symptoms are worsening. There are no diabetic complications. Risk factors for coronary  artery disease include diabetes mellitus, dyslipidemia, hypertension, obesity and sedentary lifestyle. Current diabetic treatment includes oral agent (dual therapy). She is compliant with treatment most of the time. Her weight is increasing steadily. She is following a generally unhealthy diet. She has had a previous visit with a dietitian. She never participates in exercise. Home blood  sugar record trend: Her A1c increased from 8.4% to 12 .1%. Her breakfast blood glucose range is generally 140-180 mg/dl. Her overall blood glucose range is 140-180 mg/dl. An ACE inhibitor/angiotensin II receptor blocker is contraindicated.  Hyperlipidemia  This is a chronic problem. The current episode started more than 1 year ago. Pertinent negatives include no chest pain, myalgias or shortness of breath. Current antihyperlipidemic treatment includes statins. Risk factors for coronary artery disease include dyslipidemia, diabetes mellitus, hypertension, obesity and a sedentary lifestyle.  Hypertension  This is a chronic problem. The current episode started more than 1 year ago. Pertinent negatives include no blurred vision, chest pain, headaches, palpitations or shortness of breath. Past treatments include diuretics. The current treatment provides moderate improvement.    Review of Systems  Constitutional: Negative for fatigue and unexpected weight change.  HENT: Negative for trouble swallowing and voice change.   Eyes: Negative for blurred vision and visual disturbance.  Respiratory: Negative for cough, shortness of breath and wheezing.   Cardiovascular: Negative for chest pain, palpitations and leg swelling.  Gastrointestinal: Negative for diarrhea, nausea and vomiting.  Endocrine: Negative for cold intolerance, heat intolerance, polydipsia, polyphagia and polyuria.  Musculoskeletal: Negative for arthralgias and myalgias.  Skin: Negative for color change, pallor, rash and wound.  Neurological: Negative for seizures and headaches.  Psychiatric/Behavioral: Negative for confusion and suicidal ideas.    Objective:    BP 132/86   Pulse 76   Ht 5\' 2"  (1.575 m)   Wt 180 lb (81.6 kg)   BMI 32.92 kg/m   Wt Readings from Last 3 Encounters:  08/01/17 180 lb (81.6 kg)  05/02/17 176 lb 9.6 oz (80.1 kg)  04/28/17 175 lb 12 oz (79.7 kg)    Physical Exam  Constitutional: She is oriented to  person, place, and time. She appears well-developed.  HENT:  Head: Normocephalic and atraumatic.  Eyes: EOM are normal.  Neck: Normal range of motion. Neck supple. No tracheal deviation present. No thyromegaly present.  Cardiovascular: Normal rate and regular rhythm.  Pulmonary/Chest: Effort normal and breath sounds normal.  Abdominal: Soft. Bowel sounds are normal. There is no tenderness. There is no guarding.  Musculoskeletal: Normal range of motion. She exhibits no edema.  Neurological: She is alert and oriented to person, place, and time. She has normal reflexes. No cranial nerve deficit. Coordination normal.  Skin: Skin is warm and dry. No rash noted. No erythema. No pallor.  Psychiatric: She has a normal mood and affect. Judgment normal.    Results for orders placed or performed in visit on 05/02/17  COMPLETE METABOLIC PANEL WITH GFR  Result Value Ref Range   Glucose, Bld 320 (H) 65 - 99 mg/dL   BUN 10 7 - 25 mg/dL   Creat 0.83 0.50 - 1.10 mg/dL   GFR, Est Non African American 88 > OR = 60 mL/min/1.51m2   GFR, Est African American 102 > OR = 60 mL/min/1.39m2   BUN/Creatinine Ratio NOT APPLICABLE 6 - 22 (calc)   Sodium 136 135 - 146 mmol/L   Potassium 3.8 3.5 - 5.3 mmol/L   Chloride 97 (L) 98 - 110 mmol/L   CO2 31 20 -  32 mmol/L   Calcium 8.9 8.6 - 10.2 mg/dL   Total Protein 6.4 6.1 - 8.1 g/dL   Albumin 3.8 3.6 - 5.1 g/dL   Globulin 2.6 1.9 - 3.7 g/dL (calc)   AG Ratio 1.5 1.0 - 2.5 (calc)   Total Bilirubin 0.5 0.2 - 1.2 mg/dL   Alkaline phosphatase (APISO) 65 33 - 115 U/L   AST 16 10 - 30 U/L   ALT 18 6 - 29 U/L  Hemoglobin A1c  Result Value Ref Range   Hgb A1c MFr Bld 8.3 (H) <5.7 % of total Hgb   Mean Plasma Glucose 192 (calc)   eAG (mmol/L) 10.6 (calc)  Microalbumin / creatinine urine ratio  Result Value Ref Range   Creatinine, Urine 95 20 - 275 mg/dL   Microalb, Ur 1.2 mg/dL   Microalb Creat Ratio 13 <30 mcg/mg creat  VITAMIN D 25 Hydroxy (Vit-D Deficiency,  Fractures)  Result Value Ref Range   Vit D, 25-Hydroxy 25 (L) 30 - 100 ng/mL   Diabetic Labs (most recent): Lab Results  Component Value Date   HGBA1C 8.3 (H) 07/25/2017   HGBA1C 7.8 (H) 04/22/2017   HGBA1C 11.2 (H) 01/14/2017   Lipid Panel     Component Value Date/Time   CHOL 119 04/22/2017 0808   TRIG 82 04/22/2017 0808   HDL 33 (L) 04/22/2017 0808   CHOLHDL 3.6 04/22/2017 0808   VLDL 13 10/11/2016 0758   LDLCALC 74 10/11/2016 0758     Assessment & Plan:   1. Diabetes mellitus type 2 in obese ([place])  - She came with  above target blood glucose profile, A1c increasing to 8.3% from 7.8% after generally improving from 11.2%.   - Patient remains at a high risk for more acute and chronic complications of diabetes which include CAD, CVA, CKD, retinopathy, and neuropathy. These are all discussed in detail with the patient. - She is responding to basal insulin . Recent labs reviewed. - I have re-counseled the patient on diet management and weight loss  by adopting a carbohydrate restricted / protein rich  Diet.  -  Suggestion is made for her to avoid simple carbohydrates  from her diet including Cakes, Sweet Desserts / Pastries, Ice Cream, Soda (diet and regular), Sweet Tea, Candies, Chips, Cookies, Store Bought Juices, Alcohol in Excess of  1-2 drinks a day, Artificial Sweeteners, and "Sugar-free" Products. This will help patient to have stable blood glucose profile and potentially avoid unintended weight gain.   - Patient is advised to stick to a routine mealtimes to eat 3 meals  a day and avoid unnecessary snacks ( to snack only to correct hypoglycemia).  - I have approached patient with the following individualized plan to manage diabetes and patient agrees.  - Based on her glycemic response, she will not require bolus insulin for now. - I will increase  Lantus  to 40 units daily at bedtime, associated with strict monitoring of blood glucose 2 times a day-before  breakfast and  at bedtime. -  I will continue Janumet 50/1000mg  po twice a day , therapeutically suitable for patient.  -Patient is encouraged to call clinic for blood glucose levels less than 70 or above 300 mg /dl. - Patient specific target  for A1c; LDL, HDL, Triglycerides, and  Waist Circumference were discussed in detail.  2) BP/HTN: Controlled. I advised her to continue her current blood pressure medications including hydrochlorothiazide.  She is documented to be allergic to ACE inhibitor's.  3) Lipids/HPL:  controlled, LDL 70.  continue statins.  4)  Weight/Diet: CDE consult in progress, exercise, and carbohydrates information provided.  5) Vitamin D deficiency -   her vitamin D still low although improving. She is status post therapy with vitamin D 50,000 units weekly for 12 weeks. I will prescribe vitamin D3 5000 units by mouth daily for the next 90 days.  6) Chronic Care/Health Maintenance:  -Patient is on a Statin medications (allergic to ACE inhibitors) and encouraged to continue to follow up with Ophthalmology, Podiatrist at least yearly or according to recommendations, and advised to  stay away from smoking. I have recommended yearly flu vaccine and pneumonia vaccination at least every 5 years; moderate intensity exercise for up to 150 minutes weekly; and  sleep for at least 7 hours a day.  - Time spent with the patient: 25 min, of which >50% was spent in reviewing her sugar logs , discussing her hypo- and hyper-glycemic episodes, reviewing her current and  previous labs and insulin doses and developing a plan to avoid hypo- and hyper-glycemia.    - I advised patient to maintain close follow up with Fayrene Helper, MD for primary care needs.    Follow up plan: -Return in about 4 months (around 11/30/2017) for follow up with pre-visit labs, meter, and logs.  Glade Lloyd, MD Phone: [phone]  Fax: [phone]  This note was partially dictated with voice recognition software. Similar  sounding words can be transcribed inadequately or may not  be corrected upon review.  08/01/2017, 10:11 AM

## 2017-08-01 NOTE — Patient Instructions (Signed)

## 2017-08-21 ENCOUNTER — Encounter: Payer: Self-pay | Admitting: Family Medicine

## 2017-08-21 ENCOUNTER — Ambulatory Visit (INDEPENDENT_AMBULATORY_CARE_PROVIDER_SITE_OTHER): Payer: BLUE CROSS/BLUE SHIELD | Admitting: Family Medicine

## 2017-08-21 VITALS — BP 112/82 | HR 72 | Resp 16 | Ht 62.0 in | Wt 182.0 lb

## 2017-08-21 DIAGNOSIS — E1169 Type 2 diabetes mellitus with other specified complication: Secondary | ICD-10-CM | POA: Diagnosis not present

## 2017-08-21 DIAGNOSIS — I10 Essential (primary) hypertension: Secondary | ICD-10-CM | POA: Diagnosis not present

## 2017-08-21 DIAGNOSIS — G5601 Carpal tunnel syndrome, right upper limb: Secondary | ICD-10-CM

## 2017-08-21 DIAGNOSIS — E669 Obesity, unspecified: Secondary | ICD-10-CM

## 2017-08-21 DIAGNOSIS — E782 Mixed hyperlipidemia: Secondary | ICD-10-CM

## 2017-08-21 MED ORDER — GABAPENTIN 300 MG PO CAPS: 300.0000 mg | ORAL_CAPSULE | Freq: Every day | ORAL | 3 refills | Status: DC

## 2017-08-21 NOTE — Progress Notes (Signed)
ERABELLA KUIPERS     MRN: [id number]      DOB: [birth year]   HPI Ms. Maduro is here for follow up and re-evaluation of chronic medical conditions, medication management and review of any available recent lab and radiology data.  Preventive health is updated, specifically  Cancer screening and Immunization.   Blood sugar control is still not where it should be , but she continues to work with endocrinology. Reports diligence in food choice and exercise. Denies polyuria, polydipsia, blurred vision , or hypoglycemic episodes.  The PT denies any adverse reactions to current medications since the last visit.  2 month h/o right hand pain and numbness, with weakness, and poor grip, similar presentation in pregnancy  ROS Denies recent fever or chills. Denies sinus pressure, nasal congestion, ear pain or sore throat. Denies chest congestion, productive cough or wheezing. Denies chest pains, palpitations and leg swelling Denies abdominal pain, nausea, vomiting,diarrhea or constipation.   Denies dysuria, frequency, hesitancy or incontinence.  Denies headaches, seizures, numbness, or tingling. Denies depression, anxiety or insomnia. Denies skin break down or rash.   PE  BP 112/82   Pulse 72   Resp 16   Ht 5\' 2"  (1.575 m)   Wt 182 lb (82.6 kg)   SpO2 98%   BMI 33.29 kg/m   Patient alert and oriented and in no cardiopulmonary distress.  HEENT: No facial asymmetry, EOMI,   oropharynx pink and moist.  Neck supple no JVD, no mass.  Chest: Clear to auscultation bilaterally.  CVS: S1, S2 no murmurs, no S3.Regular rate.  ABD: Soft non tender.   Ext: No edema  MS: Adequate ROM spine, shoulders, hips and knees.Grade 4 power in right grip, positive tinnel's  Skin: Intact, no ulcerations or rash noted.  Psych: Good eye contact, normal affect. Memory intact not anxious or depressed appearing.  CNS: CN 2-12 intact, power,  normal throughout.no focal deficits noted.   Assessment  & Plan  Severe carpal tunnel syndrome of right wrist 2 month history , had similar in pregnancy 3 years ago, ortho referral and gabapentin  Diabetes mellitus type 2 in obese Ms. Cremeans is reminded of the importance of commitment to daily physical activity for 30 minutes or more, as able and the need to limit carbohydrate intake to 30 to 60 grams per meal to help with blood sugar control.  Deteriorated, managed by endo  The need to take medication as prescribed, test blood sugar as directed, and to call between visits if there is a concern that blood sugar is uncontrolled is also discussed.   Ms. Helbert is reminded of the importance of daily foot exam, annual eye examination, and good blood sugar, blood pressure and cholesterol control. Incr ease exercise days and duration Diabetic Labs Latest Ref Rng & Units 07/25/2017 04/22/2017 01/14/2017 10/16/2016 10/11/2016  HbA1c <5.7 % of total Hgb 8.3(H) 7.8(H) 11.2(H) - 8.1(H)  Microalbumin mg/dL 1.2 - - 35.2(H) -  Micro/Creat Ratio <30 mcg/mg creat 13 - - 23.1 -  Chol <200 mg/dL - 119 - - 122  HDL >50 mg/dL - 33(L) - - 35(L)  Calc LDL <100 mg/dL - - - - 74  Triglycerides <150 mg/dL - 82 - - 65  Creatinine 0.50 - 1.10 mg/dL 0.83 0.80 0.97 - 0.96   BP/Weight 08/21/2017 08/01/2017 05/02/2017 04/28/2017 02/28/2017 01/24/2017 11/27/4079  Systolic BP 448 185 631 497 026 378 588  Diastolic BP 82 86 86 76 76 80 79  Wt. (Lbs) 182 180 176.6  175.75 174 172 179  BMI 33.29 32.92 32.3 32.15 31.83 31.46 32.74   Foot/eye exam completion dates Latest Ref Rng & Units 10/15/2016 09/19/2016  Eye Exam No Retinopathy - No Retinopathy  Foot exam Order - - -  Foot Form Completion - Done -    Essential hypertension Controlled, no change in medication DASH diet and commitment to daily physical activity for a minimum of 30 minutes discussed and encouraged, as a part of hypertension management. The importance of attaining a healthy weight is also discussed.  BP/Weight  08/27/2017 08/21/2017 08/01/2017 05/02/2017 04/28/2017 02/28/2017 1/49/7026  Systolic BP 378 588 502 774 128 786 767  Diastolic BP 76 82 86 86 76 76 80  Wt. (Lbs) 182 182 180 176.6 175.75 174 172  BMI 33.29 33.29 32.92 32.3 32.15 31.83 31.46       Mixed hyperlipidemia Hyperlipidemia:Low fat diet discussed and encouraged.   Lipid Panel  Lab Results  Component Value Date   CHOL 119 04/22/2017   HDL 33 (L) 04/22/2017   LDLCALC 74 10/11/2016   TRIG 82 04/22/2017   CHOLHDL 3.6 04/22/2017   Updated lab needed at/ before next visit.     Obesity, Class II, BMI 35-39.9, with comorbidity Deteriorated. Patient re-educated about  the importance of commitment to a  minimum of 150 minutes of exercise per week.  The importance of healthy food choices with portion control discussed. Encouraged to start a food diary, count calories and to consider  joining a support group. Sample diet sheets offered. Goals set by the patient for the next several months.   Weight /BMI 08/27/2017 08/21/2017 08/01/2017  WEIGHT 182 lb 182 lb 180 lb  HEIGHT 5\' 2"  5\' 2"  5\' 2"   BMI 33.29 kg/m2 33.29 kg/m2 32.92 kg/m2

## 2017-08-21 NOTE — Assessment & Plan Note (Signed)
2 month history , had similar in pregnancy 3 years ago, ortho referral and gabapentin

## 2017-08-21 NOTE — Patient Instructions (Signed)
F/u as before, call if you need me sooner  You need to start gabapentin at night for nerve pain and get a hand brace for carpal tunnel  You are referred to orthopedics for evaluation and treatment  It is important that you exercise regularly at least 30 minutes 6 times a week. If you develop chest pain, have severe difficulty breathing, or feel very tired, stop exercising immediately and seek medical attention   All the best for 2019

## 2017-08-21 NOTE — Assessment & Plan Note (Addendum)
[name] is reminded of the importance of commitment to daily physical activity for 30 minutes or more, as able and the need to limit carbohydrate intake to 30 to 60 grams per meal to help with blood sugar control.  Deteriorated, managed by endo  The need to take medication as prescribed, test blood sugar as directed, and to call between visits if there is a concern that blood sugar is uncontrolled is also discussed.   [name] is reminded of the importance of daily foot exam, annual eye examination, and good blood sugar, blood pressure and cholesterol control. Incr ease exercise days and duration Diabetic Labs Latest Ref Rng & Units 07/25/2017 04/22/2017 01/14/2017 10/16/2016 10/11/2016  HbA1c <5.7 % of total Hgb 8.3(H) 7.8(H) 11.2(H) - 8.1(H)  Microalbumin mg/dL 1.2 - - 35.2(H) -  Micro/Creat Ratio <30 mcg/mg creat 13 - - 23.1 -  Chol <200 mg/dL - 119 - - 122  HDL >50 mg/dL - 33(L) - - 35(L)  Calc LDL <100 mg/dL - - - - 74  Triglycerides <150 mg/dL - 82 - - 65  Creatinine 0.50 - 1.10 mg/dL 0.83 0.80 0.97 - 0.96   BP/Weight 08/21/2017 08/01/2017 05/02/2017 04/28/2017 02/28/2017 01/24/2017 3/00/9233  Systolic BP 007 622 633 354 562 563 893  Diastolic BP 82 86 86 76 76 80 79  Wt. (Lbs) 182 180 176.6 175.75 174 172 179  BMI 33.29 32.92 32.3 32.15 31.83 31.46 32.74   Foot/eye exam completion dates Latest Ref Rng & Units 10/15/2016 09/19/2016  Eye Exam No Retinopathy - No Retinopathy  Foot exam Order - - -  Foot Form Completion - Done -

## 2017-08-27 ENCOUNTER — Ambulatory Visit (INDEPENDENT_AMBULATORY_CARE_PROVIDER_SITE_OTHER): Payer: Self-pay

## 2017-08-27 ENCOUNTER — Ambulatory Visit (INDEPENDENT_AMBULATORY_CARE_PROVIDER_SITE_OTHER): Payer: BLUE CROSS/BLUE SHIELD | Admitting: Orthopaedic Surgery

## 2017-08-27 ENCOUNTER — Encounter (INDEPENDENT_AMBULATORY_CARE_PROVIDER_SITE_OTHER): Payer: Self-pay | Admitting: Orthopaedic Surgery

## 2017-08-27 VITALS — BP 120/76 | HR 80 | Ht 62.0 in | Wt 182.0 lb

## 2017-08-27 DIAGNOSIS — M79641 Pain in right hand: Secondary | ICD-10-CM | POA: Diagnosis not present

## 2017-08-27 NOTE — Progress Notes (Deleted)
Patient presents with right hand pain over the past couple of months. She denies any known injury. She is losing strength in her hand and notices that she drops things when trying to pick them up. She also experiences tingling. She is right hand dominate and is currently taking Gabapentin 300mg  at night with some relief in morning irritation.

## 2017-08-27 NOTE — Progress Notes (Signed)
Office Visit Note   Patient: [name]           Date of Birth: 1975-10-16           MRN: [id number] Visit Date: 08/27/2017              Requested by: Fayrene Helper, [address], [place] [address] PCP: Fayrene Helper, MD   Assessment & Plan: Visit Diagnoses:  1. Pain in right hand     Plan: Probable carpal tunnel syndrome right hand. We'll apply a volar wrist splint. Has been taking gabapentin which she can use at night and supplement this with NSAIDs. Reevaluate in 3-4 weeks. Consider nerve conduction studies and EMGs  Follow-Up Instructions: No Follow-up on file.   Orders:  Orders Placed This Encounter  Procedures  . XR Wrist Complete Right   No orders of the defined types were placed in this encounter.     Procedures: No procedures performed   Clinical Data: No additional findings.   Subjective: Chief Complaint  Patient presents with  . Right Hand - Pain  Patient presents with right hand pain over the past couple of months. She denies any known injury. She is losing strength in her hand and notices that she drops things when trying to pick them up. She also experiences tingling. She is right hand dominate and is currently taking Gabapentin 300mg  at night with some relief in morning irritation.  Had similar problem about 3 years ago during the pregnancy of her son.  HPI  Review of Systems   Objective: Vital Signs: BP 120/76   Pulse 80   Ht 5\' 2"  (1.575 m)   Wt 182 lb (82.6 kg)   BMI 33.29 kg/m   Physical Exam  Ortho Exam awake alert and oriented 3. Comfortable sitting. Good grip and release left right hand. No swelling of right hand compared to left. Minimal Tinel's but positive Phalen's. Good opposition of thumb to little finger. Skin intact. Good capillary refill.  Specialty Comments:  No specialty comments available.  Imaging: No results found.   PMFS History: Patient Active Problem List   Diagnosis Date Noted  . Severe carpal tunnel syndrome of right wrist 08/21/2017  . HNP (herniated nucleus pulposus), lumbar 09/13/2015  . Vitamin D deficiency 07/14/2015  . Mixed hyperlipidemia 11/07/2014  . GERD (gastroesophageal reflux disease) 07/14/2014  . Back pain with left-sided sciatica 03/28/2010  . Obesity, Class II, BMI 35-39.9, with comorbidity 08/22/2009  . Diabetes mellitus type 2 in obese ([place]) 09/17/2006  . Essential hypertension 09/17/2006   Past Medical History:  Diagnosis Date  . Constipation   . GERD (gastroesophageal reflux disease)   . Hypertension   . Morbid obesity ([place])   . NIDDM (non-insulin dependent diabetes mellitus) 2005   Type II    Family History  Problem Relation Age of Onset  . Diabetes Father   . Hypertension Father   . Kidney disease Father   . Kidney failure Father   . Diabetes Mother   . Hypertension Mother     Past Surgical History:  Procedure Laterality Date  . BACK SURGERY  05/25/2010   Dr,Kabell   . CESAREAN SECTION  6/09, 2015  . LUMBAR LAMINECTOMY/DECOMPRESSION MICRODISCECTOMY Left 09/13/2015   Procedure: Left Lumbar five-Sacral one microdiskectomy;  Surgeon: Ashok Pall, MD;  Location: [facility];  Service: Neurosurgery;  Laterality: Left;  Left Lumbar five-Sacral one microdiskectomy   Social History   Occupational History  .  Occupation: unemployed   Tobacco Use  . Smoking status: Never Smoker  . Smokeless tobacco: Never Used  Substance and Sexual Activity  . Alcohol use: No  . Drug use: No  . Sexual activity: Yes    Birth control/protection: None

## 2017-08-31 ENCOUNTER — Encounter: Payer: Self-pay | Admitting: Family Medicine

## 2017-08-31 NOTE — Assessment & Plan Note (Signed)
Deteriorated. Patient re-educated about  the importance of commitment to a  minimum of 150 minutes of exercise per week.  The importance of healthy food choices with portion control discussed. Encouraged to start a food diary, count calories and to consider  joining a support group. Sample diet sheets offered. Goals set by the patient for the next several months.   Weight /BMI 08/27/2017 08/21/2017 08/01/2017  WEIGHT 182 lb 182 lb 180 lb  HEIGHT 5\' 2"  5\' 2"  5\' 2"   BMI 33.29 kg/m2 33.29 kg/m2 32.92 kg/m2

## 2017-08-31 NOTE — Assessment & Plan Note (Signed)
Controlled, no change in medication DASH diet and commitment to daily physical activity for a minimum of 30 minutes discussed and encouraged, as a part of hypertension management. The importance of attaining a healthy weight is also discussed.  BP/Weight 08/27/2017 08/21/2017 08/01/2017 05/02/2017 04/28/2017 02/28/2017 9/60/4540  Systolic BP 981 191 478 295 621 308 657  Diastolic BP 76 82 86 86 76 76 80  Wt. (Lbs) 182 182 180 176.6 175.75 174 172  BMI 33.29 33.29 32.92 32.3 32.15 31.83 31.46

## 2017-08-31 NOTE — Assessment & Plan Note (Signed)
Hyperlipidemia:Low fat diet discussed and encouraged.   Lipid Panel  Lab Results  Component Value Date   CHOL 119 04/22/2017   HDL 33 (L) 04/22/2017   LDLCALC 74 10/11/2016   TRIG 82 04/22/2017   CHOLHDL 3.6 04/22/2017   Updated lab needed at/ before next visit.

## 2017-09-16 ENCOUNTER — Encounter: Payer: Self-pay | Admitting: Family Medicine

## 2017-09-16 ENCOUNTER — Ambulatory Visit (INDEPENDENT_AMBULATORY_CARE_PROVIDER_SITE_OTHER): Payer: BLUE CROSS/BLUE SHIELD | Admitting: Family Medicine

## 2017-09-16 ENCOUNTER — Ambulatory Visit (HOSPITAL_COMMUNITY)
Admission: RE | Admit: 2017-09-16 | Discharge: 2017-09-16 | Disposition: A | Discharge status: Home / Self Care | Payer: BLUE CROSS/BLUE SHIELD | Source: Ambulatory Visit | Attending: Family Medicine | Admitting: Family Medicine

## 2017-09-16 VITALS — BP 114/82 | HR 86 | Resp 16 | Ht 62.0 in | Wt 176.0 lb

## 2017-09-16 DIAGNOSIS — E669 Obesity, unspecified: Secondary | ICD-10-CM

## 2017-09-16 DIAGNOSIS — R0789 Other chest pain: Secondary | ICD-10-CM | POA: Diagnosis not present

## 2017-09-16 DIAGNOSIS — Z566 Other physical and mental strain related to work: Secondary | ICD-10-CM

## 2017-09-16 DIAGNOSIS — E1169 Type 2 diabetes mellitus with other specified complication: Secondary | ICD-10-CM | POA: Diagnosis not present

## 2017-09-16 DIAGNOSIS — J90 Pleural effusion, not elsewhere classified: Secondary | ICD-10-CM

## 2017-09-16 DIAGNOSIS — Z09 Encounter for follow-up examination after completed treatment for conditions other than malignant neoplasm: Secondary | ICD-10-CM

## 2017-09-16 MED ORDER — GLIPIZIDE ER 10 MG PO TB24: ORAL_TABLET | ORAL | 2 refills | Status: DC

## 2017-09-16 NOTE — Patient Instructions (Addendum)
Keep f/u as before  Start glipizide ONE daily until you return although prescribed as 2 daily. You may call week of 2/21 if blood sugar still high, test once daily and record  You are referred to cardiology  Please get CXR today  You need to reconsider the stresses in your life

## 2017-09-16 NOTE — Progress Notes (Signed)
[name]     MRN: [id number]      DOB: 09-Oct-1975   HPI [name] is here for f/u from ed 1 week ago when she presented with acute vertigo, main abnormality was small bilateral pleural effusions noted on CT angiogram on study at [place] on 09/09/2017, needs f/u study Pt presented with a 2 day h/o chest pain / pressure felt like a fist over her sternum and right chest, has noted exertional fatigue x 1 week, has stared taking 4 aspirins daily, takkes this every other day, though states not having chest  Chest pain occurred when she was sititing , also when she walked around , non radiating , felt light headed and felt her heart racing , on Tuesday lasted a few hours. 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 Denies sinus pressure, nasal congestion, ear pain or sore throat. Denies chest congestion, productive cough or wheezing.  Denies abdominal pain, nausea, vomiting,diarrhea or constipation.   Denies dysuria, frequency, hesitancy or incontinence. Denies joint pain, swelling and limitation in mobility. Denies headaches, seizures, numbness, or tingling.  Denies skin break down or rash.   PE  BP 114/82   Pulse 86   Resp 16   Ht 5\' 2"  (1.575 m)   Wt 176 lb (79.8 kg)   SpO2 98%   BMI 32.19 kg/m   Patient alert and oriented and in no cardiopulmonary distress.  HEENT: No  facial asymmetry, EOMI,   oropharynx pink and moist.  Neck supple no JVD, no mass.  Chest: Clear to auscultation bilaterally.  CVS: S1, S2 no murmurs, no S3.Regular rate.  ABD: Soft non tender.   Ext: No edema  MS: Adequate ROM spine, shoulders, hips and knees.  Skin: Intact, no ulcerations or rash noted.  Psych: Good eye contact, normal affect. Memory intact mildly  anxious or depressed appearing.  CNS: CN 2-12 intact, power,  normal throughout.no focal deficits noted.   Assessment & Plan  Chronic bilateral pleural effusions recently sen on CXR at [facility] ED needs rept , pt denies SOB or cough  Diabetes mellitus type 2 in obese Uncontrolled, change in managamernt, start glipizide one daily, and may need to increase to 2 daily, she is to call in,  with close follow up and rept hBA1C when due [name] is reminded of the importance of commitment to daily physical activity for 30 minutes or more, as able and the need to limit carbohydrate intake to 30 to 60 grams per meal to help with blood sugar control.   The need to take medication as prescribed, test blood sugar as directed, and to call between visits if there is a concern that blood sugar is uncontrolled is also discussed.   [name] is reminded of the importance of daily foot exam, annual eye examination, and good blood sugar, blood  pressure and cholesterol control.  Diabetic Labs Latest Ref Rng & Units 07/25/2017 04/22/2017 01/14/2017 10/16/2016 10/11/2016  HbA1c <5.7 % of total Hgb 8.3(H) 7.8(H) 11.2(H) - 8.1(H)  Microalbumin mg/dL 1.2 - - 35.2(H) -  Micro/Creat Ratio <30 mcg/mg creat 13 - - 23.1 -  Chol <200 mg/dL - 119 - - 122  HDL >50 mg/dL - 33(L) - - 35(L)  Calc LDL <100 mg/dL - - - - 74  Triglycerides <150 mg/dL - 82 - - 65  Creatinine 0.50 - 1.10 mg/dL 0.83 0.80 0.97 - 0.96   BP/Weight 09/16/2017 08/27/2017 08/21/2017 08/01/2017 05/02/2017 04/28/2017 1/61/0960  Systolic BP 454 098 119 147 829 562 130  Diastolic BP 82 76  82 86 86 76 76  Wt. (Lbs) 176 182 182 180 176.6 175.75 174  BMI 32.19 33.29 33.29 32.92 32.3 32.15 31.83   Foot/eye exam completion dates Latest Ref Rng & Units 10/15/2016 09/19/2016  Eye Exam No Retinopathy - No Retinopathy  Foot exam Order - - -  Foot Form Completion - Done -         Chest pain Non specific chest pain in patient with multiple CV risk factors, including uncontrolled IDDM, and hyperlipidemia and hypertension, refer pt to cardiology for baseline evaluation for CAD.   Stress at work Pt recently started a new job which is a likely contributor to the chest pain she presented to the ED wih, I have discussed seriously considering a different work Environmental manager for examination following treatment at hospital Evaluated for chest pain on 09/09/2017 for chest pain at [facility] ED , deemed non cardiac, now here for f/u, due to increased cV risk will refer to cardiology Denies current symptoms. CXR shows pleural effusions will rept

## 2017-09-23 DIAGNOSIS — J90 Pleural effusion, not elsewhere classified: Secondary | ICD-10-CM | POA: Insufficient documentation

## 2017-09-23 DIAGNOSIS — Z09 Encounter for follow-up examination after completed treatment for conditions other than malignant neoplasm: Secondary | ICD-10-CM | POA: Insufficient documentation

## 2017-09-23 DIAGNOSIS — Z566 Other physical and mental strain related to work: Secondary | ICD-10-CM | POA: Insufficient documentation

## 2017-09-23 DIAGNOSIS — R079 Chest pain, unspecified: Secondary | ICD-10-CM | POA: Insufficient documentation

## 2017-09-23 DIAGNOSIS — Z1211 Encounter for screening for malignant neoplasm of colon: Secondary | ICD-10-CM | POA: Insufficient documentation

## 2017-09-23 NOTE — Assessment & Plan Note (Signed)
Evaluated for chest pain on 09/09/2017 for chest pain at [facility] ED , deemed non cardiac, now here for f/u, due to increased cV risk will refer to cardiology Denies current symptoms. CXR shows pleural effusions will rept

## 2017-09-23 NOTE — Assessment & Plan Note (Signed)
Pt recently started a new job which is a likely contributor to the chest pain she presented to the ED wih, I have discussed seriously considering a different work environment

## 2017-09-23 NOTE — Assessment & Plan Note (Signed)
recently sen on CXR at [facility] ED needs rept , pt denies SOB or cough

## 2017-09-23 NOTE — Assessment & Plan Note (Addendum)
Uncontrolled, change in managamernt, start glipizide one daily, and may need to increase to 2 daily, she is to call in,  with close follow up and rept hBA1C when due Ms. Reierson is reminded of the importance of commitment to daily physical activity for 30 minutes or more, as able and the need to limit carbohydrate intake to 30 to 60 grams per meal to help with blood sugar control.   The need to take medication as prescribed, test blood sugar as directed, and to call between visits if there is a concern that blood sugar is uncontrolled is also discussed.   Ms. Ramanathan is reminded of the importance of daily foot exam, annual eye examination, and good blood sugar, blood pressure and cholesterol control.  Diabetic Labs Latest Ref Rng & Units 07/25/2017 04/22/2017 01/14/2017 10/16/2016 10/11/2016  HbA1c <5.7 % of total Hgb 8.3(H) 7.8(H) 11.2(H) - 8.1(H)  Microalbumin mg/dL 1.2 - - 35.2(H) -  Micro/Creat Ratio <30 mcg/mg creat 13 - - 23.1 -  Chol <200 mg/dL - 119 - - 122  HDL >50 mg/dL - 33(L) - - 35(L)  Calc LDL <100 mg/dL - - - - 74  Triglycerides <150 mg/dL - 82 - - 65  Creatinine 0.50 - 1.10 mg/dL 0.83 0.80 0.97 - 0.96   BP/Weight 09/16/2017 08/27/2017 08/21/2017 08/01/2017 05/02/2017 04/28/2017 4/38/3818  Systolic BP 403 754 360 677 034 035 248  Diastolic BP 82 76 82 86 86 76 76  Wt. (Lbs) 176 182 182 180 176.6 175.75 174  BMI 32.19 33.29 33.29 32.92 32.3 32.15 31.83   Foot/eye exam completion dates Latest Ref Rng & Units 10/15/2016 09/19/2016  Eye Exam No Retinopathy - No Retinopathy  Foot exam Order - - -  Foot Form Completion - Done -

## 2017-09-23 NOTE — Assessment & Plan Note (Signed)
Non specific chest pain in patient with multiple CV risk factors, including uncontrolled IDDM, and hyperlipidemia and hypertension, refer pt to cardiology for baseline evaluation for CAD.

## 2017-09-24 ENCOUNTER — Encounter (INDEPENDENT_AMBULATORY_CARE_PROVIDER_SITE_OTHER): Payer: Self-pay | Admitting: Orthopaedic Surgery

## 2017-09-24 ENCOUNTER — Ambulatory Visit (INDEPENDENT_AMBULATORY_CARE_PROVIDER_SITE_OTHER): Payer: BLUE CROSS/BLUE SHIELD | Admitting: Orthopaedic Surgery

## 2017-09-24 DIAGNOSIS — G5601 Carpal tunnel syndrome, right upper limb: Secondary | ICD-10-CM

## 2017-09-24 NOTE — Progress Notes (Signed)
Office Visit Note   Patient: [name]           Date of Birth: 1976/02/06           MRN: [id number] Visit Date: 09/24/2017              Requested by: Fayrene Helper, [address], [place], [place] [address] PCP: Fayrene Helper, MD   Assessment & Plan: Visit Diagnoses:  1. Carpal tunnel syndrome, right upper limb     Plan: Two-week follow-up and feeling much better the combination of the volar wrist splint NSAIDs and gabapentin. No longer having numbness or tingling or any weakness. May remove the splint and we'll plan to see her back as needed.  Follow-Up Instructions: Return if symptoms worsen or fail to improve.   Orders:  No orders of the defined types were placed in this encounter.  No orders of the defined types were placed in this encounter.     Procedures: No procedures performed   Clinical Data: No additional findings.   Subjective: Chief Complaint  Patient presents with  . Right Wrist - Follow-up    [name] is a 41 y o here for F/U Right wrist pain. She has been wearing the splint all the time but recently the pain is better and she wears some. No pain meds, much better, not as weak.    HPI  Review of Systems  Constitutional: Negative for chills, fatigue and fever.  HENT: Negative for hearing loss and tinnitus.   Eyes: Negative for itching.  Respiratory: Negative for chest tightness and shortness of breath.   Cardiovascular: Negative for chest pain, palpitations and leg swelling.  Gastrointestinal: Negative for blood in stool, constipation and diarrhea.  Endocrine: Negative for polyuria.  Genitourinary: Negative for dysuria.  Musculoskeletal: Negative for back pain, joint swelling, neck pain and neck stiffness.  Allergic/Immunologic: Negative for immunocompromised state.  Neurological: Negative for dizziness, numbness and headaches.  Hematological: Does not bruise/bleed easily.  Psychiatric/Behavioral: Negative  for sleep disturbance. The patient is not nervous/anxious.      Objective: Vital Signs: LMP 09/02/2017   Physical Exam  Ortho Exam awake alert and oriented 3. Comfortable sitting. No pain about the right wrist. Negative Tinel's and Phalen's over the median nerve. Good grip and good release. Good opposition of thumb to little finger with good strength. Skin intact. Neurovascular exam intact no swelling in the dorsum or volar aspect of the hand or wrist  Specialty Comments:  No specialty comments available.  Imaging: No results found.   PMFS History:  Patient Active Problem List   Diagnosis Date Noted  . Chronic bilateral pleural effusions 09/23/2017  . Chest pain 09/23/2017  . Stress at work 09/23/2017  . Encounter for examination following treatment at hospital 09/23/2017  . Severe carpal tunnel syndrome of right wrist 08/21/2017  . HNP (herniated nucleus pulposus), lumbar 09/13/2015  . Vitamin D deficiency 07/14/2015  . Mixed hyperlipidemia 11/07/2014  . GERD (gastroesophageal reflux disease) 07/14/2014  . Back pain with left-sided sciatica 03/28/2010  . Obesity, Class II, BMI 35-39.9, with comorbidity 08/22/2009  . Diabetes mellitus type 2 in obese ([place]) 09/17/2006  . Essential hypertension 09/17/2006   Past Medical History:  Diagnosis Date  . Constipation   . GERD (gastroesophageal reflux disease)   . Hypertension   . Morbid obesity ([place])   . NIDDM (non-insulin dependent diabetes mellitus) 2005   Type II    Family History  Problem  Relation Age of Onset  . Diabetes Father   . Hypertension Father   . Kidney disease Father   . Kidney failure Father   . Diabetes Mother   . Hypertension Mother     Past Surgical History:  Procedure Laterality Date  . BACK SURGERY  05/25/2010   Dr,Kabell   . CESAREAN SECTION  6/09, 2015  . LUMBAR LAMINECTOMY/DECOMPRESSION MICRODISCECTOMY Left 09/13/2015   Procedure: Left Lumbar five-Sacral one microdiskectomy;  Surgeon: Ashok Pall, MD;  Location: [facility];  Service: Neurosurgery;  Laterality: Left;  Left Lumbar five-Sacral one microdiskectomy   Social History   Occupational History  . Occupation: unemployed   Tobacco Use  . Smoking status: Never Smoker  . Smokeless tobacco: Never Used  Substance and Sexual Activity  . Alcohol use: No  . Drug use: No  . Sexual activity: Yes    Birth control/protection: None

## 2017-10-03 ENCOUNTER — Ambulatory Visit (INDEPENDENT_AMBULATORY_CARE_PROVIDER_SITE_OTHER): Payer: BLUE CROSS/BLUE SHIELD | Admitting: Cardiovascular Disease

## 2017-10-03 ENCOUNTER — Telehealth: Payer: Self-pay | Admitting: Cardiovascular Disease

## 2017-10-03 ENCOUNTER — Other Ambulatory Visit: Payer: Self-pay

## 2017-10-03 ENCOUNTER — Encounter: Payer: Self-pay | Admitting: *Deleted

## 2017-10-03 ENCOUNTER — Encounter: Payer: Self-pay | Admitting: Cardiovascular Disease

## 2017-10-03 VITALS — BP 115/79 | HR 82 | Ht 62.0 in | Wt 186.0 lb

## 2017-10-03 DIAGNOSIS — E119 Type 2 diabetes mellitus without complications: Secondary | ICD-10-CM

## 2017-10-03 DIAGNOSIS — R079 Chest pain, unspecified: Secondary | ICD-10-CM

## 2017-10-03 DIAGNOSIS — R0602 Shortness of breath: Secondary | ICD-10-CM

## 2017-10-03 DIAGNOSIS — I10 Essential (primary) hypertension: Secondary | ICD-10-CM | POA: Diagnosis not present

## 2017-10-03 DIAGNOSIS — Z9289 Personal history of other medical treatment: Secondary | ICD-10-CM | POA: Diagnosis not present

## 2017-10-03 DIAGNOSIS — E785 Hyperlipidemia, unspecified: Secondary | ICD-10-CM

## 2017-10-03 NOTE — Telephone Encounter (Signed)
Pre-cert Verification for the following procedure   Echo scheduled for 10/23/17  GXT scheduled for 10/08/17 at [facility]

## 2017-10-03 NOTE — Patient Instructions (Signed)
Medication Instructions:  Continue all current medications.  Labwork: none  Testing/Procedures:  Your physician has requested that you have an echocardiogram. Echocardiography is a painless test that uses sound waves to create images of your heart. It provides your doctor with information about the size and shape of your heart and how well your heart's chambers and valves are working. This procedure takes approximately one hour. There are no restrictions for this procedure.  Your physician has requested that you have an exercise tolerance test. For further information please visit [email or web address]. Please also follow instruction sheet, as given.  Office will contact with results via phone or letter.    Follow-Up: 6-8 weeks   Any Other Special Instructions Will Be Listed Below (If Applicable).  If you need a refill on your cardiac medications before your next appointment, please call your pharmacy.

## 2017-10-03 NOTE — Addendum Note (Signed)
Addended by: Laurine Blazer on: 10/03/2017 03:36 PM   Modules accepted: Orders

## 2017-10-03 NOTE — Progress Notes (Signed)
CARDIOLOGY CONSULT NOTE  Patient ID: [name] MRN: [id number] DOB/AGE: 1976-04-25 42 y.o.  Admit date: (Not on file) Primary Physician: Fayrene Helper, MD Referring Physician: Dr. Moshe Cipro  Reason for Consultation: Chest pain  HPI: [name] is a 42 y.o. female who is being seen today for the evaluation of chest pain at the request of Fayrene Helper, MD.   Past medical history includes type 2 diabetes mellitus, hypertension, and hyperlipidemia.  Lipid panel 04/22/17: Total cholesterol 119, HDL 33, triglycerides 82, LDL 70.  Hemoglobin A1c 8.3% on 07/25/17.  BUN 10, creatinine 0.83 on 07/25/17.  Chest x-ray 09/16/17 showed no active cardiopulmonary disease and no significant pleural effusion.  She was apparently evaluated in the ED at [facility] for chest pain earlier this month.  I will have to request records.  She tells me she was given Lasix for 7 days.  Prior to going to the ED, she tried taking Tums but this did not alleviate her symptoms.  She was also short of breath.  Since that time she has not had any more chest pain or shortness of breath.  When it did occur, it was located on the upper right side of her chest and described as a "fist on my chest ".  She also had some mild retrosternal chest discomfort.  She denies palpitations, leg edema, dizziness, lightheadedness, orthopnea, and paroxysmal nocturnal dyspnea.    Allergies  Allergen Reactions  . Ace Inhibitors Cough  . Amlodipine Besylate     REACTION: severe edema of legs    Current Outpatient Medications  Medication Sig Dispense Refill  . Cholecalciferol (VITAMIN D3) 5000 units CAPS Take 1 capsule (5,000 Units total) by mouth daily. 90 capsule 0  . gabapentin (NEURONTIN) 300 MG capsule Take 1 capsule (300 mg total) by mouth at bedtime. 30 capsule 3  . glipiZIDE (GLUCOTROL XL) 10 MG 24 hr tablet Two tablets every morning with breakfast 60 tablet 2  . GLOBAL EASE  INJECT PEN NEEDLES 31G X 8 MM MISC USE WITH LANTUS 100 each 3  . GLOBAL INJECT EASE LANCETS 28G MISC USE TO TEST BLOOD SUGAR TWICE DAILY 100 each 7  . hydrochlorothiazide (HYDRODIURIL) 25 MG tablet TAKE ONE TABLET BY MOUTH EVERY DAY 90 tablet 0  . JANUMET 50-1000 MG tablet TAKE 1 TABLET BY MOUTH 2 TIMES DAILY. 180 tablet 1  . pravastatin (PRAVACHOL) 10 MG tablet TAKE 1 TABLET BY MOUTH AT BEDTIME. 90 tablet 0  . propranolol (INDERAL) 40 MG tablet TAKE 1 TABLET BY MOUTH 2 TIMES DAILY. 180 tablet 0   No current facility-administered medications for this visit.     Past Medical History:  Diagnosis Date  . Constipation   . GERD (gastroesophageal reflux disease)   . Hypertension   . Morbid obesity ([place])   . NIDDM (non-insulin dependent diabetes mellitus) 2005   Type II    Past Surgical History:  Procedure Laterality Date  . BACK SURGERY  05/25/2010   Dr,Kabell   . CESAREAN SECTION  6/09, 2015  . LUMBAR LAMINECTOMY/DECOMPRESSION MICRODISCECTOMY Left 09/13/2015   Procedure: Left Lumbar five-Sacral one microdiskectomy;  Surgeon: Ashok Pall, MD;  Location: [facility];  Service: Neurosurgery;  Laterality: Left;  Left Lumbar five-Sacral one microdiskectomy    Social History   Socioeconomic History  . Marital status: Married    Spouse name: Not on file  . Number of children: Not on file  . Years of education: Not  on file  . Highest education level: Not on file  Social Needs  . Financial resource strain: Not on file  . Food insecurity - worry: Not on file  . Food insecurity - inability: Not on file  . Transportation needs - medical: Not on file  . Transportation needs - non-medical: Not on file  Occupational History  . Occupation: unemployed   Tobacco Use  . Smoking status: Never Smoker  . Smokeless tobacco: Never Used  Substance and Sexual Activity  . Alcohol use: No  . Drug use: No  . Sexual activity: Yes    Birth control/protection: None  Other Topics Concern  . Not on  file  Social History Narrative  . Not on file     No family history of premature CAD in 1st degree relatives.  Current Meds  Medication Sig  . Cholecalciferol (VITAMIN D3) 5000 units CAPS Take 1 capsule (5,000 Units total) by mouth daily.  Marland Kitchen gabapentin (NEURONTIN) 300 MG capsule Take 1 capsule (300 mg total) by mouth at bedtime.  Marland Kitchen glipiZIDE (GLUCOTROL XL) 10 MG 24 hr tablet Two tablets every morning with breakfast  . GLOBAL EASE INJECT PEN NEEDLES 31G X 8 MM MISC USE WITH LANTUS  . GLOBAL INJECT EASE LANCETS 28G MISC USE TO TEST BLOOD SUGAR TWICE DAILY  . hydrochlorothiazide (HYDRODIURIL) 25 MG tablet TAKE ONE TABLET BY MOUTH EVERY DAY  . JANUMET 50-1000 MG tablet TAKE 1 TABLET BY MOUTH 2 TIMES DAILY.  . pravastatin (PRAVACHOL) 10 MG tablet TAKE 1 TABLET BY MOUTH AT BEDTIME.  Marland Kitchen propranolol (INDERAL) 40 MG tablet TAKE 1 TABLET BY MOUTH 2 TIMES DAILY.      Review of systems complete and found to be negative unless listed above in HPI    Physical exam Blood pressure 115/79, pulse 82, height 5\' 2"  (1.575 m), weight 186 lb (84.4 kg), SpO2 100 %. General: NAD Neck: No JVD, no thyromegaly or thyroid nodule.  Lungs: Clear to auscultation bilaterally with normal respiratory effort. CV: Nondisplaced PMI. Regular rate and rhythm, normal S1/S2, no S3/S4, no murmur.  No peripheral edema.  No carotid bruit.    Abdomen: Soft, nontender, no distention.  Skin: Intact without lesions or rashes.  Neurologic: Alert and oriented x 3.  Psych: Normal affect. Extremities: No clubbing or cyanosis.  HEENT: Normal.   ECG: Most recent ECG reviewed.   Labs: Lab Results  Component Value Date/Time   K 3.8 07/25/2017 07:35 AM   BUN 10 07/25/2017 07:35 AM   CREATININE 0.83 07/25/2017 07:35 AM   ALT 18 07/25/2017 07:35 AM   TSH 0.57 04/22/2017 08:08 AM   HGB 11.4 (L) 04/22/2017 08:08 AM     Lipids: Lab Results  Component Value Date/Time   LDLCALC 74 10/11/2016 07:58 AM   CHOL 119 04/22/2017  08:08 AM   TRIG 82 04/22/2017 08:08 AM   HDL 33 (L) 04/22/2017 08:08 AM        ASSESSMENT AND PLAN:  1.  Chest pain and shortness of breath: She has had no symptom recurrence.  Cardiovascular risk factors include hypertension, hyperlipidemia, and type 2 diabetes mellitus.  I will try and obtain records from [facility] including most recent ECG as well as thoracic imaging and blood work.  I will obtain an exercise tolerance test to evaluate for ischemic heart disease.  I will obtain an echocardiogram to evaluate cardiac structure and function.  2.  Hypertension: Controlled on present therapy which includes HIDA chlorothiazide 25 mg daily and  propranolol 40 mg twice daily.  No changes.  3.  Hyperlipidemia: Lipids reviewed above.  Continue pravastatin 10 mg.  4.  Type 2 diabetes mellitus: Currently on glipizide and Janumet.  Labs reviewed above.     Disposition: Follow up in 6 weeks.  Signed: Kate Sable, M.D., F.A.C.C.  10/03/2017, 3:11 PM

## 2017-10-04 ENCOUNTER — Other Ambulatory Visit: Payer: Self-pay | Admitting: Family Medicine

## 2017-10-08 ENCOUNTER — Encounter (HOSPITAL_COMMUNITY): Payer: BLUE CROSS/BLUE SHIELD

## 2017-10-09 ENCOUNTER — Ambulatory Visit (INDEPENDENT_AMBULATORY_CARE_PROVIDER_SITE_OTHER): Payer: BLUE CROSS/BLUE SHIELD | Admitting: Family Medicine

## 2017-10-09 ENCOUNTER — Encounter: Payer: Self-pay | Admitting: Family Medicine

## 2017-10-09 VITALS — BP 120/74 | HR 78 | Resp 16 | Ht 62.0 in | Wt 185.0 lb

## 2017-10-09 DIAGNOSIS — E1169 Type 2 diabetes mellitus with other specified complication: Secondary | ICD-10-CM | POA: Diagnosis not present

## 2017-10-09 DIAGNOSIS — E669 Obesity, unspecified: Secondary | ICD-10-CM

## 2017-10-09 DIAGNOSIS — E782 Mixed hyperlipidemia: Secondary | ICD-10-CM | POA: Diagnosis not present

## 2017-10-09 DIAGNOSIS — E559 Vitamin D deficiency, unspecified: Secondary | ICD-10-CM

## 2017-10-09 DIAGNOSIS — Z888 Allergy status to other drugs, medicaments and biological substances status: Secondary | ICD-10-CM | POA: Diagnosis not present

## 2017-10-09 DIAGNOSIS — I10 Essential (primary) hypertension: Secondary | ICD-10-CM

## 2017-10-09 NOTE — Progress Notes (Signed)
[name]     MRN: [id number]      DOB: 1975/11/25   HPI [name] is here for follow up and re-evaluation of chronic medical conditions, medication management and review of any available recent lab and radiology data.  Preventive health is updated, specifically  Cancer screening and Immunization.   FBG range between 130 to 140, has had sugar fall twice early aftwernoon when she skipped lunch The PT denies any adverse reactions to current medications since the last visit.  Has got her insurance back again and will start seeing endocrine once more for diabetes management, decision taken not to change medication until she sees endo. She has seen cardiology recently and has upcoming future testing She is less stressed than before , and is working for shorter hours    ROS Denies recent fever or chills. Denies sinus pressure, nasal congestion, ear pain or sore throat. Denies chest congestion, productive cough or wheezing. Denies chest pains, palpitations and leg swelling Denies abdominal pain, nausea, vomiting,diarrhea or constipation.   Denies dysuria, frequency, hesitancy or incontinence. Denies joint pain, swelling and limitation in mobility. Denies headaches, seizures, numbness, or tingling. Denies depression, anxiety or insomnia. Denies skin break down or rash.   PE  BP 120/74   Pulse 78   Resp 16   Ht 5\' 2"  (1.575 m)   Wt 185 lb (83.9 kg)   SpO2 98%   BMI 33.84 kg/m   Patient alert and oriented and in no cardiopulmonary distress.  HEENT: No facial asymmetry, EOMI,   oropharynx pink and moist.  Neck supple no JVD, no mass.  Chest: Clear to auscultation bilaterally.  CVS: S1, S2 no murmurs, no S3.Regular rate.  ABD: Soft non tender.   Ext: No edema  MS: Adequate ROM spine, shoulders, hips and knees.  Skin: Intact, no ulcerations or rash noted.  Psych: Good eye contact, normal affect. Memory intact not anxious or depressed appearing.  CNS: CN 2-12  intact, power,  normal throughout.no focal deficits noted.   Assessment & Plan  Essential hypertension Controlled, no change in medication DASH diet and commitment to daily physical activity for a minimum of 30 minutes discussed and encouraged, as a part of hypertension management. The importance of attaining a healthy weight is also discussed.  BP/Weight 10/09/2017 10/03/2017 09/16/2017 08/27/2017 08/21/2017 08/01/2017 5/00/9381  Systolic BP 829 937 169 678 938 101 751  Diastolic BP 74 79 82 76 82 86 86  Wt. (Lbs) 185 186 176 182 182 180 176.6  BMI 33.84 34.02 32.19 33.29 33.29 32.92 32.3       Diabetes mellitus type 2 in obese [name] is reminded of the importance of commitment to daily physical activity for 30 minutes or more, as able and the need to limit carbohydrate intake to 30 to 60 grams per meal to help with blood sugar control. Uncontrolled no medication  Change at this time   The need to take medication as prescribed, test blood sugar as directed, and to call between visits if there is a concern that blood sugar is uncontrolled is also discussed.   [name] is reminded of the importance of daily foot exam, annual eye examination, and good blood sugar, blood pressure and cholesterol control. Updated lab needed and will be faxed to endo  Diabetic Labs Latest Ref Rng & Units 07/25/2017 04/22/2017 01/14/2017 10/16/2016 10/11/2016  HbA1c <5.7 % of total Hgb 8.3(H) 7.8(H) 11.2(H) - 8.1(H)  Microalbumin mg/dL 1.2 - - 35.2(H) -  Micro/Creat Ratio <30 mcg/mg creat 13 - - 23.1 -  Chol <200 mg/dL - 119 - - 122  HDL >50 mg/dL - 33(L) - - 35(L)  Calc LDL <100 mg/dL - - - - 74  Triglycerides <150 mg/dL - 82 - - 65  Creatinine 0.50 - 1.10 mg/dL 0.83 0.80 0.97 - 0.96   BP/Weight 10/09/2017 10/03/2017 09/16/2017 08/27/2017 08/21/2017 08/01/2017 3/41/9622  Systolic BP 297 989 211 941 740 814 481  Diastolic BP 74 79 82 76 82 86 86  Wt. (Lbs) 185 186 176 182 182 180 176.6  BMI 33.84 34.02  32.19 33.29 33.29 32.92 32.3   Foot/eye exam completion dates Latest Ref Rng & Units 10/15/2016 09/19/2016  Eye Exam No Retinopathy - No Retinopathy  Foot exam Order - - -  Foot Form Completion - Done -        Mixed hyperlipidemia Hyperlipidemia:Low fat diet discussed and encouraged.  Controlled, no med change   Lipid Panel  Lab Results  Component Value Date   CHOL 119 04/22/2017   HDL 33 (L) 04/22/2017   LDLCALC 74 10/11/2016   TRIG 82 04/22/2017   CHOLHDL 3.6 04/22/2017       Obesity, Class II, BMI 35-39.9, with comorbidity Unchanged. Patient re-educated about  the importance of commitment to a  minimum of 150 minutes of exercise per week.  The importance of healthy food choices with portion control discussed. Encouraged to start a food diary, count calories and to consider  joining a support group. Sample diet sheets offered. Goals set by the patient for the next several months.   Weight /BMI 10/09/2017 10/03/2017 09/16/2017  WEIGHT 185 lb 186 lb 176 lb  HEIGHT 5\' 2"  5\' 2"  5\' 2"   BMI 33.84 kg/m2 34.02 kg/m2 32.19 kg/m2

## 2017-10-09 NOTE — Patient Instructions (Addendum)
F/u in SEptember, call if you need  Me ebfore  No medication change , and keep appt with Dr Dorris Fetch  HBA1C, fasting lipid, cmp and EGFR and vit D April 17, copy to Dr Dorris Fetch  It is important that you exercise regularly at least 30 minutes 5 times a week. If you develop chest pain, have severe difficulty breathing, or feel very tired, stop exercising immediately and seek medical attention    Please work on good  health habits so that your health will improve. 1. Commitment to daily physical activity for 30 to 60  minutes, if you are able to do this.  2. Commitment to wise food choices. Aim for half of your  food intake to be vegetable and fruit, one quarter starchy foods, and one quarter protein. Try to eat on a regular schedule  3 meals per day, snacking between meals should be limited to vegetables or fruits or small portions of nuts. 64 ounces of water per day is generally recommended, unless you have specific health conditions, like heart failure or kidney failure where you will need to limit fluid intake.  3. Commitment to sufficient and a  good quality of physical and mental rest daily, generally between 6 to 8 hours per day.  WITH PERSISTANCE AND PERSEVERANCE, THE IMPOSSIBLE , BECOMES THE NORM!

## 2017-10-11 ENCOUNTER — Encounter: Payer: Self-pay | Admitting: Family Medicine

## 2017-10-11 NOTE — Assessment & Plan Note (Signed)
Unchanged. Patient re-educated about  the importance of commitment to a  minimum of 150 minutes of exercise per week.  The importance of healthy food choices with portion control discussed. Encouraged to start a food diary, count calories and to consider  joining a support group. Sample diet sheets offered. Goals set by the patient for the next several months.   Weight /BMI 10/09/2017 10/03/2017 09/16/2017  WEIGHT 185 lb 186 lb 176 lb  HEIGHT 5\' 2"  5\' 2"  5\' 2"   BMI 33.84 kg/m2 34.02 kg/m2 32.19 kg/m2

## 2017-10-11 NOTE — Assessment & Plan Note (Signed)
Controlled, no change in medication DASH diet and commitment to daily physical activity for a minimum of 30 minutes discussed and encouraged, as a part of hypertension management. The importance of attaining a healthy weight is also discussed.  BP/Weight 10/09/2017 10/03/2017 09/16/2017 08/27/2017 08/21/2017 08/01/2017 5/61/5379  Systolic BP 432 761 470 929 574 734 037  Diastolic BP 74 79 82 76 82 86 86  Wt. (Lbs) 185 186 176 182 182 180 176.6  BMI 33.84 34.02 32.19 33.29 33.29 32.92 32.3

## 2017-10-11 NOTE — Assessment & Plan Note (Addendum)
[name] is reminded of the importance of commitment to daily physical activity for 30 minutes or more, as able and the need to limit carbohydrate intake to 30 to 60 grams per meal to help with blood sugar control. Uncontrolled no medication  Change at this time   The need to take medication as prescribed, test blood sugar as directed, and to call between visits if there is a concern that blood sugar is uncontrolled is also discussed.   [name] is reminded of the importance of daily foot exam, annual eye examination, and good blood sugar, blood pressure and cholesterol control. Updated lab needed and will be faxed to endo  Diabetic Labs Latest Ref Rng & Units 07/25/2017 04/22/2017 01/14/2017 10/16/2016 10/11/2016  HbA1c <5.7 % of total Hgb 8.3(H) 7.8(H) 11.2(H) - 8.1(H)  Microalbumin mg/dL 1.2 - - 35.2(H) -  Micro/Creat Ratio <30 mcg/mg creat 13 - - 23.1 -  Chol <200 mg/dL - 119 - - 122  HDL >50 mg/dL - 33(L) - - 35(L)  Calc LDL <100 mg/dL - - - - 74  Triglycerides <150 mg/dL - 82 - - 65  Creatinine 0.50 - 1.10 mg/dL 0.83 0.80 0.97 - 0.96   BP/Weight 10/09/2017 10/03/2017 09/16/2017 08/27/2017 08/21/2017 08/01/2017 01/23/4314  Systolic BP 400 867 619 509 326 712 458  Diastolic BP 74 79 82 76 82 86 86  Wt. (Lbs) 185 186 176 182 182 180 176.6  BMI 33.84 34.02 32.19 33.29 33.29 32.92 32.3   Foot/eye exam completion dates Latest Ref Rng & Units 10/15/2016 09/19/2016  Eye Exam No Retinopathy - No Retinopathy  Foot exam Order - - -  Foot Form Completion - Done -

## 2017-10-11 NOTE — Assessment & Plan Note (Signed)
Hyperlipidemia:Low fat diet discussed and encouraged.  Controlled, no med change   Lipid Panel  Lab Results  Component Value Date   CHOL 119 04/22/2017   HDL 33 (L) 04/22/2017   LDLCALC 74 10/11/2016   TRIG 82 04/22/2017   CHOLHDL 3.6 04/22/2017

## 2017-10-13 ENCOUNTER — Ambulatory Visit (HOSPITAL_COMMUNITY)
Admission: RE | Admit: 2017-10-13 | Discharge: 2017-10-13 | Disposition: A | Discharge status: Home / Self Care | Payer: BLUE CROSS/BLUE SHIELD | Source: Ambulatory Visit | Attending: Cardiovascular Disease | Admitting: Cardiovascular Disease

## 2017-10-13 DIAGNOSIS — R079 Chest pain, unspecified: Secondary | ICD-10-CM | POA: Diagnosis not present

## 2017-10-13 DIAGNOSIS — R0602 Shortness of breath: Secondary | ICD-10-CM | POA: Diagnosis present

## 2017-10-13 LAB — EXERCISE TOLERANCE TEST
CHL CUP MPHR: 180 {beats}/min
CHL CUP RESTING HR STRESS: 78 {beats}/min
CHL RATE OF PERCEIVED EXERTION: 15
CSEPEDS: 32 s
CSEPEW: 8.6 METS
Exercise duration (min): 6 min
Peak HR: 166 {beats}/min
Percent HR: 92 %

## 2017-10-23 ENCOUNTER — Other Ambulatory Visit: Payer: BLUE CROSS/BLUE SHIELD

## 2017-10-28 LAB — HM DIABETES EYE EXAM

## 2017-10-29 ENCOUNTER — Other Ambulatory Visit: Payer: Self-pay | Admitting: "Endocrinology

## 2017-11-06 ENCOUNTER — Other Ambulatory Visit: Payer: BLUE CROSS/BLUE SHIELD

## 2017-11-19 ENCOUNTER — Other Ambulatory Visit: Payer: BLUE CROSS/BLUE SHIELD

## 2017-11-21 ENCOUNTER — Ambulatory Visit (INDEPENDENT_AMBULATORY_CARE_PROVIDER_SITE_OTHER): Payer: BLUE CROSS/BLUE SHIELD

## 2017-11-21 ENCOUNTER — Other Ambulatory Visit: Payer: Self-pay

## 2017-11-21 DIAGNOSIS — R079 Chest pain, unspecified: Secondary | ICD-10-CM

## 2017-11-21 DIAGNOSIS — R0602 Shortness of breath: Secondary | ICD-10-CM | POA: Diagnosis not present

## 2017-11-25 ENCOUNTER — Telehealth: Payer: Self-pay | Admitting: *Deleted

## 2017-11-25 LAB — COMPLETE METABOLIC PANEL WITH GFR
AG RATIO: 1.4 (calc) (ref 1.0–2.5)
ALBUMIN MSPROF: 3.9 g/dL (ref 3.6–5.1)
ALT: 13 U/L (ref 6–29)
AST: 12 U/L (ref 10–30)
Alkaline phosphatase (APISO): 66 U/L (ref 33–115)
BUN: 16 mg/dL (ref 7–25)
CO2: 30 mmol/L (ref 20–32)
Calcium: 9.3 mg/dL (ref 8.6–10.2)
Chloride: 101 mmol/L (ref 98–110)
Creat: 0.99 mg/dL (ref 0.50–1.10)
GFR, EST AFRICAN AMERICAN: 83 mL/min/{1.73_m2} (ref >=60)
GFR, Est Non African American: 71 mL/min/{1.73_m2} (ref >=60)
Globulin: 2.7 g/dL (calc) (ref 1.9–3.7)
Glucose, Bld: 192 mg/dL — ABNORMAL HIGH (ref 65–99)
POTASSIUM: 4.6 mmol/L (ref 3.5–5.3)
Sodium: 135 mmol/L (ref 135–146)
TOTAL PROTEIN: 6.6 g/dL (ref 6.1–8.1)
Total Bilirubin: 0.5 mg/dL (ref 0.2–1.2)

## 2017-11-25 LAB — HEMOGLOBIN A1C
EAG (MMOL/L): 11.7 (calc)
Hgb A1c MFr Bld: 9 % of total Hgb — ABNORMAL HIGH (ref <5.7)
Mean Plasma Glucose: 212 (calc)

## 2017-11-25 NOTE — Telephone Encounter (Signed)
Notes recorded by Laurine Blazer, LPN on 11/03/4980 at 64:15 AM EDT Patient notified. Copy to pmd. Follow up scheduled for 12/02/2017 with Dr. Bronson Ing.   ------  Notes recorded by Herminio Commons, MD on 11/24/2017 at 9:17 AM EDT Normal cardiac function.

## 2017-11-26 ENCOUNTER — Other Ambulatory Visit: Payer: Self-pay | Admitting: Family Medicine

## 2017-12-01 ENCOUNTER — Encounter: Payer: Self-pay | Admitting: "Endocrinology

## 2017-12-01 ENCOUNTER — Ambulatory Visit (INDEPENDENT_AMBULATORY_CARE_PROVIDER_SITE_OTHER): Payer: BLUE CROSS/BLUE SHIELD | Admitting: "Endocrinology

## 2017-12-01 VITALS — BP 124/86 | HR 80 | Ht 62.0 in | Wt 185.0 lb

## 2017-12-01 DIAGNOSIS — E669 Obesity, unspecified: Secondary | ICD-10-CM | POA: Diagnosis not present

## 2017-12-01 DIAGNOSIS — I10 Essential (primary) hypertension: Secondary | ICD-10-CM | POA: Diagnosis not present

## 2017-12-01 DIAGNOSIS — E782 Mixed hyperlipidemia: Secondary | ICD-10-CM

## 2017-12-01 DIAGNOSIS — E1169 Type 2 diabetes mellitus with other specified complication: Secondary | ICD-10-CM

## 2017-12-01 MED ORDER — INSULIN GLARGINE 100 UNIT/ML SOLOSTAR PEN: 40.0000 [IU] | PEN_INJECTOR | Freq: Every day | SUBCUTANEOUS | 2 refills | Status: DC

## 2017-12-01 NOTE — Patient Instructions (Signed)

## 2017-12-01 NOTE — Progress Notes (Signed)
Subjective:    Patient ID: [name], female    DOB: 07/11/1976, PCP Fayrene Helper, MD   Past Medical History:  Diagnosis Date  . Constipation   . GERD (gastroesophageal reflux disease)   . Hypertension   . Morbid obesity ([place])   . NIDDM (non-insulin dependent diabetes mellitus) 2005   Type II   Past Surgical History:  Procedure Laterality Date  . BACK SURGERY  05/25/2010   Dr,Kabell   . CESAREAN SECTION  6/09, 2015  . LUMBAR LAMINECTOMY/DECOMPRESSION MICRODISCECTOMY Left 09/13/2015   Procedure: Left Lumbar five-Sacral one microdiskectomy;  Surgeon: Ashok Pall, MD;  Location: [facility];  Service: Neurosurgery;  Laterality: Left;  Left Lumbar five-Sacral one microdiskectomy   Social History   Socioeconomic History  . Marital status: Married    Spouse name: Not on file  . Number of children: Not on file  . Years of education: Not on file  . Highest education level: Not on file  Occupational History  . Occupation: unemployed   Social Needs  . Financial resource strain: Not on file  . Food insecurity:    Worry: Not on file    Inability: Not on file  . Transportation needs:    Medical: Not on file    Non-medical: Not on file  Tobacco Use  . Smoking status: Never Smoker  . Smokeless tobacco: Never Used  Substance and Sexual Activity  . Alcohol use: No  . Drug use: No  . Sexual activity: Yes    Birth control/protection: None  Lifestyle  . Physical activity:    Days per week: Not on file    Minutes per session: Not on file  . Stress: Not on file  Relationships  . Social connections:    Talks on phone: Not on file    Gets together: Not on file    Attends religious service: Not on file    Active member of club or organization: Not on file    Attends meetings of clubs or organizations: Not on file    Relationship status: Not on file  Other Topics Concern  . Not on file  Social History Narrative  . Not on file   Outpatient Encounter  Medications as of 12/01/2017  Medication Sig  . GLOBAL EASE INJECT PEN NEEDLES 31G X 8 MM MISC USE WITH LANTUS  . GLOBAL INJECT EASE LANCETS 28G MISC USE TO TEST BLOOD SUGAR TWICE DAILY  . hydrochlorothiazide (HYDRODIURIL) 25 MG tablet TAKE ONE TABLET BY MOUTH EVERY DAY  . Insulin Glargine (LANTUS SOLOSTAR) 100 UNIT/ML Solostar Pen Inject 40 Units into the skin daily at 10 pm.  . JANUMET 50-1000 MG tablet TAKE 1 TABLET BY MOUTH 2 TIMES DAILY.  Marland Kitchen omeprazole (PRILOSEC) 20 MG capsule TAKE ONE CAPSULE BY MOUTH EVERY DAY  . pravastatin (PRAVACHOL) 10 MG tablet TAKE 1 TABLET BY MOUTH AT BEDTIME.  Marland Kitchen propranolol (INDERAL) 40 MG tablet TAKE 1 TABLET BY MOUTH 2 TIMES DAILY.  . [DISCONTINUED] glipiZIDE (GLUCOTROL XL) 10 MG 24 hr tablet TAKE TWO TABLETS BY MOUTH EVERY MORNING with BREAKFAST (discontinue lantus)   No facility-administered encounter medications on file as of 12/01/2017.    ALLERGIES: Allergies  Allergen Reactions  . Ace Inhibitors Cough  . Amlodipine Besylate     REACTION: severe edema of legs   VACCINATION STATUS: Immunization History  Administered Date(s) Administered  . Influenza Split 05/20/2011, 05/17/2014  . Influenza Whole 06/09/2006, 05/02/2008, 03/06/2010  . Influenza,inj,Quad PF,6+ Mos 05/17/2015,  04/08/2016, 04/28/2017  . Pneumococcal Polysaccharide-23 08/14/2009, 10/15/2016  . Td 12/05/2009    Diabetes  She presents for her follow-up diabetic visit. She has type 2 diabetes mellitus. Onset time: She was diagnosed at age of 37 years. Her disease course has been worsening. There are no hypoglycemic associated symptoms. Pertinent negatives for hypoglycemia include no confusion, headaches, pallor or seizures. Associated symptoms include polydipsia and polyuria. Pertinent negatives for diabetes include no blurred vision, no chest pain, no fatigue and no polyphagia. There are no hypoglycemic complications. Symptoms are worsening. There are no diabetic complications. Risk factors  for coronary artery disease include diabetes mellitus, dyslipidemia, hypertension, obesity and sedentary lifestyle. Current diabetic treatment includes oral agent (dual therapy) (She is currently on Janumet and Glucotrol.  She was supposed to be on Lantus as well, however d/ced by her own.). She is compliant with treatment some of the time. Her weight is increasing steadily. She is following a generally unhealthy diet. She has had a previous visit with a dietitian. She never participates in exercise. Her breakfast blood glucose range is generally >200 mg/dl. Her overall blood glucose range is >200 mg/dl. An ACE inhibitor/angiotensin II receptor blocker is contraindicated.  Hyperlipidemia  This is a chronic problem. The current episode started more than 1 year ago. The problem is controlled. Exacerbating diseases include diabetes and obesity. Pertinent negatives include no chest pain, myalgias or shortness of breath. Current antihyperlipidemic treatment includes statins. Risk factors for coronary artery disease include dyslipidemia, diabetes mellitus, hypertension, obesity and a sedentary lifestyle.  Hypertension  This is a chronic problem. The current episode started more than 1 year ago. The problem is controlled. Pertinent negatives include no blurred vision, chest pain, headaches, palpitations or shortness of breath. Risk factors for coronary artery disease include obesity, family history, dyslipidemia, diabetes mellitus and sedentary lifestyle. Past treatments include diuretics. The current treatment provides moderate improvement.    Review of Systems  Constitutional: Negative for fatigue and unexpected weight change.  HENT: Negative for trouble swallowing and voice change.   Eyes: Negative for blurred vision and visual disturbance.  Respiratory: Negative for cough, shortness of breath and wheezing.   Cardiovascular: Negative for chest pain, palpitations and leg swelling.  Gastrointestinal: Negative  for diarrhea, nausea and vomiting.  Endocrine: Positive for polydipsia and polyuria. Negative for cold intolerance, heat intolerance and polyphagia.  Musculoskeletal: Negative for arthralgias and myalgias.  Skin: Negative for color change, pallor, rash and wound.  Neurological: Negative for seizures and headaches.  Psychiatric/Behavioral: Negative for confusion and suicidal ideas.    Objective:    BP 124/86   Pulse 80   Ht 5\' 2"  (1.575 m)   Wt 185 lb (83.9 kg)   BMI 33.84 kg/m   Wt Readings from Last 3 Encounters:  12/01/17 185 lb (83.9 kg)  10/09/17 185 lb (83.9 kg)  10/03/17 186 lb (84.4 kg)    Physical Exam  Constitutional: She is oriented to person, place, and time. She appears well-developed.  HENT:  Head: Normocephalic and atraumatic.  Eyes: EOM are normal.  Neck: Normal range of motion. Neck supple. No tracheal deviation present. No thyromegaly present.  Cardiovascular: Normal rate.  Pulmonary/Chest: Effort normal.  Abdominal: There is no tenderness. There is no guarding.  Musculoskeletal: Normal range of motion. She exhibits no edema.  Neurological: She is alert and oriented to person, place, and time. She has normal reflexes. No cranial nerve deficit. Coordination normal.  Skin: Skin is warm and dry. No rash noted. No erythema.  No pallor.  Psychiatric: She has a normal mood and affect. Judgment normal.    Results for orders placed or performed in visit on 11/24/17  HM DIABETES EYE EXAM  Result Value Ref Range   HM Diabetic Eye Exam No Retinopathy No Retinopathy   Diabetic Labs (most recent): Lab Results  Component Value Date   HGBA1C 9.0 (H) 11/24/2017   HGBA1C 8.3 (H) 07/25/2017   HGBA1C 7.8 (H) 04/22/2017   Lipid Panel     Component Value Date/Time   CHOL 119 04/22/2017 0808   TRIG 82 04/22/2017 0808   HDL 33 (L) 04/22/2017 0808   CHOLHDL 3.6 04/22/2017 0808   VLDL 13 10/11/2016 0758   LDLCALC 70 04/22/2017 0808     Assessment & Plan:   1.  Diabetes mellitus type 2 in obese [facility])  - Due to noncompliance,  her Lantus was discontinued.  Her previsit A1c is higher at 9% increasing from 8.3%.   -She did have even higher A1c of 11.2% last year.     - Patient remains at a high risk for more acute and chronic complications of diabetes which include CAD, CVA, CKD, retinopathy, and neuropathy. These are all discussed in detail with the patient.  Recent labs reviewed. - I have re-counseled the patient on diet management and weight loss  by adopting a carbohydrate restricted / protein rich  Diet.  -  Suggestion is made for her to avoid simple carbohydrates  from her diet including Cakes, Sweet Desserts / Pastries, Ice Cream, Soda (diet and regular), Sweet Tea, Candies, Chips, Cookies, Store Bought Juices, Alcohol in Excess of  1-2 drinks a day, Artificial Sweeteners, and "Sugar-free" Products. This will help patient to have stable blood glucose profile and potentially avoid unintended weight gain.  - Patient is advised to stick to a routine mealtimes to eat 3 meals  a day and avoid unnecessary snacks ( to snack only to correct hypoglycemia).  - I have approached patient with the following individualized plan to manage diabetes and patient agrees.  -She has responded very well to basal insulin in the past, based on her presentation with above target blood glucose readings and A1c of 9%, she is reapproached for reinitiation of basal insulin and she agrees.   -I discussed and prescribed Lantus again at 40 units nightly,  associated with strict monitoring of blood glucose 2 times a day-before  breakfast and at bedtime. -  I will continue Janumet 50/1000mg  po twice a day , therapeutically suitable for patient. -I advised her to discontinue Glucotrol.  -Patient is encouraged to call clinic for blood glucose levels less than 70 or above 300 mg /dl. - Patient specific target  for A1c; LDL, HDL, Triglycerides, and  Waist Circumference were  discussed in detail.  2) BP/HTN: Her blood pressure is controlled to target.   I advised her to continue her current blood pressure medications including hydrochlorothiazide.  She is documented to be allergic to ACE inhibitor's.  3) Lipids/HPL:   controlled, LDL 70.  continue statins.  4)  Weight/Diet: CDE consult in progress, exercise, and carbohydrates information provided.  5) Vitamin D deficiency -   her vitamin D still low although improving. She is status post therapy with vitamin D 50,000 units weekly for 12 weeks. I will prescribe vitamin D3 5000 units by mouth daily for the next 90 days.  6) Chronic Care/Health Maintenance:  -Patient is on a Statin medications (allergic to ACE inhibitors) and encouraged to continue to follow  up with Ophthalmology, Podiatrist at least yearly or according to recommendations, and advised to  stay away from smoking. I have recommended yearly flu vaccine and pneumonia vaccination at least every 5 years; moderate intensity exercise for up to 150 minutes weekly; and  sleep for at least 7 hours a day.  - I advised patient to maintain close follow up with Fayrene Helper, MD for primary care needs.    - Time spent with the patient: 25 min, of which >50% was spent in reviewing her blood glucose logs , discussing her hypo- and hyper-glycemic episodes, reviewing her current and  previous labs and insulin doses and developing a plan to avoid hypo- and hyper-glycemia. Please refer to Patient Instructions for Blood Glucose Monitoring and Insulin/Medications Dosing Guide"  in media tab for additional information. [name] participated in the discussions, expressed understanding, and voiced agreement with the above plans.  All questions were answered to her satisfaction. she is encouraged to contact clinic should she have any questions or concerns prior to her return visit.  Follow up plan: -Return in about 3 months (around 03/02/2018) for meter, and  logs.  Glade Lloyd, MD Phone: [phone]  Fax: [phone]  This note was partially dictated with voice recognition software. Similar sounding words can be transcribed inadequately or may not  be corrected upon review.  12/01/2017, 1:07 PM

## 2017-12-02 ENCOUNTER — Ambulatory Visit: Payer: BLUE CROSS/BLUE SHIELD | Admitting: Cardiovascular Disease

## 2017-12-03 ENCOUNTER — Ambulatory Visit (INDEPENDENT_AMBULATORY_CARE_PROVIDER_SITE_OTHER): Payer: BLUE CROSS/BLUE SHIELD | Admitting: Cardiovascular Disease

## 2017-12-03 ENCOUNTER — Other Ambulatory Visit: Payer: Self-pay

## 2017-12-03 ENCOUNTER — Encounter: Payer: Self-pay | Admitting: Cardiovascular Disease

## 2017-12-03 VITALS — BP 112/73 | HR 79 | Ht 62.0 in | Wt 185.0 lb

## 2017-12-03 DIAGNOSIS — E785 Hyperlipidemia, unspecified: Secondary | ICD-10-CM

## 2017-12-03 DIAGNOSIS — E119 Type 2 diabetes mellitus without complications: Secondary | ICD-10-CM

## 2017-12-03 DIAGNOSIS — I10 Essential (primary) hypertension: Secondary | ICD-10-CM

## 2017-12-03 DIAGNOSIS — Z566 Other physical and mental strain related to work: Secondary | ICD-10-CM

## 2017-12-03 NOTE — Patient Instructions (Signed)
Your physician recommends that you schedule a follow-up appointment in: AS NEEDED WITH DR KONESWARAN  Your physician recommends that you continue on your current medications as directed. Please refer to the Current Medication list given to you today.  Thank you for choosing [facility]!!    

## 2017-12-03 NOTE — Progress Notes (Signed)
SUBJECTIVE: The patient returns for follow-up after undergoing cardiovascular testing performed for the evaluation of chest pain.  She underwent a low risk exercise tolerance test with no evidence for ischemia on 10/13/2017.  Echocardiogram on 11/21/2017 demonstrated normal left ventricular systolic and diastolic function and normal regional wall motion, LVEF 60 to 65%.  The patient denies any symptoms of chest pain, palpitations, shortness of breath, lightheadedness, dizziness, leg swelling, orthopnea, PND, and syncope.  She believes her previous issues were related to stress at her job.  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  She has since quit that job and is now only taking care of 1 person.  She is feeling much better.     Review of Systems: As per "subjective", otherwise negative.  Allergies  Allergen Reactions  . Ace Inhibitors Cough  . Amlodipine Besylate     REACTION: severe edema of legs    Current Outpatient Medications  Medication Sig Dispense Refill  . GLOBAL EASE INJECT PEN NEEDLES 31G X 8 MM MISC USE WITH LANTUS 100 each 3  . GLOBAL INJECT EASE LANCETS 28G MISC USE TO TEST BLOOD SUGAR TWICE DAILY 100 each 7  . hydrochlorothiazide (HYDRODIURIL) 25 MG tablet TAKE ONE TABLET BY MOUTH EVERY DAY 90 tablet 0  . Insulin Glargine (LANTUS SOLOSTAR) 100 UNIT/ML Solostar Pen Inject 40 Units into the skin daily at 10 pm. 5 pen 2  . JANUMET 50-1000 MG tablet TAKE 1 TABLET BY MOUTH 2 TIMES DAILY. 180 tablet 1  . omeprazole (PRILOSEC) 20 MG capsule TAKE ONE CAPSULE BY MOUTH EVERY DAY 90 capsule 1  . pravastatin (PRAVACHOL) 10 MG tablet TAKE 1 TABLET BY MOUTH AT BEDTIME. 90 tablet 0  . propranolol (INDERAL) 40 MG tablet TAKE 1 TABLET BY MOUTH 2 TIMES DAILY. 180 tablet 0   No current facility-administered medications for this visit.     Past Medical History:  Diagnosis Date  . Constipation   . GERD (gastroesophageal  reflux disease)   . Hypertension   . Morbid obesity ([place])   . NIDDM (non-insulin dependent diabetes mellitus) 2005   Type II    Past Surgical History:  Procedure Laterality Date  . BACK SURGERY  05/25/2010   Dr,Kabell   . CESAREAN SECTION  6/09, 2015  . LUMBAR LAMINECTOMY/DECOMPRESSION MICRODISCECTOMY Left 09/13/2015   Procedure: Left Lumbar five-Sacral one microdiskectomy;  Surgeon: Ashok Pall, MD;  Location: [facility];  Service: Neurosurgery;  Laterality: Left;  Left Lumbar five-Sacral one microdiskectomy    Social History   Socioeconomic History  . Marital status: Married    Spouse name: Not on file  . Number of children: Not on file  . Years of education: Not on file  . Highest education level: Not on file  Occupational History  . Occupation: unemployed   Social Needs  . Financial resource strain: Not on file  . Food insecurity:    Worry: Not on file    Inability: Not on file  . Transportation needs:    Medical: Not on file    Non-medical: Not on file  Tobacco Use  . Smoking status: Never Smoker  . Smokeless tobacco: Never Used  Substance and Sexual Activity  . Alcohol use: No  . Drug use: No  . Sexual activity: Yes    Birth control/protection: None  Lifestyle  . Physical activity:    Days per week: Not on file    Minutes per  session: Not on file  . Stress: Not on file  Relationships  . Social connections:    Talks on phone: Not on file    Gets together: Not on file    Attends religious service: Not on file    Active member of club or organization: Not on file    Attends meetings of clubs or organizations: Not on file    Relationship status: Not on file  . Intimate partner violence:    Fear of current or ex partner: Not on file    Emotionally abused: Not on file    Physically abused: Not on file    Forced sexual activity: Not on file  Other Topics Concern  . Not on file  Social History Narrative  . Not on file     Vitals:   12/03/17 0842    BP: 112/73  Pulse: 79  SpO2: 98%  Weight: 185 lb (83.9 kg)  Height: 5\' 2"  (1.575 m)    Wt Readings from Last 3 Encounters:  12/03/17 185 lb (83.9 kg)  12/01/17 185 lb (83.9 kg)  10/09/17 185 lb (83.9 kg)     PHYSICAL EXAM General: NAD HEENT: Normal. Neck: No JVD, no thyromegaly. Lungs: Clear to auscultation bilaterally with normal respiratory effort. CV: Regular rate and rhythm, normal S1/S2, no S3/S4, no murmur. No pretibial or periankle edema.  No carotid bruit.   Abdomen: Soft, nontender, no distention.  Neurologic: Alert and oriented.  Psych: Normal affect. Skin: Normal. Musculoskeletal: No gross deformities.    ECG: Most recent ECG reviewed.   Labs: Lab Results  Component Value Date/Time   K 4.6 11/24/2017 07:34 AM   BUN 16 11/24/2017 07:34 AM   CREATININE 0.99 11/24/2017 07:34 AM   ALT 13 11/24/2017 07:34 AM   TSH 0.57 04/22/2017 08:08 AM   HGB 11.4 (L) 04/22/2017 08:08 AM     Lipids: Lab Results  Component Value Date/Time   LDLCALC 70 04/22/2017 08:08 AM   CHOL 119 04/22/2017 08:08 AM   TRIG 82 04/22/2017 08:08 AM   HDL 33 (L) 04/22/2017 08:08 AM       ASSESSMENT AND PLAN: 1.  Chest pain and shortness of breath: She has had no symptom recurrence.    Prior symptoms may have been related to work-related stress.  Exercise tolerance test and echocardiogram were normal as detailed above.  No further testing is indicated at this time.  2.  Hypertension: Controlled on present therapy which includes hydrochlorothiazide 25 mg daily and propranolol 40 mg twice daily.  No changes.  3.  Hyperlipidemia: Lipids reviewed at last office visit with me. Continue pravastatin 10 mg.  4.  Type 2 diabetes mellitus: Currently on glipizide and Janumet.  Labs reviewed above.      Disposition: Follow up as needed   Kate Sable, M.D., F.A.C.C.

## 2017-12-09 ENCOUNTER — Other Ambulatory Visit: Payer: Self-pay | Admitting: Family Medicine

## 2017-12-15 ENCOUNTER — Ambulatory Visit: Payer: BLUE CROSS/BLUE SHIELD | Admitting: Family Medicine

## 2017-12-22 ENCOUNTER — Telehealth: Payer: Self-pay

## 2017-12-22 NOTE — Telephone Encounter (Signed)
Pt states she has had high BG readings.   Date Before breakfast Before lunch Before supper Bedtime  5/10 302   273  5/11 351   258  5/12 381   166  5/13 341       Pt taking: Lantus 40 units qhs, Janumet 50/1000mg  bid

## 2017-12-22 NOTE — Telephone Encounter (Signed)
Increase lantus to 60 units qhs. Call back if >200x3.

## 2017-12-22 NOTE — Telephone Encounter (Signed)
Pt.notified

## 2017-12-23 ENCOUNTER — Other Ambulatory Visit: Payer: Self-pay | Admitting: Family Medicine

## 2018-01-07 ENCOUNTER — Other Ambulatory Visit: Payer: Self-pay

## 2018-01-07 MED ORDER — INSULIN GLARGINE 100 UNIT/ML SOLOSTAR PEN: 60.0000 [IU] | PEN_INJECTOR | Freq: Every day | SUBCUTANEOUS | 2 refills | Status: DC

## 2018-01-21 ENCOUNTER — Encounter: Payer: Self-pay | Admitting: Family Medicine

## 2018-01-22 ENCOUNTER — Other Ambulatory Visit: Payer: Self-pay | Admitting: Family Medicine

## 2018-01-22 DIAGNOSIS — E1065 Type 1 diabetes mellitus with hyperglycemia: Secondary | ICD-10-CM

## 2018-01-22 NOTE — Progress Notes (Signed)
amb endo  

## 2018-02-20 ENCOUNTER — Encounter: Payer: Self-pay | Admitting: Internal Medicine

## 2018-02-20 ENCOUNTER — Ambulatory Visit (INDEPENDENT_AMBULATORY_CARE_PROVIDER_SITE_OTHER): Payer: BLUE CROSS/BLUE SHIELD | Admitting: Internal Medicine

## 2018-02-20 VITALS — BP 114/82 | HR 87 | Ht 62.0 in | Wt 187.2 lb

## 2018-02-20 DIAGNOSIS — E669 Obesity, unspecified: Secondary | ICD-10-CM

## 2018-02-20 DIAGNOSIS — E1169 Type 2 diabetes mellitus with other specified complication: Secondary | ICD-10-CM | POA: Diagnosis not present

## 2018-02-20 LAB — POCT GLYCOSYLATED HEMOGLOBIN (HGB A1C): Hemoglobin A1C: 8.8 % — AB (ref 4.0–5.6)

## 2018-02-20 MED ORDER — SEMAGLUTIDE(0.25 OR 0.5MG/DOS) 2 MG/1.5ML ~~LOC~~ SOPN: 0.5000 mg | PEN_INJECTOR | SUBCUTANEOUS | 5 refills | Status: DC

## 2018-02-20 NOTE — Progress Notes (Signed)
Patient ID: [name], female   DOB: [birth year], 42 y.o.   MRN: [id number]   HPI: [name] is a 42 y.o.-year-old female, referred by her PCP, Dr.  Moshe Cipro, for management of DM2, dx in 2005, insulin-dependent since 2018, uncontrolled, without long-term complications.  She previously saw Dr. Dorris Fetch.  Last hemoglobin A1c was: Lab Results  Component Value Date   HGBA1C 9.0 (H) 11/24/2017   HGBA1C 8.3 (H) 07/25/2017   HGBA1C 7.8 (H) 04/22/2017   Pt is on a regimen of: - JanuMet 50-1000 mg 2x a day, with meals - Lantus 40 >> 60 units at bedtime - re-added 11/2017  She stopped Glipizide in 11/2017. She stopped [place] b/c increased urination. She stopped Invokana b/c yeast inf's.  Pt checks her sugars 2x a day and they are: - am: 190-230 - 2h after b'fast: n/c - before lunch: n/c - 2h after lunch: n/c - before dinner: n/c - 2h after dinner: n/c - bedtime: 170-190 - nighttime: n/c No lows. Lowest sugar was 69 - pm); she has hypoglycemia awareness at 70.  Highest sugar was 300s - 2x.  Glucometer: Truemetrix - we could not download her meter   Pt's meals are: - Breakfast: cereal + milk, muffin + fruit - Lunch: salad, sandwich, hamburger - Dinner: meat + veggies  - Snacks: fruit, crackers  - no CKD, last BUN/creatinine:  Lab Results  Component Value Date   BUN 16 11/24/2017   BUN 10 07/25/2017   CREATININE 0.99 11/24/2017   CREATININE 0.83 07/25/2017  She has an allergy to ACE inhibitors.  -+ Dyslipidemia; last set of lipids: Lab Results  Component Value Date   CHOL 119 04/22/2017   HDL 33 (L) 04/22/2017   LDLCALC 70 04/22/2017   TRIG 82 04/22/2017   CHOLHDL 3.6 04/22/2017  On pravastatin 10.  - last eye exam was in 10/2017. No DR.   - no numbness and tingling in her feet.  Pt has FH of DM in M.  ROS: Constitutional: + Decreased appetite, + weight gain no fatigue, no subjective hyperthermia/hypothermia Eyes: no blurry vision, no  xerophthalmia ENT: no sore throat, no nodules palpated in throat, no dysphagia/odynophagia, no hoarseness Cardiovascular: no CP/SOB/palpitations/leg swelling Respiratory: no cough/SOB Gastrointestinal: no N/V/D/C Musculoskeletal: no muscle/joint aches Skin: no rashes Neurological: no tremors/numbness/tingling/dizziness Psychiatric: no depression/anxiety  Past Medical History:  Diagnosis Date  . Constipation   . GERD (gastroesophageal reflux disease)   . Hypertension   . Morbid obesity ([place])   . NIDDM (non-insulin dependent diabetes mellitus) 2005   Type II   Past Surgical History:  Procedure Laterality Date  . BACK SURGERY  05/25/2010   Dr,Kabell   . CESAREAN SECTION  6/09, 2015  . LUMBAR LAMINECTOMY/DECOMPRESSION MICRODISCECTOMY Left 09/13/2015   Procedure: Left Lumbar five-Sacral one microdiskectomy;  Surgeon: Ashok Pall, MD;  Location: [place] [facility];  Service: Neurosurgery;  Laterality: Left;  Left Lumbar five-Sacral one microdiskectomy   Social History   Socioeconomic History  . Marital status: Married    Spouse name: Not on file  . Number of children: 2  . Years of education: Not on file  . Highest education level: Not on file  Occupational History  . Occupation: unemployed   Tobacco Use  . Smoking status: Never Smoker  . Smokeless tobacco: Never Used  Substance and Sexual Activity  . Alcohol use: No  . Drug use: No   Current Outpatient Medications on File Prior to Visit  Medication Sig Dispense Refill  .  GLOBAL EASE INJECT PEN NEEDLES 31G X 8 MM MISC USE WITH LANTUS 100 each 3  . GLOBAL INJECT EASE LANCETS 28G MISC USE TO TEST BLOOD SUGAR TWICE DAILY 100 each 7  . hydrochlorothiazide (HYDRODIURIL) 25 MG tablet TAKE ONE TABLET BY MOUTH EVERY DAY 90 tablet 0  . Insulin Glargine (LANTUS SOLOSTAR) 100 UNIT/ML Solostar Pen Inject 60 Units into the skin daily at 10 pm. 15 mL 2  . JANUMET 50-1000 MG tablet TAKE ONE TABLET BY MOUTH TWICE DAILY 180 tablet 1  .  omeprazole (PRILOSEC) 20 MG capsule TAKE ONE CAPSULE BY MOUTH EVERY DAY 90 capsule 1  . pravastatin (PRAVACHOL) 10 MG tablet TAKE 1 TABLET BY MOUTH AT BEDTIME. 90 tablet 0  . propranolol (INDERAL) 40 MG tablet TAKE 1 TABLET BY MOUTH 2 TIMES DAILY. 180 tablet 0   No current facility-administered medications on file prior to visit.    Allergies  Allergen Reactions  . Ace Inhibitors Cough  . Amlodipine Besylate     REACTION: severe edema of legs   Family History  Problem Relation Age of Onset  . Diabetes Father   . Hypertension Father   . Kidney disease Father   . Kidney failure Father   . Diabetes Mother   . Hypertension Mother     PE: BP 114/82   Pulse 87   Ht 5\' 2"  (1.575 m)   Wt 187 lb 3.2 oz (84.9 kg)   LMP 01/26/2018   SpO2 97%   BMI 34.24 kg/m  Wt Readings from Last 3 Encounters:  02/20/18 187 lb 3.2 oz (84.9 kg)  12/03/17 185 lb (83.9 kg)  12/01/17 185 lb (83.9 kg)   Constitutional: overweight, in NAD Eyes: PERRLA, EOMI, no exophthalmos ENT: moist mucous membranes, no thyromegaly, no cervical lymphadenopathy Cardiovascular: RRR, No MRG Respiratory: CTA B Gastrointestinal: abdomen soft, NT, ND, BS+ Musculoskeletal: no deformities, strength intact in all 4 Skin: moist, warm, no rashes, + acanthosis nigricans on neck Neurological: no tremor with outstretched hands, DTR normal in all 4  ASSESSMENT: 1. DM2, insulin-dependent, uncontrolled, without long-term complications, but with hyperglycemia  PLAN:  1. Patient with long-standing, uncontrolled diabetes, on oral antidiabetic regimen and a higher dose of long-acting insulin, which became insufficient. HbA1c today: 8.8%. - At this visit, discussed about the importance of decreasing her insulin resistance, especially since she mentions that her sugars are higher in the morning and they decreased later in the day.  I explained that this can be caused by several processes including dawn phenomenon, hepatic IR with  increased gluconeogenesis overnight, a late dinner that is high in protein and fat, decreased activity in the prior day, etc. - I explained the concept of insulin resistance and we discussed about how to decrease this.  I suggested a diet low in saturated fat and given her references. She saw the nutritionist at Dr. Liliane Channel office.  I advised her that I can also refer her to our nutritionist if she would like to see a nutritionist again.  She will let me know in the future. - We also discussed about the fact that Januvia is likely not helping her sugars too much and I would suggest to switch to a GLP-1 receptor agonist.  She agrees with this.  I advised her to start Ozempic at low dose and increase as tolerated.  As she tells me that she has a lot of Janumet at home, will continue Janumet until she runs out and then continue only with metformin (will  need a prescription).  I advised her to keep a close eye on her sugars since they can start improving significantly after starting Ozempic, in which case, she will need to back off Lantus. - I suggested to:  Patient Instructions  Please continue: - Lantus 60 units at bedtime - Janumet 1000 mg 2x a day  Please start Ozempic 0.25 mg weekly in a.m. (for example on Sunday morning) x 4 weeks, then increase to 0.5 mg weekly in a.m. if no nausea or hypoglycemia.  Please let me know if the sugars are consistently <80 or >200.  Please return in 3 months with your sugar log.   - Continue checking sugars at different times of the day - check 2 times a day, rotating checks - given sugar log and advised how to fill it and to bring it at next appt  - given foot care handout and explained the principles  - given instructions for hypoglycemia management "15-15 rule"  - advised for yearly eye exams-she is up-to-date - Return to clinic in 3 mo with sugar log   Philemon Kingdom, MD PhD [facility]

## 2018-02-20 NOTE — Patient Instructions (Signed)
Please continue: - Lantus 60 units at bedtime - Janumet 1000 mg 2x a day  Please start Ozempic 0.25 mg weekly in a.m. (for example on Sunday morning) x 4 weeks, then increase to 0.5 mg weekly in a.m. if no nausea or hypoglycemia.  Please let me know if the sugars are consistently <80 or >200.  Please return in 3 months with your sugar log.   PATIENT INSTRUCTIONS FOR TYPE 2 DIABETES:  DIET AND EXERCISE Diet and exercise is an important part of diabetic treatment.  We recommended aerobic exercise in the form of brisk walking (working between 40-60% of maximal aerobic capacity, similar to brisk walking) for 150 minutes per week (such as 30 minutes five days per week) along with 3 times per week performing 'resistance' training (using various gauge rubber tubes with handles) 5-10 exercises involving the major muscle groups (upper body, lower body and core) performing 10-15 repetitions (or near fatigue) each exercise. Start at half the above goal but build slowly to reach the above goals. If limited by weight, joint pain, or disability, we recommend daily walking in a swimming pool with water up to waist to reduce pressure from joints while allow for adequate exercise.    BLOOD GLUCOSES Monitoring your blood glucoses is important for continued management of your diabetes. Please check your blood glucoses 2-4 times a day: fasting, before meals and at bedtime (you can rotate these measurements - e.g. one day check before the 3 meals, the next day check before 2 of the meals and before bedtime, etc.).   HYPOGLYCEMIA (low blood sugar) Hypoglycemia is usually a reaction to not eating, exercising, or taking too much insulin/ other diabetes drugs.  Symptoms include tremors, sweating, hunger, confusion, headache, etc. Treat IMMEDIATELY with 15 grams of Carbs: . 4 glucose tablets .  cup regular juice/soda . 2 tablespoons raisins . 4 teaspoons sugar . 1 tablespoon honey Recheck blood glucose in 15 mins  and repeat above if still symptomatic/blood glucose <100.  RECOMMENDATIONS TO REDUCE YOUR RISK OF DIABETIC COMPLICATIONS: * Take your prescribed MEDICATION(S) * Follow a DIABETIC diet: Complex carbs, fiber rich foods, (monounsaturated and polyunsaturated) fats * AVOID saturated/trans fats, high fat foods, >2,300 mg salt per day. * EXERCISE at least 5 times a week for 30 minutes or preferably daily.  * DO NOT SMOKE OR DRINK more than 1 drink a day. * Check your FEET every day. Do not wear tightfitting shoes. Contact us if you develop an ulcer * See your EYE doctor once a year or more if needed * Get a FLU shot once a year * Get a PNEUMONIA vaccine once before and once after age 71 years  GOALS:  * Your Hemoglobin A1c of <7%  * fasting sugars need to be <130 * after meals sugars need to be <180 (2h after you start eating) * Your Systolic BP should be 269 or lower  * Your Diastolic BP should be 80 or lower  * Your HDL (Good Cholesterol) should be 40 or higher  * Your LDL (Bad Cholesterol) should be 100 or lower. * Your Triglycerides should be 150 or lower  * Your Urine microalbumin (kidney function) should be <30 * Your Body Mass Index should be 25 or lower    Please consider the following ways to cut down carbs and fat and increase fiber and micronutrients in your diet: - substitute whole grain for white bread or pasta - substitute brown rice for white rice - substitute 90-calorie flat  bread pieces for slices of bread when possible - substitute sweet potatoes or yams for white potatoes - substitute humus for margarine - substitute tofu for cheese when possible - substitute almond or rice milk for regular milk (would not drink soy milk daily due to concern for soy estrogen influence on breast cancer risk) - substitute dark chocolate for other sweets when possible - substitute water - can add lemon or orange slices for taste - for diet sodas (artificial sweeteners will trick your body  that you can eat sweets without getting calories and will lead you to overeating and weight gain in the long run) - do not skip breakfast or other meals (this will slow down the metabolism and will result in more weight gain over time)  - can try smoothies made from fruit and almond/rice milk in am instead of regular breakfast - can also try old-fashioned (not instant) oatmeal made with almond/rice milk in am - order the dressing on the side when eating salad at a restaurant (pour less than half of the dressing on the salad) - eat as little meat as possible - can try juicing, but should not forget that juicing will get rid of the fiber, so would alternate with eating raw veg./fruits or drinking smoothies - use as little oil as possible, even when using olive oil - can dress a salad with a mix of balsamic vinegar and lemon juice, for e.g. - use agave nectar, stevia sugar, or regular sugar rather than artificial sweateners - steam or broil/roast veggies  - snack on veggies/fruit/nuts (unsalted, preferably) when possible, rather than processed foods - reduce or eliminate aspartame in diet (it is in diet sodas, chewing gum, etc) Read the labels!  Try to read Dr. Janene Harvey book: "Program for Reversing Diabetes" for other ideas for healthy eating.

## 2018-02-20 NOTE — Addendum Note (Signed)
Addended by: Drucilla Schmidt on: 02/20/2018 03:57 PM   Modules accepted: Orders

## 2018-02-22 ENCOUNTER — Other Ambulatory Visit: Payer: Self-pay | Admitting: "Endocrinology

## 2018-03-03 ENCOUNTER — Ambulatory Visit: Payer: BLUE CROSS/BLUE SHIELD | Admitting: "Endocrinology

## 2018-03-12 ENCOUNTER — Other Ambulatory Visit: Payer: Self-pay | Admitting: Family Medicine

## 2018-03-19 ENCOUNTER — Other Ambulatory Visit: Payer: Self-pay | Admitting: Family Medicine

## 2018-03-19 DIAGNOSIS — Z1231 Encounter for screening mammogram for malignant neoplasm of breast: Secondary | ICD-10-CM

## 2018-03-24 ENCOUNTER — Other Ambulatory Visit: Payer: Self-pay | Admitting: Family Medicine

## 2018-03-25 ENCOUNTER — Encounter (HOSPITAL_COMMUNITY): Payer: BLUE CROSS/BLUE SHIELD

## 2018-03-30 ENCOUNTER — Other Ambulatory Visit: Payer: Self-pay | Admitting: Internal Medicine

## 2018-03-30 ENCOUNTER — Encounter: Payer: Self-pay | Admitting: Internal Medicine

## 2018-03-30 MED ORDER — SEMAGLUTIDE (1 MG/DOSE) 2 MG/1.5ML ~~LOC~~ SOPN: 1.0000 mg | PEN_INJECTOR | SUBCUTANEOUS | 5 refills | Status: DC

## 2018-04-20 ENCOUNTER — Telehealth: Payer: Self-pay

## 2018-04-20 DIAGNOSIS — E782 Mixed hyperlipidemia: Secondary | ICD-10-CM

## 2018-04-20 DIAGNOSIS — E1169 Type 2 diabetes mellitus with other specified complication: Secondary | ICD-10-CM

## 2018-04-20 DIAGNOSIS — I10 Essential (primary) hypertension: Secondary | ICD-10-CM

## 2018-04-20 DIAGNOSIS — E669 Obesity, unspecified: Principal | ICD-10-CM

## 2018-04-20 DIAGNOSIS — E559 Vitamin D deficiency, unspecified: Secondary | ICD-10-CM

## 2018-04-20 NOTE — Telephone Encounter (Signed)
Labs ordered.

## 2018-04-21 LAB — HEMOGLOBIN A1C
EAG (MMOL/L): 12.4 (calc)
HEMOGLOBIN A1C: 9.4 %{Hb} — AB (ref <5.7)
MEAN PLASMA GLUCOSE: 223 (calc)

## 2018-04-21 LAB — COMPLETE METABOLIC PANEL WITH GFR
AG RATIO: 1.4 (calc) (ref 1.0–2.5)
ALBUMIN MSPROF: 3.9 g/dL (ref 3.6–5.1)
ALKALINE PHOSPHATASE (APISO): 69 U/L (ref 33–115)
ALT: 23 U/L (ref 6–29)
AST: 23 U/L (ref 10–30)
BILIRUBIN TOTAL: 0.4 mg/dL (ref 0.2–1.2)
BUN: 10 mg/dL (ref 7–25)
CHLORIDE: 99 mmol/L (ref 98–110)
CO2: 33 mmol/L — ABNORMAL HIGH (ref 20–32)
Calcium: 9.7 mg/dL (ref 8.6–10.2)
Creat: 0.99 mg/dL (ref 0.50–1.10)
GFR, Est African American: 82 mL/min/{1.73_m2} (ref >=60)
GFR, Est Non African American: 71 mL/min/{1.73_m2} (ref >=60)
GLOBULIN: 2.7 g/dL (ref 1.9–3.7)
GLUCOSE: 143 mg/dL — AB (ref 65–99)
POTASSIUM: 3.6 mmol/L (ref 3.5–5.3)
SODIUM: 141 mmol/L (ref 135–146)
Total Protein: 6.6 g/dL (ref 6.1–8.1)

## 2018-04-21 LAB — LIPID PANEL
CHOLESTEROL: 130 mg/dL (ref <200)
HDL: 37 mg/dL — ABNORMAL LOW (ref >50)
LDL CHOLESTEROL (CALC): 79 mg/dL
Non-HDL Cholesterol (Calc): 93 mg/dL (calc) (ref <130)
TRIGLYCERIDES: 63 mg/dL (ref <150)
Total CHOL/HDL Ratio: 3.5 (calc) (ref <5.0)

## 2018-04-21 LAB — VITAMIN D 25 HYDROXY (VIT D DEFICIENCY, FRACTURES): Vit D, 25-Hydroxy: 44 ng/mL (ref 30–100)

## 2018-04-27 ENCOUNTER — Ambulatory Visit (INDEPENDENT_AMBULATORY_CARE_PROVIDER_SITE_OTHER): Payer: BLUE CROSS/BLUE SHIELD | Admitting: Family Medicine

## 2018-04-27 ENCOUNTER — Other Ambulatory Visit: Payer: Self-pay

## 2018-04-27 ENCOUNTER — Encounter: Payer: Self-pay | Admitting: Family Medicine

## 2018-04-27 ENCOUNTER — Telehealth: Payer: Self-pay | Admitting: Family Medicine

## 2018-04-27 VITALS — BP 118/78 | HR 76 | Resp 12 | Ht 62.0 in | Wt 179.0 lb

## 2018-04-27 DIAGNOSIS — R0683 Snoring: Secondary | ICD-10-CM | POA: Diagnosis not present

## 2018-04-27 DIAGNOSIS — R51 Headache: Secondary | ICD-10-CM

## 2018-04-27 DIAGNOSIS — E1169 Type 2 diabetes mellitus with other specified complication: Secondary | ICD-10-CM | POA: Diagnosis not present

## 2018-04-27 DIAGNOSIS — E669 Obesity, unspecified: Secondary | ICD-10-CM

## 2018-04-27 DIAGNOSIS — I10 Essential (primary) hypertension: Secondary | ICD-10-CM | POA: Diagnosis not present

## 2018-04-27 DIAGNOSIS — G5601 Carpal tunnel syndrome, right upper limb: Secondary | ICD-10-CM

## 2018-04-27 DIAGNOSIS — K219 Gastro-esophageal reflux disease without esophagitis: Secondary | ICD-10-CM

## 2018-04-27 DIAGNOSIS — Z23 Encounter for immunization: Secondary | ICD-10-CM

## 2018-04-27 DIAGNOSIS — G4719 Other hypersomnia: Secondary | ICD-10-CM

## 2018-04-27 DIAGNOSIS — E782 Mixed hyperlipidemia: Secondary | ICD-10-CM | POA: Diagnosis not present

## 2018-04-27 DIAGNOSIS — R519 Headache, unspecified: Secondary | ICD-10-CM

## 2018-04-27 MED ORDER — RANITIDINE HCL 150 MG PO CAPS: 150.0000 mg | ORAL_CAPSULE | Freq: Two times a day (BID) | ORAL | 3 refills | Status: DC

## 2018-04-27 NOTE — Assessment & Plan Note (Signed)
No recent symptom flare

## 2018-04-27 NOTE — Assessment & Plan Note (Signed)
Improved and reduced symptoms, uses PPI a on avg twice weekly, will change to zantac

## 2018-04-27 NOTE — Assessment & Plan Note (Signed)
Controlled, no change in medication DASH diet and commitment to daily physical activity for a minimum of 30 minutes discussed and encouraged, as a part of hypertension management. The importance of attaining a healthy weight is also discussed.  BP/Weight 04/27/2018 02/20/2018 12/03/2017 12/01/2017 10/09/2017 0/12/565 03/19/9337  Systolic BP 826 666 486 161 224 001 809  Diastolic BP 78 82 73 86 74 79 82  Wt. (Lbs) 179 187.2 185 185 185 186 176  BMI 32.74 34.24 33.84 33.84 33.84 34.02 32.19

## 2018-04-27 NOTE — Telephone Encounter (Signed)
Mailed

## 2018-04-27 NOTE — Assessment & Plan Note (Signed)
Improved Patient re-educated about  the importance of commitment to a  minimum of 150 minutes of exercise per week.  The importance of healthy food choices with portion control discussed. Encouraged to start a food diary, count calories and to consider  joining a support group. Sample diet sheets offered. Goals set by the patient for the next several months.   Weight /BMI 04/27/2018 02/20/2018 12/03/2017  WEIGHT 179 lb 187 lb 3.2 oz 185 lb  HEIGHT 5\' 2"  5\' 2"  5\' 2"   BMI 32.74 kg/m2 34.24 kg/m2 33.84 kg/m2

## 2018-04-27 NOTE — Patient Instructions (Addendum)
F/U in 5.5 months, call if you need me before  Fasting lipid, cmp and EGFR , CBC and TSH 1 week before next visit ( [facility])  Start aspirin 81 mg daily  Reduce to 1 gummy vit D daily    Flu vaccine and foot exam today  You are referred to lung Doc in [facility] for evalaution for likely sleep apnea months, call if you need me before  Flu vaccine today  Increase exerciser to 45 mins 7 days per week   I am sure that you will continue to work hard at getting blood sugar where it needs to be and so you will get there!!

## 2018-04-27 NOTE — Assessment & Plan Note (Signed)
Excessive daytime sleepiness and snoring , needs eval for sleep apnea, refer to pulmonary

## 2018-04-27 NOTE — Assessment & Plan Note (Signed)
Hyperlipidemia:Low fat diet discussed and encouraged.   Lipid Panel  Lab Results  Component Value Date   CHOL 130 04/20/2018   HDL 37 (L) 04/20/2018   LDLCALC 79 04/20/2018   TRIG 63 04/20/2018   CHOLHDL 3.5 04/20/2018   Needs to increase exercise to improve HDL

## 2018-04-27 NOTE — Progress Notes (Signed)
[name]     MRN: [id number]      DOB: 03/12/1976   HPI [name] is here for follow up and re-evaluation of chronic medical conditions, medication management and review of any available recent lab and radiology data.  Preventive health is updated, specifically  Cancer screening and Immunization.   Questions or concerns regarding consultations or procedures which the PT has had in the interim are  addressed. The PT denies any adverse reactions to current medications since the last visit.  Long h/o excessive snoring, recent h/o early moring headache and fatigue with excess daytime sleepiness  Fasting blood sufgars were as high as 300, recent change  Has them at 120 has appt with endo next month  ROS Denies recent fever or chills. Denies sinus pressure, nasal congestion, ear pain or sore throat. Denies chest congestion, productive cough or wheezing. Denies chest pains, palpitations and leg swelling Denies abdominal pain, nausea, vomiting,diarrhea or constipation.   Denies dysuria, frequency, hesitancy or incontinence. Denies joint pain, swelling and limitation in mobility. . Denies depression, anxiety or insomnia. Denies skin break down or rash.   PE  BP 118/78 (BP Location: Left Arm, Patient Position: Sitting, Cuff Size: Large)   Pulse 76   Resp 12   Ht 5\' 2"  (1.575 m)   Wt 179 lb (81.2 kg)   SpO2 98% Comment: room air  BMI 32.74 kg/m   Patient alert and oriented and in no cardiopulmonary distress.  HEENT: No facial asymmetry, EOMI,   oropharynx pink and moist.  Neck supple no JVD, no mass.  Chest: Clear to auscultation bilaterally.  CVS: S1, S2 no murmurs, no S3.Regular rate.  ABD: Soft non tender.   Ext: No edema  MS: Adequate ROM spine, shoulders, hips and knees.  Skin: Intact, no ulcerations or rash noted.  Psych: Good eye contact, normal affect. Memory intact not anxious or depressed appearing.  CNS: CN 2-12 intact, power,  normal throughout.no  focal deficits noted.   Assessment & Plan  Snoring Excessive daytime sleepiness and snoring , needs eval for sleep apnea, refer to pulmonary  Essential hypertension Controlled, no change in medication DASH diet and commitment to daily physical activity for a minimum of 30 minutes discussed and encouraged, as a part of hypertension management. The importance of attaining a healthy weight is also discussed.  BP/Weight 04/27/2018 02/20/2018 12/03/2017 12/01/2017 10/09/2017 11/02/5571 09/13/252  Systolic BP 270 623 762 831 517 616 073  Diastolic BP 78 82 73 86 74 79 82  Wt. (Lbs) 179 187.2 185 185 185 186 176  BMI 32.74 34.24 33.84 33.84 33.84 34.02 32.19       Mixed hyperlipidemia Hyperlipidemia:Low fat diet discussed and encouraged.   Lipid Panel  Lab Results  Component Value Date   CHOL 130 04/20/2018   HDL 37 (L) 04/20/2018   LDLCALC 79 04/20/2018   TRIG 63 04/20/2018   CHOLHDL 3.5 04/20/2018   Needs to increase exercise to improve HDL    Diabetes mellitus type 2 in obese ([facility]) Managed by endo , uncontrolled , but improving with mew additional drug. Pt is also highly motivated to having normal blood sugar [name] is reminded of the importance of commitment to daily physical activity for 30 minutes or more, as able and the need to limit carbohydrate intake to 30 to 60 grams per meal to help with blood sugar control.   The need to take medication as prescribed, test blood sugar as directed, and to call  between visits if there is a concern that blood sugar is uncontrolled is also discussed.   [name] is reminded of the importance of daily foot exam, annual eye examination, and good blood sugar, blood pressure and cholesterol control.  Diabetic Labs Latest Ref Rng & Units 04/20/2018 02/20/2018 11/24/2017 07/25/2017 04/22/2017  HbA1c <5.7 % of total Hgb 9.4(H) 8.8(A) 9.0(H) 8.3(H) 7.8(H)  Microalbumin mg/dL - - - 1.2 -  Micro/Creat Ratio <30 mcg/mg creat - - - 13 -  Chol  <200 mg/dL 130 - - - 119  HDL >50 mg/dL 37(L) - - - 33(L)  Calc LDL mg/dL (calc) 79 - - - 70  Triglycerides <150 mg/dL 63 - - - 82  Creatinine 0.50 - 1.10 mg/dL 0.99 - 0.99 0.83 0.80   BP/Weight 04/27/2018 02/20/2018 12/03/2017 12/01/2017 10/09/2017 08/20/3233 12/17/3218  Systolic BP 254 270 623 762 831 517 616  Diastolic BP 78 82 73 86 74 79 82  Wt. (Lbs) 179 187.2 185 185 185 186 176  BMI 32.74 34.24 33.84 33.84 33.84 34.02 32.19   Foot/eye exam completion dates Latest Ref Rng & Units 04/27/2018 10/28/2017  Eye Exam No Retinopathy - No Retinopathy  Foot exam Order - - -  Foot Form Completion - Done -        Severe carpal tunnel syndrome of right wrist No recent symptom flare   GERD (gastroesophageal reflux disease) Improved and reduced symptoms, uses PPI a on avg twice weekly, will change to zantac  Obesity (BMI 30.0-34.9) Improved Patient re-educated about  the importance of commitment to a  minimum of 150 minutes of exercise per week.  The importance of healthy food choices with portion control discussed. Encouraged to start a food diary, count calories and to consider  joining a support group. Sample diet sheets offered. Goals set by the patient for the next several months.   Weight /BMI 04/27/2018 02/20/2018 12/03/2017  WEIGHT 179 lb 187 lb 3.2 oz 185 lb  HEIGHT 5\' 2"  5\' 2"  5\' 2"   BMI 32.74 kg/m2 34.24 kg/m2 33.84 kg/m2

## 2018-04-27 NOTE — Telephone Encounter (Signed)
Please mail the Lab order to the patient from todays visit.

## 2018-04-27 NOTE — Assessment & Plan Note (Signed)
Managed by endo , uncontrolled , but improving with mew additional drug. Pt is also highly motivated to having normal blood sugar [name] is reminded of the importance of commitment to daily physical activity for 30 minutes or more, as able and the need to limit carbohydrate intake to 30 to 60 grams per meal to help with blood sugar control.   The need to take medication as prescribed, test blood sugar as directed, and to call between visits if there is a concern that blood sugar is uncontrolled is also discussed.   [name] is reminded of the importance of daily foot exam, annual eye examination, and good blood sugar, blood pressure and cholesterol control.  Diabetic Labs Latest Ref Rng & Units 04/20/2018 02/20/2018 11/24/2017 07/25/2017 04/22/2017  HbA1c <5.7 % of total Hgb 9.4(H) 8.8(A) 9.0(H) 8.3(H) 7.8(H)  Microalbumin mg/dL - - - 1.2 -  Micro/Creat Ratio <30 mcg/mg creat - - - 13 -  Chol <200 mg/dL 130 - - - 119  HDL >50 mg/dL 37(L) - - - 33(L)  Calc LDL mg/dL (calc) 79 - - - 70  Triglycerides <150 mg/dL 63 - - - 82  Creatinine 0.50 - 1.10 mg/dL 0.99 - 0.99 0.83 0.80   BP/Weight 04/27/2018 02/20/2018 12/03/2017 12/01/2017 10/09/2017 04/04/2352 01/10/4430  Systolic BP 540 086 761 950 932 671 245  Diastolic BP 78 82 73 86 74 79 82  Wt. (Lbs) 179 187.2 185 185 185 186 176  BMI 32.74 34.24 33.84 33.84 33.84 34.02 32.19   Foot/eye exam completion dates Latest Ref Rng & Units 04/27/2018 10/28/2017  Eye Exam No Retinopathy - No Retinopathy  Foot exam Order - - -  Foot Form Completion - Done -

## 2018-05-01 ENCOUNTER — Encounter (HOSPITAL_COMMUNITY): Payer: Self-pay

## 2018-05-01 ENCOUNTER — Ambulatory Visit (HOSPITAL_COMMUNITY): Payer: BLUE CROSS/BLUE SHIELD

## 2018-05-01 ENCOUNTER — Ambulatory Visit (HOSPITAL_COMMUNITY)
Admission: RE | Admit: 2018-05-01 | Discharge: 2018-05-01 | Disposition: A | Discharge status: Home / Self Care | Payer: BLUE CROSS/BLUE SHIELD | Source: Ambulatory Visit | Attending: Family Medicine | Admitting: Family Medicine

## 2018-05-01 DIAGNOSIS — Z1231 Encounter for screening mammogram for malignant neoplasm of breast: Secondary | ICD-10-CM

## 2018-05-04 ENCOUNTER — Encounter: Payer: Self-pay | Admitting: Family Medicine

## 2018-05-05 ENCOUNTER — Telehealth: Payer: Self-pay | Admitting: Family Medicine

## 2018-05-05 NOTE — Telephone Encounter (Signed)
Patient inquiring about a sleep study she said was supposed to be ordered ? I do not see this order.

## 2018-05-06 NOTE — Telephone Encounter (Signed)
Call patient to discuss her message. Patient was referred to pulmonary for excessive daytime sleepiness and snoring so they could refer for the sleep study. Left generic message requesting call back to discuss.

## 2018-05-07 NOTE — Telephone Encounter (Signed)
Spoke with patient and she is aware that pulmonary is to refer her for the sleep study. They have called and set her up for an appt.

## 2018-05-18 ENCOUNTER — Encounter: Payer: Self-pay | Admitting: Internal Medicine

## 2018-05-18 ENCOUNTER — Other Ambulatory Visit: Payer: Self-pay

## 2018-05-18 MED ORDER — INSULIN GLARGINE 100 UNIT/ML SOLOSTAR PEN: 60.0000 [IU] | PEN_INJECTOR | Freq: Every day | SUBCUTANEOUS | 2 refills | Status: DC

## 2018-06-03 ENCOUNTER — Ambulatory Visit (INDEPENDENT_AMBULATORY_CARE_PROVIDER_SITE_OTHER): Payer: BLUE CROSS/BLUE SHIELD | Admitting: Pulmonary Disease

## 2018-06-03 ENCOUNTER — Encounter: Payer: Self-pay | Admitting: Pulmonary Disease

## 2018-06-03 VITALS — BP 102/74 | HR 94 | Ht 63.0 in | Wt 178.0 lb

## 2018-06-03 DIAGNOSIS — R0683 Snoring: Secondary | ICD-10-CM

## 2018-06-03 NOTE — Progress Notes (Signed)
   Subjective:    Patient ID: ADDI PAK, female    DOB: 07/04/1976, 42 y.o.   MRN: [id number]  HPI    Review of Systems  Constitutional: Negative for fever and unexpected weight change.  HENT: Negative for congestion, dental problem, ear pain, nosebleeds, postnasal drip, rhinorrhea, sinus pressure, sneezing, sore throat and trouble swallowing.   Eyes: Negative for redness and itching.  Respiratory: Negative for cough, chest tightness, shortness of breath and wheezing.   Cardiovascular: Negative for palpitations and leg swelling.  Gastrointestinal: Negative for nausea and vomiting.  Genitourinary: Negative for dysuria.  Musculoskeletal: Negative for joint swelling.  Skin: Negative for rash.  Allergic/Immunologic: Negative.  Negative for environmental allergies, food allergies and immunocompromised state.  Neurological: Positive for headaches.  Hematological: Does not bruise/bleed easily.  Psychiatric/Behavioral: Negative for dysphoric mood. The patient is not nervous/anxious.        Objective:   Physical Exam        Assessment & Plan:

## 2018-06-03 NOTE — Patient Instructions (Signed)
Will arrange for home sleep study Will call to arrange for follow up after sleep study reviewed  

## 2018-06-03 NOTE — Progress Notes (Signed)
[facility]  Chief Complaint  Patient presents with  . sleep consult    Pt referred by Dr. Claybon Jabs MD. Pt sleeps well at night, wakes up not feeling rested.     Constitutional:  BP 102/74 (BP Location: Left Arm, Cuff Size: Normal)   Pulse 94   Ht 5\' 3"  (1.6 m)   Wt 178 lb (80.7 kg)   SpO2 99%   BMI 31.53 kg/m   Past Medical History:  GERD, HTN, DM  Brief Summary:  [name] is a 42 y.o. female with snoring.  Her husband has been worried about her snoring.  This has been getting worse.  She has trouble sleeping on her back, and wakes up frequently during dreams.  She falls asleep in the evening while watching TV.  She goes to sleep at 9 pm.  She falls asleep in few minutes.  She wakes up 1 or 2 times to use the bathroom.  She gets out of bed at 5 am.  She feels tired in the morning.  She gets morning headache.  She does not use anything to help her fall sleep or stay awake.  She denies sleep walking, sleep talking, bruxism, or nightmares.  There is no history of restless legs.  She denies sleep hallucinations, sleep paralysis, or cataplexy.  The Epworth score is 5 out of 24.     Physical Exam:   Appearance - well kempt  ENMT - clear nasal mucosa, midline nasal septum, no oral exudates, no LAN, trachea midline, MP 3 Respiratory - normal chest wall, normal respiratory effort, no accessory muscle use, no wheeze/rales CV - s1s2 regular rate and rhythm, no murmurs, no peripheral edema, radial pulses symmetric GI - soft, non tender, no masses Lymph - no adenopathy noted in neck and axillary areas MSK - normal muscle strength and tone, normal gait Ext - no cyanosis, clubbing, or joint inflammation noted Skin - no rashes, lesions, or ulcers Neuro - normal strength Psych - normal mood and affect  Discussion:  She has snoring, sleep disruption, witnessed apnea and daytime sleepiness.  She has history of hypertension.  I am  concerned she could have sleep apnea.  Assessment/Plan:   Snoring with excessive daytime sleepiness. - will need to arrange for a home sleep study  Obesity. - discussed how weight can impact sleep and risk for sleep disordered breathing - discussed options to assist with weight loss: combination of diet modification, cardiovascular and strength training exercises  Cardiovascular risk. - had an extensive discussion regarding the adverse health consequences related to untreated sleep disordered breathing - specifically discussed the risks for hypertension, coronary artery disease, cardiac dysrhythmias, cerebrovascular disease, and diabetes - lifestyle modification discussed  Safe driving practices. - discussed how sleep disruption can increase risk of accidents, particularly when driving - safe driving practices were discussed  Therapies for obstructive sleep apnea. - if the sleep study shows significant sleep apnea, then various therapies for treatment were reviewed: CPAP, oral appliance, and surgical interventions   Patient Instructions  Will arrange for home sleep study Will call to arrange for follow up after sleep study reviewed    Chesley Mires, MD [place] Pager: [phone] 06/03/2018, 3:05 PM  Flow Sheet    Sleep tests:    Cardiac tests:  Echo 11/21/17 >> EF 60 to 65%   Review of Systems:  Constitutional: Negative for fever and unexpected weight change.  HENT: Negative for congestion, dental problem, ear pain,  nosebleeds, postnasal drip, rhinorrhea, sinus pressure, sneezing, sore throat and trouble swallowing.   Eyes: Negative for redness and itching.  Respiratory: Negative for cough, chest tightness, shortness of breath and wheezing.   Cardiovascular: Negative for palpitations and leg swelling.  Gastrointestinal: Negative for nausea and vomiting.  Genitourinary: Negative for dysuria.  Musculoskeletal: Negative for joint swelling.    Skin: Negative for rash.  Allergic/Immunologic: Negative.  Negative for environmental allergies, food allergies and immunocompromised state.  Neurological: Positive for headaches.  Hematological: Does not bruise/bleed easily.  Psychiatric/Behavioral: Negative for dysphoric mood. The patient is not nervous/anxious.    Medications:   Allergies as of 06/03/2018      Reactions   Ace Inhibitors Cough   Amlodipine Besylate    REACTION: severe edema of legs      Medication List        Accurate as of 06/03/18  3:05 PM. Always use your most recent med list.          GLOBAL EASE INJECT PEN NEEDLES 31G X 8 MM Misc Generic drug:  Insulin Pen Needle USE WITH LANTUS   GLOBAL INJECT EASE LANCETS 28G Misc USE TO TEST BLOOD SUGAR TWICE DAILY   hydrochlorothiazide 25 MG tablet Commonly known as:  HYDRODIURIL TAKE 1 TABLET BY MOUTH EVERY DAY   ibuprofen 800 MG tablet Commonly known as:  ADVIL,MOTRIN Take 1 tablet by mouth 3 (three) times daily as needed.   Insulin Glargine 100 UNIT/ML Solostar Pen Commonly known as:  LANTUS Inject 60 Units into the skin daily at 10 pm.   JANUMET 50-1000 MG tablet Generic drug:  sitaGLIPtin-metformin TAKE ONE TABLET BY MOUTH TWICE DAILY   pravastatin 10 MG tablet Commonly known as:  PRAVACHOL TAKE 1 TABLET BY MOUTH AT BEDTIME.   propranolol 40 MG tablet Commonly known as:  INDERAL TAKE 1 TABLET BY MOUTH 2 TIMES DAILY.   ranitidine 150 MG capsule Commonly known as:  ZANTAC Take 1 capsule (150 mg total) by mouth 2 (two) times daily.   Semaglutide (1 MG/DOSE) 2 MG/1.5ML Sopn Inject 1 mg into the skin once a week.       Past Surgical History:  She  has a past surgical history that includes Cesarean section (6/09, 2015); Back surgery (05/25/2010); and Lumbar laminectomy/decompression microdiscectomy (Left, 09/13/2015).  Family History:  Her family history includes Diabetes in her father and mother; Hypertension in her father and mother;  Kidney disease in her father; Kidney failure in her father.  Social History:  She  reports that she has never smoked. She has never used smokeless tobacco. She reports that she does not drink alcohol or use drugs.

## 2018-06-09 ENCOUNTER — Encounter: Payer: Self-pay | Admitting: Internal Medicine

## 2018-06-09 ENCOUNTER — Ambulatory Visit (INDEPENDENT_AMBULATORY_CARE_PROVIDER_SITE_OTHER): Payer: BLUE CROSS/BLUE SHIELD | Admitting: Internal Medicine

## 2018-06-09 VITALS — BP 128/80 | HR 82 | Ht 63.0 in | Wt 178.0 lb

## 2018-06-09 DIAGNOSIS — E782 Mixed hyperlipidemia: Secondary | ICD-10-CM | POA: Diagnosis not present

## 2018-06-09 DIAGNOSIS — E1165 Type 2 diabetes mellitus with hyperglycemia: Secondary | ICD-10-CM | POA: Diagnosis not present

## 2018-06-09 DIAGNOSIS — E669 Obesity, unspecified: Secondary | ICD-10-CM

## 2018-06-09 DIAGNOSIS — Z794 Long term (current) use of insulin: Secondary | ICD-10-CM | POA: Diagnosis not present

## 2018-06-09 LAB — POCT GLYCOSYLATED HEMOGLOBIN (HGB A1C): HEMOGLOBIN A1C: 7.7 % — AB (ref 4.0–5.6)

## 2018-06-09 MED ORDER — METFORMIN HCL ER 500 MG PO TB24: 1000.0000 mg | ORAL_TABLET | Freq: Two times a day (BID) | ORAL | 3 refills | Status: DC

## 2018-06-09 NOTE — Addendum Note (Signed)
Addended by: Cardell Peach I on: 06/09/2018 02:01 PM   Modules accepted: Orders

## 2018-06-09 NOTE — Patient Instructions (Addendum)
Please continue: - Lantus 60 units at bedtime - Ozempic 1 mg weekly  Please start: - Metformin ER 1000 mg 2x a day  Please return in 3-4 months with your sugar log.

## 2018-06-09 NOTE — Progress Notes (Signed)
Patient ID: [name], female   DOB: 07-10-1976, 42 y.o.   MRN: [id number]   HPI: [name] is a 42 y.o.-year-old female, returning for f/u for DM2, dx in 2005, insulin-dependent since 2018, uncontrolled, without long-term complications.  She previously saw Dr. Dorris Fetch. First visit with me 02/2018.  Last hemoglobin A1c was: Lab Results  Component Value Date   HGBA1C 9.4 (H) 04/20/2018   HGBA1C 8.8 (A) 02/20/2018   HGBA1C 9.0 (H) 11/24/2017   Pt was on: - JanuMet 50-1000 mg 2x a day, with meals - Lantus 40 >> 60 units at bedtime - re-added 11/2017  She stopped Glipizide in 11/2017. She stopped [place] b/c increased urination. She stopped Invokana b/c yeast inf's.  At last visit, we changed to:  - Lantus 60 units at bedtime - forgets this 1x every 2 weeks - JanuMet 50-1000 mg 2x a day with meals >> ran out 4 days ago - Ozempic - started 02/2018 >> now 1 mg weekly since 03/2018- no nausea  Pt checks her sugars 2x a day: - am: 190-230 >> 100-161, 180, 249 (forgot Lantus) - 2h after b'fast: n/c >> 150-210 - before lunch: n/c >> 118, 139 - 2h after lunch: n/c >> 174-200 - before dinner: n/c >> 102-130, 151 - 2h after dinner: n/c >> 150 - bedtime: 170-190 >> 170 - nighttime: n/c Lowest sugar was 69 - pm >> 100; she has hypoglycemia awareness in the 70s.  Highest sugar was 300s - 2x >> since increasing Ozempic, 249.  She started to walk 2 miles a day 5 days a week after last visit with PCP at the beginning of 04/2018.  Glucometer: Truemetrix   Pt's meals are: - Breakfast: cereal + milk, muffin + fruit - Lunch: salad, sandwich, hamburger - Dinner: meat + veggies  - Snacks: fruit, crackers  - No CKD, last BUN/creatinine:  Lab Results  Component Value Date   BUN 10 04/20/2018   BUN 16 11/24/2017   CREATININE 0.99 04/20/2018   CREATININE 0.99 11/24/2017  Allergic to ACE inhibitors.  -+ dyslipidemia; last set of lipids: Lab Results  Component Value Date    CHOL 130 04/20/2018   HDL 37 (L) 04/20/2018   LDLCALC 79 04/20/2018   TRIG 63 04/20/2018   CHOLHDL 3.5 04/20/2018  On Pravastatin 10.  - last eye exam was in 10/2017: No DR  - no numbness and tingling in her feet.  Pt has FH of DM in M.  ROS: Constitutional: + weight loss, no fatigue, no subjective hyperthermia, no subjective hypothermia Eyes: no blurry vision, no xerophthalmia ENT: no sore throat, no nodules palpated in neck, no dysphagia, no odynophagia, no hoarseness Cardiovascular: no CP/no SOB/no palpitations/no leg swelling Respiratory: no cough/no SOB/no wheezing Gastrointestinal: no N/no V/no D/no C/no acid reflux Musculoskeletal: no muscle aches/no joint aches Skin: no rashes, no hair loss Neurological: no tremors/no numbness/no tingling/no dizziness  I reviewed pt's medications, allergies, PMH, social hx, family hx, and changes were documented in the history of present illness. Otherwise, unchanged from my initial visit note.  Past Medical History:  Diagnosis Date  . Constipation   . GERD (gastroesophageal reflux disease)   . Hypertension   . Morbid obesity ([place])   . NIDDM (non-insulin dependent diabetes mellitus) 2005   Type II   Past Surgical History:  Procedure Laterality Date  . BACK SURGERY  05/25/2010   Dr,Kabell   . CESAREAN SECTION  6/09, 2015  . LUMBAR LAMINECTOMY/DECOMPRESSION MICRODISCECTOMY Left 09/13/2015  Procedure: Left Lumbar five-Sacral one microdiskectomy;  Surgeon: Ashok Pall, MD;  Location: [facility];  Service: Neurosurgery;  Laterality: Left;  Left Lumbar five-Sacral one microdiskectomy   Social History   Socioeconomic History  . Marital status: Married    Spouse name: Not on file  . Number of children: 2  . Years of education: Not on file  . Highest education level: Not on file  Occupational History  . Occupation: unemployed   Tobacco Use  . Smoking status: Never Smoker  . Smokeless tobacco: Never Used  Substance and Sexual  Activity  . Alcohol use: No  . Drug use: No   Current Outpatient Medications on File Prior to Visit  Medication Sig Dispense Refill  . GLOBAL EASE INJECT PEN NEEDLES 31G X 8 MM MISC USE WITH LANTUS 100 each 3  . GLOBAL INJECT EASE LANCETS 28G MISC USE TO TEST BLOOD SUGAR TWICE DAILY 100 each 7  . hydrochlorothiazide (HYDRODIURIL) 25 MG tablet TAKE 1 TABLET BY MOUTH EVERY DAY 90 tablet 2  . ibuprofen (ADVIL,MOTRIN) 800 MG tablet Take 1 tablet by mouth 3 (three) times daily as needed.    . Insulin Glargine (LANTUS SOLOSTAR) 100 UNIT/ML Solostar Pen Inject 60 Units into the skin daily at 10 pm. 15 mL 2  . JANUMET 50-1000 MG tablet TAKE ONE TABLET BY MOUTH TWICE DAILY 180 tablet 1  . pravastatin (PRAVACHOL) 10 MG tablet TAKE 1 TABLET BY MOUTH AT BEDTIME. 90 tablet 2  . propranolol (INDERAL) 40 MG tablet TAKE 1 TABLET BY MOUTH 2 TIMES DAILY. 180 tablet 0  . ranitidine (ZANTAC) 150 MG capsule Take 1 capsule (150 mg total) by mouth 2 (two) times daily. 60 capsule 3  . Semaglutide (OZEMPIC) 1 MG/DOSE SOPN Inject 1 mg into the skin once a week. 2 pen 5   No current facility-administered medications on file prior to visit.    Allergies  Allergen Reactions  . Ace Inhibitors Cough  . Amlodipine Besylate     REACTION: severe edema of legs   Family History  Problem Relation Age of Onset  . Diabetes Father   . Hypertension Father   . Kidney disease Father   . Kidney failure Father   . Diabetes Mother   . Hypertension Mother     PE: BP 128/80   Pulse 82   Ht 5\' 3"  (1.6 m)   Wt 178 lb (80.7 kg)   SpO2 98%   BMI 31.53 kg/m  Wt Readings from Last 3 Encounters:  06/09/18 178 lb (80.7 kg)  06/03/18 178 lb (80.7 kg)  04/27/18 179 lb (81.2 kg)   Constitutional: overweight, in NAD Eyes: PERRLA, EOMI, no exophthalmos ENT: moist mucous membranes, no thyromegaly, no cervical lymphadenopathy Cardiovascular: RRR, No MRG Respiratory: CTA B Gastrointestinal: abdomen soft, NT, ND,  BS+ Musculoskeletal: no deformities, strength intact in all 4 Skin: moist, warm, no rashes, + acanthosis nigricans on neck Neurological: no tremor with outstretched hands, DTR normal in all 4  ASSESSMENT: 1. DM2, insulin-dependent, uncontrolled, without long-term complications, but with hyperglycemia  2. HL  3. Obesity class I  PLAN:  1. Patient with long-standing, uncontrolled, type 2 diabetes, on oral antidiabetic regimen, long-acting insulin, and now also GLP-1 receptor agonist added at last visit.  I first saw him in 02/2018 and at that time her HbA1c was 8.8%, above target.  We discussed about changing her diet to improve her insulin resistance.  We also discussed about possible reasons for her higher sugars in  the morning.  At that time I suggested a GLP-1 receptor agonist and we started a low dose of Ozempic.  Since then, we increased to the highest dose, of 1 mg weekly.  She is tolerating this well.  She lost approximately 9 pounds on the medication. - HbA1c was higher at last check, 1.5 mo ago, at 9.4%! - She started to improve her sugars after we increased the Ozempic to 1 mg daily and especially after she starting walking 2 miles a day after her last visit with PCP in 04/2018.  Her sugars improved significantly in the last month.  In fact, we decided to recheck her HbA1c even though it is only 1.5 months since last one.  The new HbA1c obtained today is much better, at 7.7%. - We will not change her medication for now, but she is now out of the Janumet and I advised her to start metformin only, without Januvia, since she is already taking a GLP-1 receptor agonist so a DPP 4 inhibitor is redundant. - I suggested to:  Patient Instructions  Please continue: - Lantus 60 units at bedtime - Ozempic 1 mg weekly  Please start: - Metformin ER 1000 mg 2x a day  Please return in 3-4 months with your sugar log.   - continue checking sugars at different times of the day - check 2x a day,  rotating checks - advised for yearly eye exams >> she is UTD - Return to clinic in 3 mo with sugar log   2. HL - Reviewed latest lipid panel from 04/2018: HDL low Lab Results  Component Value Date   CHOL 130 04/20/2018   HDL 37 (L) 04/20/2018   LDLCALC 79 04/20/2018   TRIG 63 04/20/2018   CHOLHDL 3.5 04/20/2018  - Continues Pravastatin without side effects.  3. Obesity - lost 9 lbs since last visit! - continue Ozempic and I hope we can decrease the insulin dose at next visit - Continue walking, which is working well for her  Philemon Kingdom, MD PhD [facility] Endocr[facility]ology

## 2018-06-17 DIAGNOSIS — G4733 Obstructive sleep apnea (adult) (pediatric): Secondary | ICD-10-CM | POA: Diagnosis not present

## 2018-06-18 ENCOUNTER — Other Ambulatory Visit: Payer: Self-pay | Admitting: *Deleted

## 2018-06-18 DIAGNOSIS — R0683 Snoring: Secondary | ICD-10-CM

## 2018-06-22 ENCOUNTER — Other Ambulatory Visit: Payer: Self-pay | Admitting: Family Medicine

## 2018-06-24 ENCOUNTER — Telehealth: Payer: Self-pay | Admitting: Pulmonary Disease

## 2018-06-24 ENCOUNTER — Encounter: Payer: Self-pay | Admitting: Pulmonary Disease

## 2018-06-24 DIAGNOSIS — G4733 Obstructive sleep apnea (adult) (pediatric): Secondary | ICD-10-CM | POA: Diagnosis not present

## 2018-06-24 HISTORY — DX: Obstructive sleep apnea (adult) (pediatric): G47.33

## 2018-06-24 NOTE — Telephone Encounter (Signed)
HST 06/17/18 >> AHI 7.3, SpO2 low 86%   Please let her know that the sleep study showed mild obstructive sleep apnea.  Please arrange for ROV with a nurse practitioner to review treatment options.

## 2018-06-29 NOTE — Telephone Encounter (Signed)
Attempted to call patient today regarding results. I did not receive an answer at time of call. I have left a voicemail message for pt to return call. X1  

## 2018-06-30 NOTE — Telephone Encounter (Signed)
Patient returned phone call; pt contact # [phone]

## 2018-06-30 NOTE — Telephone Encounter (Signed)
Spoke with pt and advised her of HST results and made her an appointment to see SG on Monday at 9:00 am. Nothing further is needed.

## 2018-07-06 ENCOUNTER — Encounter: Payer: Self-pay | Admitting: Acute Care

## 2018-07-06 ENCOUNTER — Ambulatory Visit: Payer: BLUE CROSS/BLUE SHIELD | Admitting: Acute Care

## 2018-07-06 NOTE — Progress Notes (Deleted)
History of Present Illness [name] is a 42 y.o. female with snoring here for evaluation of OSA. She is followed by Dr. Halford Chessman.  Synopsis [name] is a 42 y.o. female with snoring. Her husband has been worried about her snoring.  This has been getting worse.  She has trouble sleeping on her back, and wakes up frequently during dreams.  She falls asleep in the evening while watching TV. She goes to sleep at 9 pm.  She falls asleep in few minutes.  She wakes up 1 or 2 times to use the bathroom.  She gets out of bed at 5 am.  She feels tired in the morning.  She gets morning headache.  She does not use anything to help her fall sleep or stay awake. She denies sleep walking, sleep talking, bruxism, or nightmares.  There is no history of restless legs.  She denies sleep hallucinations, sleep paralysis, or cataplexy. The Epworth score was 5 out of 24 at consult on 06/03/2018.    07/06/2018 Follow up: Home Sleep Study Pt. Presents for follow up. She states she feels about the same.  Test Results: Home Sleep Study 06/17/2018 AHI = 7.3 Saturations < 90% for 2.9 minutes  CBC Latest Ref Rng & Units 04/22/2017 10/02/2015 09/13/2015  WBC 3.8 - 10.8 Thousand/uL 8.1 7.5 10.3  Hemoglobin 11.7 - 15.5 g/dL 11.4(L) 12.0 13.3  Hematocrit 35.0 - 45.0 % 35.3 36.3 40.5  Platelets 140 - 400 Thousand/uL 232 237 209    BMP Latest Ref Rng & Units 04/20/2018 11/24/2017 07/25/2017  Glucose 65 - 99 mg/dL 143(H) 192(H) 320(H)  BUN 7 - 25 mg/dL 10 16 10   Creatinine 0.50 - 1.10 mg/dL 0.99 0.99 0.83  BUN/Creat Ratio 6 - 22 (calc) NOT APPLICABLE NOT APPLICABLE NOT APPLICABLE  Sodium 161 - 146 mmol/L 141 135 136  Potassium 3.5 - 5.3 mmol/L 3.6 4.6 3.8  Chloride 98 - 110 mmol/L 99 101 97(L)  CO2 20 - 32 mmol/L 33(H) 30 31  Calcium 8.6 - 10.2 mg/dL 9.7 9.3 8.9    BNP No results found for: BNP  ProBNP No results found for: PROBNP  PFT No results found for: FEV1PRE, FEV1POST, FVCPRE,  FVCPOST, TLC, DLCOUNC, PREFEV1FVCRT, PSTFEV1FVCRT  No results found.   Past medical hx Past Medical History:  Diagnosis Date  . Constipation   . GERD (gastroesophageal reflux disease)   . Hypertension   . Morbid obesity ([place])   . NIDDM (non-insulin dependent diabetes mellitus) 2005   Type II  . OSA (obstructive sleep apnea) 06/24/2018     Social History   Tobacco Use  . Smoking status: Never Smoker  . Smokeless tobacco: Never Used  Substance Use Topics  . Alcohol use: No  . Drug use: No    Ms.Leming reports that she has never smoked. She has never used smokeless tobacco. She reports that she does not drink alcohol or use drugs.  Tobacco Cessation: Never smoker  Past surgical hx, Family hx, Social hx all reviewed.  Current Outpatient Medications on File Prior to Visit  Medication Sig  . GLOBAL EASE INJECT PEN NEEDLES 31G X 8 MM MISC USE WITH LANTUS  . GLOBAL INJECT EASE LANCETS 28G MISC USE TO TEST BLOOD SUGAR TWICE DAILY  . hydrochlorothiazide (HYDRODIURIL) 25 MG tablet TAKE 1 TABLET BY MOUTH EVERY DAY  . ibuprofen (ADVIL,MOTRIN) 800 MG tablet Take 1 tablet by mouth 3 (three) times daily as needed.  . Insulin Glargine (LANTUS SOLOSTAR) 100  UNIT/ML Solostar Pen Inject 60 Units into the skin daily at 10 pm.  . metFORMIN (GLUCOPHAGE-XR) 500 MG 24 hr tablet Take 2 tablets (1,000 mg total) by mouth 2 (two) times daily with a meal.  . pravastatin (PRAVACHOL) 10 MG tablet TAKE 1 TABLET BY MOUTH AT BEDTIME.  Marland Kitchen propranolol (INDERAL) 40 MG tablet TAKE 1 TABLET BY MOUTH 2 TIMES DAILY.  Marland Kitchen Semaglutide (OZEMPIC) 1 MG/DOSE SOPN Inject 1 mg into the skin once a week.   No current facility-administered medications on file prior to visit.      Allergies  Allergen Reactions  . Ace Inhibitors Cough  . Amlodipine Besylate     REACTION: severe edema of legs    Review Of Systems:  Constitutional:   No  weight loss, night sweats,  Fevers, chills, fatigue, or  lassitude.  HEENT:    No headaches,  Difficulty swallowing,  Tooth/dental problems, or  Sore throat,                No sneezing, itching, ear ache, nasal congestion, post nasal drip,   CV:  No chest pain,  Orthopnea, PND, swelling in lower extremities, anasarca, dizziness, palpitations, syncope.   GI  No heartburn, indigestion, abdominal pain, nausea, vomiting, diarrhea, change in bowel habits, loss of appetite, bloody stools.   Resp: No shortness of breath with exertion or at rest.  No excess mucus, no productive cough,  No non-productive cough,  No coughing up of blood.  No change in color of mucus.  No wheezing.  No chest wall deformity  Skin: no rash or lesions.  GU: no dysuria, change in color of urine, no urgency or frequency.  No flank pain, no hematuria   MS:  No joint pain or swelling.  No decreased range of motion.  No back pain.  Psych:  No change in mood or affect. No depression or anxiety.  No memory loss.   Vital Signs There were no vitals taken for this visit.   Physical Exam:  General- No distress,  A&Ox3 ENT: No sinus tenderness, TM clear, pale nasal mucosa, no oral exudate,no post nasal drip, no LAN Cardiac: S1, S2, regular rate and rhythm, no murmur Chest: No wheeze/ rales/ dullness; no accessory muscle use, no nasal flaring, no sternal retractions Abd.: Soft Non-tender Ext: No clubbing cyanosis, edema Neuro:  normal strength Skin: No rashes, warm and dry Psych: normal mood and behavior   Assessment/Plan  No problem-specific Assessment & Plan notes found for this encounter.    Magdalen Spatz, NP 07/06/2018  9:16 AM

## 2018-08-20 ENCOUNTER — Ambulatory Visit (INDEPENDENT_AMBULATORY_CARE_PROVIDER_SITE_OTHER): Payer: BLUE CROSS/BLUE SHIELD | Admitting: Family Medicine

## 2018-08-20 ENCOUNTER — Encounter: Payer: Self-pay | Admitting: Family Medicine

## 2018-08-20 VITALS — BP 106/70 | HR 97 | Resp 16 | Ht 62.0 in | Wt 171.1 lb

## 2018-08-20 DIAGNOSIS — I10 Essential (primary) hypertension: Secondary | ICD-10-CM | POA: Diagnosis not present

## 2018-08-20 DIAGNOSIS — J209 Acute bronchitis, unspecified: Secondary | ICD-10-CM

## 2018-08-20 DIAGNOSIS — E1169 Type 2 diabetes mellitus with other specified complication: Secondary | ICD-10-CM | POA: Diagnosis not present

## 2018-08-20 DIAGNOSIS — E669 Obesity, unspecified: Secondary | ICD-10-CM

## 2018-08-20 DIAGNOSIS — E782 Mixed hyperlipidemia: Secondary | ICD-10-CM | POA: Diagnosis not present

## 2018-08-20 DIAGNOSIS — E66811 Obesity, class 1: Secondary | ICD-10-CM

## 2018-08-20 MED ORDER — BENZONATATE 100 MG PO CAPS: 100.0000 mg | ORAL_CAPSULE | Freq: Two times a day (BID) | ORAL | 0 refills | Status: DC | PRN

## 2018-08-20 MED ORDER — PENICILLIN V POTASSIUM 500 MG PO TABS: 500.0000 mg | ORAL_TABLET | Freq: Three times a day (TID) | ORAL | 0 refills | Status: DC

## 2018-08-20 MED ORDER — ALBUTEROL SULFATE HFA 108 (90 BASE) MCG/ACT IN AERS: 2.0000 | INHALATION_SPRAY | Freq: Four times a day (QID) | RESPIRATORY_TRACT | 0 refills | Status: DC | PRN

## 2018-08-20 MED ORDER — PROMETHAZINE-DM 6.25-15 MG/5ML PO SYRP: ORAL_SOLUTION | ORAL | 0 refills | Status: DC

## 2018-08-20 MED ORDER — FLUCONAZOLE 150 MG PO TABS: ORAL_TABLET | ORAL | 0 refills | Status: DC

## 2018-08-20 NOTE — Patient Instructions (Signed)
F/U in March as before, call if you need me sooner  Medication is sent for acute bronchitis  Please get fasting labs 1 week before your March  Visit, you already have the order  Congrats on improved blood sugar

## 2018-08-26 ENCOUNTER — Other Ambulatory Visit: Payer: Self-pay | Admitting: Family Medicine

## 2018-08-26 ENCOUNTER — Encounter: Payer: Self-pay | Admitting: Family Medicine

## 2018-08-26 MED ORDER — PREDNISONE 5 MG (21) PO TBPK: 5.0000 mg | ORAL_TABLET | ORAL | 0 refills | Status: DC

## 2018-08-26 MED ORDER — MONTELUKAST SODIUM 10 MG PO TABS: 10.0000 mg | ORAL_TABLET | Freq: Every day | ORAL | 1 refills | Status: DC

## 2018-08-26 NOTE — Progress Notes (Signed)
pred 

## 2018-08-29 ENCOUNTER — Encounter: Payer: Self-pay | Admitting: Family Medicine

## 2018-08-29 NOTE — Assessment & Plan Note (Signed)
Hyperlipidemia:Low fat diet discussed and encouraged.   Lipid Panel  Lab Results  Component Value Date   CHOL 130 04/20/2018   HDL 37 (L) 04/20/2018   LDLCALC 79 04/20/2018   TRIG 63 04/20/2018   CHOLHDL 3.5 04/20/2018   Needs to increase exercise

## 2018-08-29 NOTE — Assessment & Plan Note (Signed)
DASH diet and commitment to daily physical activity for a minimum of 30 minutes discussed and encouraged, as a part of hypertension management. The importance of attaining a healthy weight is also discussed.  BP/Weight 08/20/2018 06/09/2018 06/03/2018 04/27/2018 02/20/2018 12/03/2017 0/93/2671  Systolic BP 245 809 983 382 505 397 673  Diastolic BP 70 80 74 78 82 73 86  Wt. (Lbs) 171.12 178 178 179 187.2 185 185  BMI 31.3 31.53 31.53 32.74 34.24 33.84 33.84    .

## 2018-08-29 NOTE — Assessment & Plan Note (Signed)
Improved. Patient re-educated about  the importance of commitment to a  minimum of 150 minutes of exercise per week.  The importance of healthy food choices with portion control discussed. Encouraged to start a food diary, count calories and to consider  joining a support group. Sample diet sheets offered. Goals set by the patient for the next several months.   Weight /BMI 08/20/2018 06/09/2018 06/03/2018  WEIGHT 171 lb 1.9 oz 178 lb 178 lb  HEIGHT 5\' 2"  5\' 3"  5\' 3"   BMI 31.3 kg/m2 31.53 kg/m2 31.53 kg/m2

## 2018-08-29 NOTE — Assessment & Plan Note (Signed)
[name] is reminded of the importance of commitment to daily physical activity for 30 minutes or more, as able and the need to limit carbohydrate intake to 30 to 60 grams per meal to help with blood sugar control.   The need to take medication as prescribed, test blood sugar as directed, and to call between visits if there is a concern that blood sugar is uncontrolled is also discussed.   [name] is reminded of the importance of daily foot exam, annual eye examination, and good blood sugar, blood pressure and cholesterol control. Improved, managed by Endo Diabetic Labs Latest Ref Rng & Units 06/09/2018 04/20/2018 02/20/2018 11/24/2017 07/25/2017  HbA1c 4.0 - 5.6 % 7.7(A) 9.4(H) 8.8(A) 9.0(H) 8.3(H)  Microalbumin mg/dL - - - - 1.2  Micro/Creat Ratio <30 mcg/mg creat - - - - 13  Chol <200 mg/dL - 130 - - -  HDL >50 mg/dL - 37(L) - - -  Calc LDL mg/dL (calc) - 79 - - -  Triglycerides <150 mg/dL - 63 - - -  Creatinine 0.50 - 1.10 mg/dL - 0.99 - 0.99 0.83   BP/Weight 08/20/2018 06/09/2018 06/03/2018 04/27/2018 02/20/2018 12/03/2017 4/43/1540  Systolic BP 086 761 950 932 671 245 809  Diastolic BP 70 80 74 78 82 73 86  Wt. (Lbs) 171.12 178 178 179 187.2 185 185  BMI 31.3 31.53 31.53 32.74 34.24 33.84 33.84   Foot/eye exam completion dates Latest Ref Rng & Units 04/27/2018 10/28/2017  Eye Exam No Retinopathy - No Retinopathy  Foot exam Order - - -  Foot Form Completion - Done -

## 2018-08-29 NOTE — Assessment & Plan Note (Addendum)
Antibiotic and decongestant prescribed and cough suppressant, also CXR,

## 2018-08-29 NOTE — Progress Notes (Signed)
[name]     MRN: [id number]      DOB: Dec 22, 1975   HPI [name] is here for follow up and re-evaluation of chronic medical conditions, medication management and review of any available recent lab and radiology data.  Preventive health is updated, specifically  Cancer screening and Immunization.   Questions or concerns regarding consultations or procedures which the PT has had in the interim are  addressed. The PT denies any adverse reactions to current medications since the last visit.  3 week h/o cough and chest congestion ROS Denies recent fever or chills. Denies sinus pressure, nasal congestion, ear pain or sore throat. Denies chest pains, palpitations and leg swelling Denies abdominal pain, nausea, vomiting,diarrhea or constipation.   Denies dysuria, frequency, hesitancy or incontinence. Denies joint pain, swelling and limitation in mobility. Denies headaches, seizures, numbness, or tingling. Denies depression, anxiety or insomnia. Denies skin break down or rash.   PE  BP 106/70 (BP Location: Left Arm, Patient Position: Sitting, Cuff Size: Large)   Pulse 97   Resp 16   Ht 5\' 2"  (1.575 m)   Wt 171 lb 1.9 oz (77.6 kg)   SpO2 96% Comment: room air  BMI 31.30 kg/m   Patient alert and oriented and in no cardiopulmonary distress.  HEENT: No facial asymmetry, EOMI,   oropharynx pink and moist.  Neck supple no JVD, no mass.  Chest: adequate though reduced air entry , scattered crackles  CVS: S1, S2 no murmurs, no S3.Regular rate.  ABD: Soft non tender.   Ext: No edema  MS: Adequate ROM spine, shoulders, hips and knees.  Skin: Intact, no ulcerations or rash noted.  Psych: Good eye contact, normal affect. Memory intact not anxious or depressed appearing.  CNS: CN 2-12 intact, power,  normal throughout.no focal deficits noted.   Assessment & Plan  Acute bronchitis Antibiotic and decongestant prescribed and cough suppressant, also CXR,   Essential  hypertension DASH diet and commitment to daily physical activity for a minimum of 30 minutes discussed and encouraged, as a part of hypertension management. The importance of attaining a healthy weight is also discussed.  BP/Weight 08/20/2018 06/09/2018 06/03/2018 04/27/2018 02/20/2018 12/03/2017 0/16/0109  Systolic BP 323 557 322 025 427 062 376  Diastolic BP 70 80 74 78 82 73 86  Wt. (Lbs) 171.12 178 178 179 187.2 185 185  BMI 31.3 31.53 31.53 32.74 34.24 33.84 33.84    .  Obesity (BMI 30.0-34.9) Improved. Patient re-educated about  the importance of commitment to a  minimum of 150 minutes of exercise per week.  The importance of healthy food choices with portion control discussed. Encouraged to start a food diary, count calories and to consider  joining a support group. Sample diet sheets offered. Goals set by the patient for the next several months.   Weight /BMI 08/20/2018 06/09/2018 06/03/2018  WEIGHT 171 lb 1.9 oz 178 lb 178 lb  HEIGHT 5\' 2"  5\' 3"  5\' 3"   BMI 31.3 kg/m2 31.53 kg/m2 31.53 kg/m2      Diabetes mellitus type 2 in obese [facility]) [name] is reminded of the importance of commitment to daily physical activity for 30 minutes or more, as able and the need to limit carbohydrate intake to 30 to 60 grams per meal to help with blood sugar control.   The need to take medication as prescribed, test blood sugar as directed, and to call between visits if there is a concern that blood sugar is uncontrolled is  also discussed.   [name] is reminded of the importance of daily foot exam, annual eye examination, and good blood sugar, blood pressure and cholesterol control. Improved, managed by Endo Diabetic Labs Latest Ref Rng & Units 06/09/2018 04/20/2018 02/20/2018 11/24/2017 07/25/2017  HbA1c 4.0 - 5.6 % 7.7(A) 9.4(H) 8.8(A) 9.0(H) 8.3(H)  Microalbumin mg/dL - - - - 1.2  Micro/Creat Ratio <30 mcg/mg creat - - - - 13  Chol <200 mg/dL - 130 - - -  HDL >50 mg/dL - 37(L) - - -  Calc  LDL mg/dL (calc) - 79 - - -  Triglycerides <150 mg/dL - 63 - - -  Creatinine 0.50 - 1.10 mg/dL - 0.99 - 0.99 0.83   BP/Weight 08/20/2018 06/09/2018 06/03/2018 04/27/2018 02/20/2018 12/03/2017 4/59/9774  Systolic BP 142 395 320 233 435 686 168  Diastolic BP 70 80 74 78 82 73 86  Wt. (Lbs) 171.12 178 178 179 187.2 185 185  BMI 31.3 31.53 31.53 32.74 34.24 33.84 33.84   Foot/eye exam completion dates Latest Ref Rng & Units 04/27/2018 10/28/2017  Eye Exam No Retinopathy - No Retinopathy  Foot exam Order - - -  Foot Form Completion - Done -        Mixed hyperlipidemia Hyperlipidemia:Low fat diet discussed and encouraged.   Lipid Panel  Lab Results  Component Value Date   CHOL 130 04/20/2018   HDL 37 (L) 04/20/2018   LDLCALC 79 04/20/2018   TRIG 63 04/20/2018   CHOLHDL 3.5 04/20/2018   Needs to increase exercise

## 2018-09-06 ENCOUNTER — Other Ambulatory Visit: Payer: Self-pay | Admitting: Internal Medicine

## 2018-09-20 ENCOUNTER — Other Ambulatory Visit: Payer: Self-pay | Admitting: Family Medicine

## 2018-09-29 ENCOUNTER — Ambulatory Visit: Payer: BLUE CROSS/BLUE SHIELD | Admitting: Internal Medicine

## 2018-10-14 ENCOUNTER — Ambulatory Visit: Payer: BLUE CROSS/BLUE SHIELD | Admitting: Family Medicine

## 2018-10-26 ENCOUNTER — Encounter: Payer: Self-pay | Admitting: Internal Medicine

## 2018-10-26 MED ORDER — SEMAGLUTIDE (1 MG/DOSE) 2 MG/1.5ML ~~LOC~~ SOPN: 1.0000 mg | PEN_INJECTOR | SUBCUTANEOUS | 2 refills | Status: DC

## 2018-11-02 ENCOUNTER — Telehealth: Payer: Self-pay | Admitting: Internal Medicine

## 2018-11-02 NOTE — Telephone Encounter (Signed)
PA has been faxed.

## 2018-11-02 NOTE — Telephone Encounter (Signed)
Patient stated paperwork was going to be done in order for her to receive her ozempic. She would like a call back with the status of this.

## 2018-11-02 NOTE — Telephone Encounter (Signed)
Currently the PA is in process.

## 2018-11-09 ENCOUNTER — Other Ambulatory Visit: Payer: Self-pay | Admitting: Family Medicine

## 2018-11-09 ENCOUNTER — Ambulatory Visit: Payer: BLUE CROSS/BLUE SHIELD | Admitting: Family Medicine

## 2018-11-09 ENCOUNTER — Ambulatory Visit: Payer: BLUE CROSS/BLUE SHIELD | Admitting: Internal Medicine

## 2018-11-10 ENCOUNTER — Encounter: Payer: Self-pay | Admitting: Internal Medicine

## 2018-11-17 ENCOUNTER — Telehealth: Payer: Self-pay

## 2018-11-17 NOTE — Telephone Encounter (Signed)
Called [facility] [employer] to f/u on status of PA for Ozempic. Spoke with Lesly Rubenstein who transferred me to [place], [place] #[phone]. States faxed PA was not received. Provided with clinical information to proceed with re-initiation of PA. VX#[phone]. States to call to f/u on status of PA within 24 hours of today's call.

## 2018-12-18 ENCOUNTER — Other Ambulatory Visit: Payer: Self-pay | Admitting: Family Medicine

## 2018-12-31 ENCOUNTER — Telehealth: Payer: Self-pay

## 2018-12-31 ENCOUNTER — Encounter: Payer: Self-pay | Admitting: Family Medicine

## 2018-12-31 DIAGNOSIS — I10 Essential (primary) hypertension: Secondary | ICD-10-CM

## 2018-12-31 DIAGNOSIS — E782 Mixed hyperlipidemia: Secondary | ICD-10-CM

## 2018-12-31 NOTE — Telephone Encounter (Signed)
Expired labs reordered  

## 2019-01-01 ENCOUNTER — Other Ambulatory Visit: Payer: Self-pay

## 2019-01-01 DIAGNOSIS — E782 Mixed hyperlipidemia: Secondary | ICD-10-CM | POA: Diagnosis not present

## 2019-01-01 DIAGNOSIS — I10 Essential (primary) hypertension: Secondary | ICD-10-CM | POA: Diagnosis not present

## 2019-01-02 LAB — COMPLETE METABOLIC PANEL WITH GFR
AG Ratio: 1.5 (calc) (ref 1.0–2.5)
ALT: 21 U/L (ref 6–29)
AST: 17 U/L (ref 10–30)
Albumin: 4.1 g/dL (ref 3.6–5.1)
Alkaline phosphatase (APISO): 74 U/L (ref 31–125)
BUN: 9 mg/dL (ref 7–25)
CO2: 34 mmol/L — ABNORMAL HIGH (ref 20–32)
Calcium: 9.4 mg/dL (ref 8.6–10.2)
Chloride: 97 mmol/L — ABNORMAL LOW (ref 98–110)
Creat: 0.83 mg/dL (ref 0.50–1.10)
GFR, Est African American: 102 mL/min/{1.73_m2} (ref >=60)
GFR, Est Non African American: 88 mL/min/{1.73_m2} (ref >=60)
Globulin: 2.7 g/dL (calc) (ref 1.9–3.7)
Glucose, Bld: 164 mg/dL — ABNORMAL HIGH (ref 65–99)
Potassium: 3.5 mmol/L (ref 3.5–5.3)
Sodium: 138 mmol/L (ref 135–146)
Total Bilirubin: 0.4 mg/dL (ref 0.2–1.2)
Total Protein: 6.8 g/dL (ref 6.1–8.1)

## 2019-01-02 LAB — CBC
HCT: 37.4 % (ref 35.0–45.0)
Hemoglobin: 11.8 g/dL (ref 11.7–15.5)
MCH: 25.4 pg — ABNORMAL LOW (ref 27.0–33.0)
MCHC: 31.6 g/dL — ABNORMAL LOW (ref 32.0–36.0)
MCV: 80.4 fL (ref 80.0–100.0)
MPV: 12.8 fL — ABNORMAL HIGH (ref 7.5–12.5)
Platelets: 201 10*3/uL (ref 140–400)
RBC: 4.65 10*6/uL (ref 3.80–5.10)
RDW: 13 % (ref 11.0–15.0)
WBC: 8.2 10*3/uL (ref 3.8–10.8)

## 2019-01-02 LAB — LIPID PANEL
Cholesterol: 125 mg/dL (ref <200)
HDL: 39 mg/dL — ABNORMAL LOW (ref >=50)
LDL Cholesterol (Calc): 73 mg/dL (calc)
Non-HDL Cholesterol (Calc): 86 mg/dL (calc) (ref <130)
Total CHOL/HDL Ratio: 3.2 (calc) (ref <5.0)
Triglycerides: 54 mg/dL (ref <150)

## 2019-01-02 LAB — TSH: TSH: 0.62 mIU/L

## 2019-01-05 ENCOUNTER — Encounter: Payer: Self-pay | Admitting: Internal Medicine

## 2019-01-05 ENCOUNTER — Other Ambulatory Visit: Payer: Self-pay

## 2019-01-05 ENCOUNTER — Ambulatory Visit (INDEPENDENT_AMBULATORY_CARE_PROVIDER_SITE_OTHER): Payer: Medicaid Other | Admitting: Internal Medicine

## 2019-01-05 VITALS — BP 118/80 | HR 72 | Ht 62.0 in | Wt 170.0 lb

## 2019-01-05 DIAGNOSIS — E669 Obesity, unspecified: Secondary | ICD-10-CM

## 2019-01-05 DIAGNOSIS — E1169 Type 2 diabetes mellitus with other specified complication: Secondary | ICD-10-CM

## 2019-01-05 DIAGNOSIS — E782 Mixed hyperlipidemia: Secondary | ICD-10-CM

## 2019-01-05 LAB — POCT GLYCOSYLATED HEMOGLOBIN (HGB A1C): Hemoglobin A1C: 8.6 % — AB (ref 4.0–5.6)

## 2019-01-05 MED ORDER — DAPAGLIFLOZIN PROPANEDIOL 5 MG PO TABS: 5.0000 mg | ORAL_TABLET | Freq: Every day | ORAL | 11 refills | Status: DC

## 2019-01-05 MED ORDER — SEMAGLUTIDE (1 MG/DOSE) 2 MG/1.5ML ~~LOC~~ SOPN: 1.0000 mg | PEN_INJECTOR | SUBCUTANEOUS | 3 refills | Status: DC

## 2019-01-05 NOTE — Progress Notes (Signed)
Patient ID: [name], female   DOB: 13-Mar-1976, 43 y.o.   MRN: [id number]   HPI: [name] is a 43 y.o.-year-old female, returning for f/u for DM2, dx in 2005, insulin-dependent since 2018, uncontrolled, without long-term complications.  She previously saw Dr. Dorris Fetch. First visit with me 02/2018.  Last visit 7 months ago.  Last hemoglobin A1c was: Lab Results  Component Value Date   HGBA1C 7.7 (A) 06/09/2018   HGBA1C 9.4 (H) 04/20/2018   HGBA1C 8.8 (A) 02/20/2018   Pt was on: - JanuMet 50-1000 mg 2x a day, with meals - Lantus 40 >> 60 units at bedtime - re-added 11/2017  She stopped Glipizide in 11/2017. She stopped [place] b/c increased urination. She stopped Invokana b/c yeast inf's.  She is currently on:  - Metformin ER 1000 mg twice a day with meals - started 05/2018 >> cannot tolerate it   - Ozempic - started 02/2018 >> now 1 mg weekly since 03/2018-no nausea. She was off this for 2 months - restarted 1 mo ago after PA completed. - Lantus 60 units at bedtime She was previously on Janumet.  Pt checks her sugars 1x a day: - am: 190-230 >> 100-161, 180, 249 (forgot Lantus) >> last 3 weeks: 155-365, but improving on Ozempic:111-195 - 2h after b'fast: n/c >> 150-210 >> n/c - before lunch: n/c >> 118, 139 >> n/c - 2h after lunch: n/c >> 174-200 >> n/c - before dinner: n/c >> 102-130, 151 >> n/c - 2h after dinner: n/c >> 150 >> 165 - bedtime: 170-190 >> 170 >> n/c - nighttime: n/c Lowest sugar was 69 - pm >> 100 >> 111; she has hypoglycemia awareness in the 70s.  Highest sugar was 300s - 2x >> since increasing Ozempic, 249 >> 365.  Continues to walk for exercise.  Glucometer: Truemetrix   Pt's meals are: - Breakfast: cereal + milk, muffin + fruit - Lunch: salad, sandwich, hamburger - Dinner: meat + veggies  - Snacks: fruit, crackers  -No CKD, last BUN/creatinine:  Lab Results  Component Value Date   BUN 9 01/01/2019   BUN 10 04/20/2018   CREATININE  0.83 01/01/2019   CREATININE 0.99 04/20/2018  She is allergic to ACE inhibitors.  + Dyslipidemia; last set of lipids: Lab Results  Component Value Date   CHOL 125 01/01/2019   HDL 39 (L) 01/01/2019   LDLCALC 73 01/01/2019   TRIG 54 01/01/2019   CHOLHDL 3.2 01/01/2019  On pravastatin 10.  - last eye exam was in 10/2017: No DR.  -No numbness and tingling in her feet.  Pt has FH of DM in M.  ROS: Constitutional: no weight gain/+ weight loss, no fatigue, no subjective hyperthermia, no subjective hypothermia Eyes: no blurry vision, no xerophthalmia ENT: no sore throat, no nodules palpated in neck, no dysphagia, no odynophagia, no hoarseness Cardiovascular: no CP/no SOB/no palpitations/no leg swelling Respiratory: no cough/no SOB/no wheezing Gastrointestinal: no N/no V/+ D/no C/no acid reflux Musculoskeletal: no muscle aches/no joint aches Skin: no rashes, no hair loss Neurological: no tremors/no numbness/no tingling/no dizziness  I reviewed pt's medications, allergies, PMH, social hx, family hx, and changes were documented in the history of present illness. Otherwise, unchanged from my initial visit note.  Past Medical History:  Diagnosis Date  . Constipation   . GERD (gastroesophageal reflux disease)   . Hypertension   . Morbid obesity ([place])   . NIDDM (non-insulin dependent diabetes mellitus) 2005   Type II  . OSA (obstructive  sleep apnea) 06/24/2018   Past Surgical History:  Procedure Laterality Date  . BACK SURGERY  05/25/2010   Dr,Kabell   . CESAREAN SECTION  6/09, 2015  . LUMBAR LAMINECTOMY/DECOMPRESSION MICRODISCECTOMY Left 09/13/2015   Procedure: Left Lumbar five-Sacral one microdiskectomy;  Surgeon: Ashok Pall, MD;  Location: [facility];  Service: Neurosurgery;  Laterality: Left;  Left Lumbar five-Sacral one microdiskectomy   Social History   Socioeconomic History  . Marital status: Married    Spouse name: Not on file  . Number of children: 2  . Years of  education: Not on file  . Highest education level: Not on file  Occupational History  . Occupation: unemployed   Tobacco Use  . Smoking status: Never Smoker  . Smokeless tobacco: Never Used  Substance and Sexual Activity  . Alcohol use: No  . Drug use: No   Current Outpatient Medications on File Prior to Visit  Medication Sig Dispense Refill  . ACCU-CHEK AVIVA PLUS test strip USE TO TEST BLOOD SUGAR THREE TIMES DAILY 100 each 5  . albuterol (PROVENTIL HFA;VENTOLIN HFA) 108 (90 Base) MCG/ACT inhaler Inhale 2 puffs into the lungs every 6 (six) hours as needed for wheezing or shortness of breath. 1 Inhaler 0  . benzonatate (TESSALON) 100 MG capsule Take 1 capsule (100 mg total) by mouth 2 (two) times daily as needed. 14 capsule 0  . fluconazole (DIFLUCAN) 150 MG tablet Take on tablet once daily , as needed,  For vaginal itch 2 tablet 0  . GLOBAL EASE INJECT PEN NEEDLES 31G X 8 MM MISC USE WITH LANTUS 100 each 3  . GLOBAL INJECT EASE LANCETS 28G MISC USE TO TEST BLOOD SUGAR TWICE DAILY 100 each 7  . hydrochlorothiazide (HYDRODIURIL) 25 MG tablet TAKE 1 TABLET BY MOUTH EVERY DAY 30 tablet 0  . ibuprofen (ADVIL,MOTRIN) 800 MG tablet Take 1 tablet by mouth 3 (three) times daily as needed.    . Insulin Glargine (LANTUS SOLOSTAR) 100 UNIT/ML Solostar Pen Inject 60 Units into the skin daily at 10 pm. 15 mL 2  . metFORMIN (GLUCOPHAGE-XR) 500 MG 24 hr tablet Take 2 tablets (1,000 mg total) by mouth 2 (two) times daily with a meal. 360 tablet 3  . montelukast (SINGULAIR) 10 MG tablet Take 1 tablet (10 mg total) by mouth at bedtime. 30 tablet 1  . penicillin v potassium (VEETID) 500 MG tablet Take 1 tablet (500 mg total) by mouth 3 (three) times daily. 30 tablet 0  . pravastatin (PRAVACHOL) 10 MG tablet TAKE 1 TABLET BY MOUTH AT BEDTIME. 90 tablet 2  . predniSONE (STERAPRED UNI-PAK 21 TAB) 5 MG (21) TBPK tablet Take 1 tablet (5 mg total) by mouth as directed. Use as directed 21 tablet 0  .  promethazine-dextromethorphan (PROMETHAZINE-DM) 6.25-15 MG/5ML syrup One teaspoon at bedtime as needed for excessive cough 240 mL 0  . propranolol (INDERAL) 40 MG tablet TAKE 1 TABLET BY MOUTH TWICE DAILY 60 tablet 0  . Semaglutide, 1 MG/DOSE, (OZEMPIC, 1 MG/DOSE,) 2 MG/1.5ML SOPN Inject 1 mg into the skin once a week. 4 pen 2   No current facility-administered medications on file prior to visit.    Allergies  Allergen Reactions  . Ace Inhibitors Cough  . Amlodipine Besylate     REACTION: severe edema of legs   Family History  Problem Relation Age of Onset  . Diabetes Father   . Hypertension Father   . Kidney disease Father   . Kidney failure Father   .  Diabetes Mother   . Hypertension Mother     PE: BP 118/80   Pulse 72   Ht 5\' 2"  (1.575 m)   Wt 170 lb (77.1 kg)   SpO2 98%   BMI 31.09 kg/m  Wt Readings from Last 3 Encounters:  01/05/19 170 lb (77.1 kg)  08/20/18 171 lb 1.9 oz (77.6 kg)  06/09/18 178 lb (80.7 kg)   Constitutional: overweight, in NAD Eyes: PERRLA, EOMI, no exophthalmos ENT: moist mucous membranes, no thyromegaly, no cervical lymphadenopathy Cardiovascular: RRR, No MRG Respiratory: CTA B Gastrointestinal: abdomen soft, NT, ND, BS+ Musculoskeletal: no deformities, strength intact in all 4 Skin: moist, warm, no rashes, + acanthosis nigricans on neck Neurological: no tremor with outstretched hands, DTR normal in all 4  ASSESSMENT: 1. DM2, insulin-dependent, uncontrolled, without long-term complications, but with hyperglycemia  2. HL  3. Obesity class I  PLAN:  1. Patient with longstanding, uncontrolled, type 2 diabetes, on oral antidiabetic regimen, and also weekly GLP-1 receptor agonist, and long-acting insulin. At last visit, the HbA1c was much better at 7.7%, decreased from 9.4%. She did much better after starting a GLP-1 receptor agonist and also after increasing the dose to 1 mg weekly.  She tolerates the medication well.  Before last visit she  also started to walk consistently and this also helped her sugars improve.  However, unfortunately, Ozempic stopped being covered by her insurance despite 2 preauthorization request.  Her sugars started to increase and they were in the 300s in 10/2018 when she was off Ozempic.  She restarted it a week ago and sugar started to improve but they are still above goal.  Unfortunately, she has not tolerated well the metformin ER.  This is interesting, since she tolerated well Janumet.  In the future, we may need to restart Janumet, in addition to [place].  For now, I advised her to start decreasing the dose of metformin slowly and stay on the tolerated dose.  If she cannot tolerate even 1 tablet the day, she needs to stop completely. -For now, however, I suggested to add an SGLT 2 inhibitor and we discussed about benefits beyond improving her sugars.  This can also help with cardiovascular and kidney outcomes, and also her weight.  She agrees to try [place], but since she had increased urination in the past with this, I suggested to start it at a lower dose.  I advised her to let me know if the sugars remain high after adding Farxiga, in which case, we can try Janumet.  I will leave mealtime insulin as the last resort for her. - I suggested to:  Patient Instructions  Please continue: - Lantus 60 units at bedtime - Ozempic 1 mg weekly  Stop Metformin.  Try to start: - Farxiga 5 mg before b'fast  Please return in 3-4 months with your sugar log.  - continue checking sugars at different times of the day - check 2x a day, rotating checks - advised for yearly eye exams >> she is not UTD - Return to clinic in 3-4 mo with sugar log   2. HL - Reviewed latest lipid panel from 12/2018: HDL low Lab Results  Component Value Date   CHOL 125 01/01/2019   HDL 39 (L) 01/01/2019   LDLCALC 73 01/01/2019   TRIG 54 01/01/2019   CHOLHDL 3.2 01/01/2019  - Continues pravastatin without side effects.  3.  Obesity -After starting Ozempic, she lost 9 pounds before last visit, 8 more lbs since then -We  will continue Ozempic which should also help with weight loss; will also add Jearld Shines, MD PhD [facility]

## 2019-01-05 NOTE — Patient Instructions (Addendum)
Please continue: - Lantus 60 units at bedtime - Ozempic 1 mg weekly  Stop Metformin.  Try to start: - Farxiga 5 mg before b'fast  Please return in 3-4 months with your sugar log.

## 2019-01-06 ENCOUNTER — Ambulatory Visit (INDEPENDENT_AMBULATORY_CARE_PROVIDER_SITE_OTHER): Payer: Medicaid Other | Admitting: Family Medicine

## 2019-01-06 ENCOUNTER — Encounter: Payer: Self-pay | Admitting: Family Medicine

## 2019-01-06 VITALS — BP 112/64 | HR 79 | Resp 12 | Ht 62.0 in | Wt 171.0 lb

## 2019-01-06 DIAGNOSIS — I10 Essential (primary) hypertension: Secondary | ICD-10-CM

## 2019-01-06 DIAGNOSIS — Z1231 Encounter for screening mammogram for malignant neoplasm of breast: Secondary | ICD-10-CM

## 2019-01-06 DIAGNOSIS — Z0000 Encounter for general adult medical examination without abnormal findings: Secondary | ICD-10-CM | POA: Diagnosis not present

## 2019-01-06 DIAGNOSIS — Z6831 Body mass index (BMI) 31.0-31.9, adult: Secondary | ICD-10-CM | POA: Diagnosis not present

## 2019-01-06 DIAGNOSIS — E1169 Type 2 diabetes mellitus with other specified complication: Secondary | ICD-10-CM

## 2019-01-06 DIAGNOSIS — E669 Obesity, unspecified: Secondary | ICD-10-CM | POA: Diagnosis not present

## 2019-01-06 DIAGNOSIS — E782 Mixed hyperlipidemia: Secondary | ICD-10-CM | POA: Diagnosis not present

## 2019-01-06 DIAGNOSIS — M5432 Sciatica, left side: Secondary | ICD-10-CM

## 2019-01-06 NOTE — Patient Instructions (Addendum)
F/U in 6 months, call if you need me before  Fasting lipid, cmp and EGFr in 2nd week in November  Please send microalb from office today  Labs and exam are good  Need to increase   Exercise commitment to build good cholesterol  Mammogram will be scheduled for Sept 21 or after and appt m given  Think about what you will eat, plan ahead. Choose " clean, green, fresh or frozen" over canned, processed or packaged foods which are more sugary, salty and fatty. 70 to 75% of food eaten should be vegetables and fruit. Three meals at set times with snacks allowed between meals, but they must be fruit or vegetables. Aim to eat over a 12 hour period , example 7 am to 7 pm, and STOP after  your last meal of the day. Drink water,generally about 64 ounces per day, no other drink is as healthy. Fruit juice is best enjoyed in a healthy way, by EATING the fruit.   It is important that you exercise regularly at least 30 minutes 5 times a week. If you develop chest pain, have severe difficulty breathing, or feel very tired, stop exercising immediately and seek medical attention

## 2019-01-07 ENCOUNTER — Other Ambulatory Visit (HOSPITAL_COMMUNITY)
Admission: RE | Admit: 2019-01-07 | Discharge: 2019-01-07 | Disposition: A | Discharge status: Home / Self Care | Payer: Medicaid Other | Source: Other Acute Inpatient Hospital | Attending: Family Medicine | Admitting: Family Medicine

## 2019-01-07 DIAGNOSIS — E1169 Type 2 diabetes mellitus with other specified complication: Secondary | ICD-10-CM | POA: Diagnosis not present

## 2019-01-07 DIAGNOSIS — E669 Obesity, unspecified: Secondary | ICD-10-CM | POA: Diagnosis not present

## 2019-01-08 LAB — MICROALBUMIN / CREATININE URINE RATIO
Creatinine, Urine: 148 mg/dL
Microalb Creat Ratio: 13 mg/g creat (ref 0–29)
Microalb, Ur: 19.5 ug/mL — ABNORMAL HIGH

## 2019-01-10 NOTE — Assessment & Plan Note (Signed)
Hyperlipidemia:Low fat diet discussed and encouraged.   Lipid Panel  Lab Results  Component Value Date   CHOL 125 01/01/2019   HDL 39 (L) 01/01/2019   LDLCALC 73 01/01/2019   TRIG 54 01/01/2019   CHOLHDL 3.2 01/01/2019   Needs to increase exercise , no med change

## 2019-01-10 NOTE — Assessment & Plan Note (Signed)
Controlled, no change in medication DASH diet and commitment to daily physical activity for a minimum of 30 minutes discussed and encouraged, as a part of hypertension management. The importance of attaining a healthy weight is also discussed.  BP/Weight 01/06/2019 01/05/2019 08/20/2018 06/09/2018 06/03/2018 04/27/2018 08/21/2109  Systolic BP 735 670 141 030 131 438 887  Diastolic BP 64 80 70 80 74 78 82  Wt. (Lbs) 171 170 171.12 178 178 179 187.2  BMI 31.28 31.09 31.3 31.53 31.53 32.74 34.24

## 2019-01-10 NOTE — Assessment & Plan Note (Signed)
[name] is reminded of the importance of commitment to daily physical activity for 30 minutes or more, as able and the need to limit carbohydrate intake to 30 to 60 grams per meal to help with blood sugar control.   The need to take medication as prescribed, test blood sugar as directed, and to call between visits if there is a concern that blood sugar is uncontrolled is also discussed.   [name] is reminded of the importance of daily foot exam, annual eye examination, and good blood sugar, blood pressure and cholesterol control.  Diabetic Labs Latest Ref Rng & Units 01/07/2019 01/05/2019 01/01/2019 06/09/2018 04/20/2018  HbA1c 4.0 - 5.6 % - 8.6(A) - 7.7(A) 9.4(H)  Microalbumin Not Estab. ug/mL 19.5(H) - - - -  Micro/Creat Ratio 0 - 29 mg/g creat 13 - - - -  Chol <200 mg/dL - - 125 - 130  HDL > OR = 50 mg/dL - - 39(L) - 37(L)  Calc LDL mg/dL (calc) - - 73 - 79  Triglycerides <150 mg/dL - - 54 - 63  Creatinine 0.50 - 1.10 mg/dL - - 0.83 - 0.99   BP/Weight 01/06/2019 01/05/2019 08/20/2018 06/09/2018 06/03/2018 04/27/2018 1/38/8719  Systolic BP 597 471 855 015 868 257 493  Diastolic BP 64 80 70 80 74 78 82  Wt. (Lbs) 171 170 171.12 178 178 179 187.2  BMI 31.28 31.09 31.3 31.53 31.53 32.74 34.24   Foot/eye exam completion dates Latest Ref Rng & Units 04/27/2018 10/28/2017  Eye Exam No Retinopathy - No Retinopathy  Foot exam Order - - -  Foot Form Completion - Done -    Deteriorated, no access to meds, this has changed in past 1 week, followed by Endo and highly motivated

## 2019-01-10 NOTE — Assessment & Plan Note (Signed)
Deteriorated.  Patient re-educated about  the importance of commitment to a  minimum of 150 minutes of exercise per week as able.  The importance of healthy food choices with portion control discussed, as well as eating regularly and within a 12 hour window most days. The need to choose "clean , green" food 50 to 75% of the time is discussed, as well as to make water the primary drink and set a goal of 64 ounces water daily.  Encouraged to start a food diary,  and to consider  joining a support group. Sample diet sheets offered. Goals set by the patient for the next several months.   Weight /BMI 01/06/2019 01/05/2019 08/20/2018  WEIGHT 171 lb 170 lb 171 lb 1.9 oz  HEIGHT 5\' 2"  5\' 2"  5\' 2"   BMI 31.28 kg/m2 31.09 kg/m2 31.3 kg/m2

## 2019-01-10 NOTE — Assessment & Plan Note (Signed)
nm recent flare

## 2019-01-10 NOTE — Progress Notes (Signed)
[name]     MRN: [id number]      DOB: [birth year]   HPI [name] is here for follow up and re-evaluation of chronic medical conditions, medication management and review of any available recent lab and radiology data.  Preventive health is updated, specifically  Cancer screening and Immunization.   Questions or concerns regarding consultations or procedures which the PT has had in the interim are  addressed. The PT denies any adverse reactions to current medications since the last visit.  There are no new concerns.  There are no specific complaints  Has had elevated blood sugars in past 6 to 8 weeks, because she has been out of her meds, however this is now corrected  ROS Denies recent fever or chills. Denies sinus pressure, nasal congestion, ear pain or sore throat. Denies chest congestion, productive cough or wheezing. Denies chest pains, palpitations and leg swelling Denies abdominal pain, nausea, vomiting,diarrhea or constipation.   Denies dysuria, frequency, hesitancy or incontinence. Denies joint pain, swelling and limitation in mobility. Denies headaches, seizures, numbness, or tingling. Denies depression, anxiety or insomnia. Denies skin break down or rash.   PE  BP 112/64   Pulse 79   Resp 12   Ht 5\' 2"  (1.575 m)   Wt 171 lb (77.6 kg)   SpO2 97%   BMI 31.28 kg/m   Patient alert and oriented and in no cardiopulmonary distress.  HEENT: No facial asymmetry, EOMI,   oropharynx pink and moist.  Neck supple no JVD, no mass.  Chest: Clear to auscultation bilaterally.  CVS: S1, S2 no murmurs, no S3.Regular rate.  ABD: Soft non tender.   Ext: No edema  MS: Adequate ROM spine, shoulders, hips and knees.  Skin: Intact, no ulcerations or rash noted.  Psych: Good eye contact, normal affect. Memory intact not anxious or depressed appearing.  CNS: CN 2-12 intact, power,  normal throughout.no focal deficits noted.   Assessment & Plan  Essential  hypertension Controlled, no change in medication DASH diet and commitment to daily physical activity for a minimum of 30 minutes discussed and encouraged, as a part of hypertension management. The importance of attaining a healthy weight is also discussed.  BP/Weight 01/06/2019 01/05/2019 08/20/2018 06/09/2018 06/03/2018 04/27/2018 2/68/3419  Systolic BP 622 297 989 211 941 740 814  Diastolic BP 64 80 70 80 74 78 82  Wt. (Lbs) 171 170 171.12 178 178 179 187.2  BMI 31.28 31.09 31.3 31.53 31.53 32.74 34.24       Mixed hyperlipidemia Hyperlipidemia:Low fat diet discussed and encouraged.   Lipid Panel  Lab Results  Component Value Date   CHOL 125 01/01/2019   HDL 39 (L) 01/01/2019   LDLCALC 73 01/01/2019   TRIG 54 01/01/2019   CHOLHDL 3.2 01/01/2019   Needs to increase exercise , no med change    Obesity (BMI 30.0-34.9) Deteriorated.  Patient re-educated about  the importance of commitment to a  minimum of 150 minutes of exercise per week as able.  The importance of healthy food choices with portion control discussed, as well as eating regularly and within a 12 hour window most days. The need to choose "clean , green" food 50 to 75% of the time is discussed, as well as to make water the primary drink and set a goal of 64 ounces water daily.  Encouraged to start a food diary,  and to consider  joining a support group. Sample diet sheets offered. Goals set by the patient  for the next several months.   Weight /BMI 01/06/2019 01/05/2019 08/20/2018  WEIGHT 171 lb 170 lb 171 lb 1.9 oz  HEIGHT 5\' 2"  5\' 2"  5\' 2"   BMI 31.28 kg/m2 31.09 kg/m2 31.3 kg/m2      Diabetes mellitus type 2 in obese [facility]) [name] is reminded of the importance of commitment to daily physical activity for 30 minutes or more, as able and the need to limit carbohydrate intake to 30 to 60 grams per meal to help with blood sugar control.   The need to take medication as prescribed, test blood sugar as directed,  and to call between visits if there is a concern that blood sugar is uncontrolled is also discussed.   [name] is reminded of the importance of daily foot exam, annual eye examination, and good blood sugar, blood pressure and cholesterol control.  Diabetic Labs Latest Ref Rng & Units 01/07/2019 01/05/2019 01/01/2019 06/09/2018 04/20/2018  HbA1c 4.0 - 5.6 % - 8.6(A) - 7.7(A) 9.4(H)  Microalbumin Not Estab. ug/mL 19.5(H) - - - -  Micro/Creat Ratio 0 - 29 mg/g creat 13 - - - -  Chol <200 mg/dL - - 125 - 130  HDL > OR = 50 mg/dL - - 39(L) - 37(L)  Calc LDL mg/dL (calc) - - 73 - 79  Triglycerides <150 mg/dL - - 54 - 63  Creatinine 0.50 - 1.10 mg/dL - - 0.83 - 0.99   BP/Weight 01/06/2019 01/05/2019 08/20/2018 06/09/2018 06/03/2018 04/27/2018 7/67/3419  Systolic BP 379 024 097 353 299 242 683  Diastolic BP 64 80 70 80 74 78 82  Wt. (Lbs) 171 170 171.12 178 178 179 187.2  BMI 31.28 31.09 31.3 31.53 31.53 32.74 34.24   Foot/eye exam completion dates Latest Ref Rng & Units 04/27/2018 10/28/2017  Eye Exam No Retinopathy - No Retinopathy  Foot exam Order - - -  Foot Form Completion - Done -    Deteriorated, no access to meds, this has changed in past 1 week, followed by Endo and highly motivated    Back pain with left-sided sciatica nm recent flare

## 2019-01-18 ENCOUNTER — Other Ambulatory Visit: Payer: Self-pay | Admitting: Family Medicine

## 2019-02-24 ENCOUNTER — Other Ambulatory Visit: Payer: Self-pay | Admitting: Internal Medicine

## 2019-03-02 ENCOUNTER — Other Ambulatory Visit: Payer: Self-pay | Admitting: "Endocrinology

## 2019-03-02 ENCOUNTER — Other Ambulatory Visit: Payer: Self-pay | Admitting: Family Medicine

## 2019-03-04 ENCOUNTER — Other Ambulatory Visit: Payer: Self-pay | Admitting: Internal Medicine

## 2019-03-05 DIAGNOSIS — S3992XA Unspecified injury of lower back, initial encounter: Secondary | ICD-10-CM | POA: Diagnosis not present

## 2019-03-05 DIAGNOSIS — M545 Low back pain: Secondary | ICD-10-CM | POA: Diagnosis not present

## 2019-03-17 ENCOUNTER — Telehealth: Payer: Self-pay | Admitting: Orthopedic Surgery
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
## 2019-03-17 NOTE — Telephone Encounter (Signed)
Called back to patient to relay (reached voice mail, left message to return call).

## 2019-03-17 NOTE — Telephone Encounter (Signed)
She should fu with the person named in the er for fu

## 2019-03-30 DIAGNOSIS — H11133 Conjunctival pigmentations, bilateral: Secondary | ICD-10-CM | POA: Diagnosis not present

## 2019-03-30 DIAGNOSIS — H11153 Pinguecula, bilateral: Secondary | ICD-10-CM | POA: Diagnosis not present

## 2019-03-30 DIAGNOSIS — E119 Type 2 diabetes mellitus without complications: Secondary | ICD-10-CM | POA: Diagnosis not present

## 2019-03-30 DIAGNOSIS — H35463 Secondary vitreoretinal degeneration, bilateral: Secondary | ICD-10-CM | POA: Diagnosis not present

## 2019-03-30 DIAGNOSIS — Z794 Long term (current) use of insulin: Secondary | ICD-10-CM | POA: Diagnosis not present

## 2019-04-07 ENCOUNTER — Other Ambulatory Visit: Payer: Self-pay | Admitting: Internal Medicine

## 2019-04-07 ENCOUNTER — Encounter: Payer: Self-pay | Admitting: Internal Medicine

## 2019-04-07 MED ORDER — JANUMET 50-1000 MG PO TABS: 1.0000 | ORAL_TABLET | Freq: Two times a day (BID) | ORAL | 3 refills | Status: DC

## 2019-04-07 NOTE — Progress Notes (Signed)
Janumet

## 2019-04-08 ENCOUNTER — Other Ambulatory Visit: Payer: Self-pay

## 2019-04-08 MED ORDER — HYDROCHLOROTHIAZIDE 25 MG PO TABS: 25.0000 mg | ORAL_TABLET | Freq: Every day | ORAL | 0 refills | Status: DC

## 2019-04-08 MED ORDER — PROPRANOLOL HCL 40 MG PO TABS: ORAL_TABLET | ORAL | 0 refills | Status: DC

## 2019-04-28 ENCOUNTER — Other Ambulatory Visit: Payer: Self-pay | Admitting: Internal Medicine

## 2019-05-03 ENCOUNTER — Other Ambulatory Visit: Payer: Self-pay | Admitting: Internal Medicine

## 2019-05-03 ENCOUNTER — Other Ambulatory Visit: Payer: Self-pay | Admitting: Family Medicine

## 2019-05-03 ENCOUNTER — Ambulatory Visit: Payer: Medicaid Other | Admitting: Internal Medicine

## 2019-05-05 ENCOUNTER — Other Ambulatory Visit: Payer: Self-pay

## 2019-05-05 ENCOUNTER — Ambulatory Visit (HOSPITAL_COMMUNITY)
Admission: RE | Admit: 2019-05-05 | Discharge: 2019-05-05 | Disposition: A | Discharge status: Home / Self Care | Payer: Medicaid Other | Source: Ambulatory Visit | Attending: Family Medicine | Admitting: Family Medicine

## 2019-05-05 ENCOUNTER — Encounter (HOSPITAL_COMMUNITY): Payer: Self-pay

## 2019-05-05 DIAGNOSIS — Z1231 Encounter for screening mammogram for malignant neoplasm of breast: Secondary | ICD-10-CM | POA: Insufficient documentation

## 2019-05-13 ENCOUNTER — Telehealth: Payer: Self-pay

## 2019-05-13 DIAGNOSIS — E1169 Type 2 diabetes mellitus with other specified complication: Secondary | ICD-10-CM

## 2019-05-13 DIAGNOSIS — E669 Obesity, unspecified: Secondary | ICD-10-CM

## 2019-05-13 MED ORDER — BLOOD GLUCOSE METER KIT: PACK | 0 refills | Status: DC

## 2019-05-13 NOTE — Telephone Encounter (Signed)
Glucometer kit ordered and faxed

## 2019-05-28 ENCOUNTER — Other Ambulatory Visit: Payer: Self-pay | Admitting: Internal Medicine

## 2019-06-02 ENCOUNTER — Other Ambulatory Visit: Payer: Self-pay | Admitting: Family Medicine

## 2019-06-03 ENCOUNTER — Other Ambulatory Visit: Payer: Self-pay | Admitting: Family Medicine

## 2019-06-03 DIAGNOSIS — E1169 Type 2 diabetes mellitus with other specified complication: Secondary | ICD-10-CM

## 2019-06-28 ENCOUNTER — Encounter: Payer: Self-pay | Admitting: Family Medicine

## 2019-06-28 ENCOUNTER — Telehealth: Payer: Self-pay

## 2019-06-28 DIAGNOSIS — I10 Essential (primary) hypertension: Secondary | ICD-10-CM

## 2019-06-28 DIAGNOSIS — E782 Mixed hyperlipidemia: Secondary | ICD-10-CM

## 2019-06-28 NOTE — Telephone Encounter (Signed)
Expired labs reordered to be drawn prior to appt with MD

## 2019-07-02 ENCOUNTER — Telehealth: Payer: Self-pay

## 2019-07-02 DIAGNOSIS — I10 Essential (primary) hypertension: Secondary | ICD-10-CM

## 2019-07-02 DIAGNOSIS — E782 Mixed hyperlipidemia: Secondary | ICD-10-CM | POA: Diagnosis not present

## 2019-07-02 LAB — LIPID PANEL
Cholesterol: 157 mg/dL (ref <200)
HDL: 40 mg/dL — ABNORMAL LOW (ref >=50)
LDL Cholesterol (Calc): 99 mg/dL (calc)
Non-HDL Cholesterol (Calc): 117 mg/dL (calc) (ref <130)
Total CHOL/HDL Ratio: 3.9 (calc) (ref <5.0)
Triglycerides: 85 mg/dL (ref <150)

## 2019-07-02 LAB — COMPLETE METABOLIC PANEL WITH GFR
AG Ratio: 1.6 (calc) (ref 1.0–2.5)
ALT: 18 U/L (ref 6–29)
AST: 16 U/L (ref 10–30)
Albumin: 4.6 g/dL (ref 3.6–5.1)
Alkaline phosphatase (APISO): 76 U/L (ref 31–125)
BUN: 16 mg/dL (ref 7–25)
CO2: 28 mmol/L (ref 20–32)
Calcium: 9.8 mg/dL (ref 8.6–10.2)
Chloride: 97 mmol/L — ABNORMAL LOW (ref 98–110)
Creat: 0.92 mg/dL (ref 0.50–1.10)
GFR, Est African American: 89 mL/min/{1.73_m2} (ref >=60)
GFR, Est Non African American: 77 mL/min/{1.73_m2} (ref >=60)
Globulin: 2.9 g/dL (calc) (ref 1.9–3.7)
Glucose, Bld: 186 mg/dL — ABNORMAL HIGH (ref 65–99)
Potassium: 3.8 mmol/L (ref 3.5–5.3)
Sodium: 136 mmol/L (ref 135–146)
Total Bilirubin: 0.5 mg/dL (ref 0.2–1.2)
Total Protein: 7.5 g/dL (ref 6.1–8.1)

## 2019-07-02 NOTE — Telephone Encounter (Signed)
Labs reordered. Originally under wrong facility

## 2019-07-12 ENCOUNTER — Telehealth (INDEPENDENT_AMBULATORY_CARE_PROVIDER_SITE_OTHER): Payer: Medicaid Other | Admitting: Family Medicine

## 2019-07-12 ENCOUNTER — Encounter: Payer: Self-pay | Admitting: Family Medicine

## 2019-07-12 ENCOUNTER — Other Ambulatory Visit: Payer: Self-pay

## 2019-07-12 VITALS — BP 112/64 | HR 79 | Resp 15 | Ht 62.0 in | Wt 171.0 lb

## 2019-07-12 DIAGNOSIS — E669 Obesity, unspecified: Secondary | ICD-10-CM | POA: Diagnosis not present

## 2019-07-12 DIAGNOSIS — I10 Essential (primary) hypertension: Secondary | ICD-10-CM

## 2019-07-12 DIAGNOSIS — E1159 Type 2 diabetes mellitus with other circulatory complications: Secondary | ICD-10-CM | POA: Diagnosis not present

## 2019-07-12 DIAGNOSIS — E1169 Type 2 diabetes mellitus with other specified complication: Secondary | ICD-10-CM | POA: Diagnosis not present

## 2019-07-12 DIAGNOSIS — E559 Vitamin D deficiency, unspecified: Secondary | ICD-10-CM

## 2019-07-12 DIAGNOSIS — E782 Mixed hyperlipidemia: Secondary | ICD-10-CM

## 2019-07-12 DIAGNOSIS — E66811 Obesity, class 1: Secondary | ICD-10-CM

## 2019-07-12 NOTE — Progress Notes (Signed)
Virtual Visit via Telephone Note  I connected with [name] on 07/12/19 at  1:00 PM EST by telephone and verified that I am speaking with the correct person using two identifiers.  Location: Patient: home Provider: office  I discussed the limitations, risks, security and privacy concerns of performing an evaluation and management service by telephone and the availability of in person appointments. I also discussed with the patient that there may be a patient responsible charge related to this service. The patient expressed understanding and agreed to proceed.   History of Present Illness: F/U chronic problems and lab review C/o uncontrolled blood sugars states oin past 1 week blood sugars have risen to average 200, needs appt with Endo, will call Denies recent fever or chills. Denies sinus pressure, nasal congestion, ear pain or sore throat. Denies chest congestion, productive cough or wheezing. Denies chest pains, palpitations and leg swelling Denies abdominal pain, nausea, vomiting,diarrhea or constipation.   Denies dysuria, frequency, hesitancy or incontinence. Denies joint pain, swelling and limitation in mobility. Denies headaches, seizures, numbness, or tingling. Denies depression, anxiety or insomnia. Denies skin break down or rash. Not able to exercise as much as before due to Covid and change in weather, will work on doing in home exercise       Observations/Objective:  BP 112/64   Pulse 79   Resp 15   Ht 5\' 2"  (1.575 m)   Wt 171 lb (77.6 kg)   BMI 31.28 kg/m  Good communication with no confusion and intact memory. Alert and oriented x 3 No signs of respiratory distress during speech   Assessment and Plan:  Essential hypertension Controlled, no change in medication DASH diet and commitment to daily physical activity for a minimum of 30 minutes discussed and encouraged, as a part of hypertension management. The importance of attaining a healthy  weight is also discussed.  BP/Weight 07/12/2019 01/06/2019 01/05/2019 08/20/2018 06/09/2018 06/03/2018 [id number]  Systolic BP XX123456 [id number] 123456 [id number] [id number]  Diastolic BP 64 64 80 70 80 74 78  Wt. (Lbs) 171 171 170 171.12 178 178 179  BMI 31.28 31.28 31.09 31.3 31.53 31.53 32.74       Type 2 diabetes mellitus with vascular disease ([place]) Still uncontrolled though improved [name] is reminded of the importance of commitment to daily physical activity for 30 minutes or more, as able and the need to limit carbohydrate intake to 30 to 60 grams per meal to help with blood sugar control.   The need to take medication as prescribed, test blood sugar as directed, and to call between visits if there is a concern that blood sugar is uncontrolled is also discussed.   [name] is reminded of the importance of daily foot exam, annual eye examination, and good blood sugar, blood pressure and cholesterol control.  Diabetic Labs Latest Ref Rng & Units 07/14/2019 07/02/2019 01/07/2019 01/05/2019 01/01/2019  HbA1c <5.7 % of total Hgb 8.2(H) - - 8.6(A) -  Microalbumin Not Estab. ug/mL - - 19.5(H) - -  Micro/Creat Ratio 0 - 29 mg/g creat - - 13 - -  Chol <200 mg/dL - 157 - - 125  HDL > OR = 50 mg/dL - 40(L) - - 39(L)  Calc LDL mg/dL (calc) - 99 - - 73  Triglycerides <150 mg/dL - 85 - - 54  Creatinine 0.50 - 1.10 mg/dL - 0.92 - - 0.83   BP/Weight 07/12/2019 01/06/2019 01/05/2019 08/20/2018 06/09/2018 06/03/2018 [id number]  Systolic BP XX123456 XX123456 123456  106 0000000 [id number] 123456  Diastolic BP 64 64 80 70 80 74 78  Wt. (Lbs) 171 171 170 171.12 178 178 179  BMI 31.28 31.28 31.09 31.3 31.53 31.53 32.74   Foot/eye exam completion dates Latest Ref Rng & Units 04/27/2018 10/28/2017  Eye Exam No Retinopathy - No Retinopathy  Foot exam Order - - -  Foot Form Completion - Done -  will call for  f/u with Endo, will enter referral    Obesity (BMI 30.0-34.9)  Patient re-educated about  the importance of commitment to a  minimum of  150 minutes of exercise per week as able.  The importance of healthy food choices with portion control discussed, as well as eating regularly and within a 12 hour window most days. The need to choose "clean , green" food 50 to 75% of the time is discussed, as well as to make water the primary drink and set a goal of 64 ounces water daily.    Weight /BMI 07/12/2019 01/06/2019 01/05/2019  WEIGHT 171 lb 171 lb 170 lb  HEIGHT 5\' 2"  5\' 2"  5\' 2"   BMI 31.28 kg/m2 31.28 kg/m2 31.09 kg/m2      Mixed hyperlipidemia Hyperlipidemia:Low fat diet discussed and encouraged.   Lipid Panel  Lab Results  Component Value Date   CHOL 157 07/02/2019   HDL 40 (L) 07/02/2019   LDLCALC 99 07/02/2019   TRIG 85 07/02/2019   CHOLHDL 3.9 07/02/2019   Needs to increase exercise to improve HDL     Follow Up Instructions:    I discussed the assessment and treatment plan with the patient. The patient was provided an opportunity to ask questions and all were answered. The patient agreed with the plan and demonstrated an understanding of the instructions.   The patient was advised to call back or seek an in-person evaluation if the symptoms worsen or if the condition fails to improve as anticipated.  I provided 25 minutes of non-face-to-face time during this encounter.   Maria Nakayama, MD

## 2019-07-12 NOTE — Patient Instructions (Addendum)
F/u in office with MD in 6 months, call if you  need me before, foot exam at visit  Flu vaccine Dec  2 at 8: 30 am , nurse visit.  Please get HBA1C, and vit D at [facility] this  Wednesday  Cholesterol , kidney and liver function tests are good  Please increase time spent in exercise to improve your " good cholesterol"   We will send for eye exam with Dr Radford Pax in [place] done in March this year  Pls call to schedule appt with Endo

## 2019-07-14 ENCOUNTER — Ambulatory Visit (INDEPENDENT_AMBULATORY_CARE_PROVIDER_SITE_OTHER): Payer: Medicaid Other

## 2019-07-14 ENCOUNTER — Other Ambulatory Visit: Payer: Self-pay

## 2019-07-14 DIAGNOSIS — Z23 Encounter for immunization: Secondary | ICD-10-CM | POA: Diagnosis not present

## 2019-07-14 DIAGNOSIS — E669 Obesity, unspecified: Secondary | ICD-10-CM | POA: Diagnosis not present

## 2019-07-14 DIAGNOSIS — E559 Vitamin D deficiency, unspecified: Secondary | ICD-10-CM | POA: Diagnosis not present

## 2019-07-14 DIAGNOSIS — E1169 Type 2 diabetes mellitus with other specified complication: Secondary | ICD-10-CM | POA: Diagnosis not present

## 2019-07-15 LAB — HEMOGLOBIN A1C
Hgb A1c MFr Bld: 8.2 % of total Hgb — ABNORMAL HIGH (ref <5.7)
Mean Plasma Glucose: 189 (calc)
eAG (mmol/L): 10.4 (calc)

## 2019-07-15 LAB — VITAMIN D 25 HYDROXY (VIT D DEFICIENCY, FRACTURES): Vit D, 25-Hydroxy: 30 ng/mL (ref 30–100)

## 2019-07-16 ENCOUNTER — Encounter: Payer: Self-pay | Admitting: Family Medicine

## 2019-07-17 ENCOUNTER — Encounter: Payer: Self-pay | Admitting: Family Medicine

## 2019-07-17 ENCOUNTER — Other Ambulatory Visit: Payer: Self-pay | Admitting: Family Medicine

## 2019-07-17 NOTE — Assessment & Plan Note (Signed)
Controlled, no change in medication DASH diet and commitment to daily physical activity for a minimum of 30 minutes discussed and encouraged, as a part of hypertension management. The importance of attaining a healthy weight is also discussed.  BP/Weight 07/12/2019 01/06/2019 01/05/2019 08/20/2018 06/09/2018 06/03/2018 [id number]  Systolic BP XX123456 XX123456 123456 [id number] 0000000 [id number] 123456  Diastolic BP 64 64 80 70 80 74 78  Wt. (Lbs) 171 171 170 171.12 178 178 179  BMI 31.28 31.28 31.09 31.3 31.53 31.53 32.74

## 2019-07-17 NOTE — Assessment & Plan Note (Signed)
  Patient re-educated about  the importance of commitment to a  minimum of 150 minutes of exercise per week as able.  The importance of healthy food choices with portion control discussed, as well as eating regularly and within a 12 hour window most days. The need to choose "clean , green" food 50 to 75% of the time is discussed, as well as to make water the primary drink and set a goal of 64 ounces water daily.    Weight /BMI 07/12/2019 01/06/2019 01/05/2019  WEIGHT 171 lb 171 lb 170 lb  HEIGHT 5\' 2"  5\' 2"  5\' 2"   BMI 31.28 kg/m2 31.28 kg/m2 31.09 kg/m2

## 2019-07-17 NOTE — Assessment & Plan Note (Signed)
Hyperlipidemia:Low fat diet discussed and encouraged.   Lipid Panel  Lab Results  Component Value Date   CHOL 157 07/02/2019   HDL 40 (L) 07/02/2019   LDLCALC 99 07/02/2019   TRIG 85 07/02/2019   CHOLHDL 3.9 07/02/2019   Needs to increase exercise to improve HDL

## 2019-07-17 NOTE — Assessment & Plan Note (Signed)
Still uncontrolled though improved [name] is reminded of the importance of commitment to daily physical activity for 30 minutes or more, as able and the need to limit carbohydrate intake to 30 to 60 grams per meal to help with blood sugar control.   The need to take medication as prescribed, test blood sugar as directed, and to call between visits if there is a concern that blood sugar is uncontrolled is also discussed.   [name] is reminded of the importance of daily foot exam, annual eye examination, and good blood sugar, blood pressure and cholesterol control.  Diabetic Labs Latest Ref Rng & Units 07/14/2019 07/02/2019 01/07/2019 01/05/2019 01/01/2019  HbA1c <5.7 % of total Hgb 8.2(H) - - 8.6(A) -  Microalbumin Not Estab. ug/mL - - 19.5(H) - -  Micro/Creat Ratio 0 - 29 mg/g creat - - 13 - -  Chol <200 mg/dL - 157 - - 125  HDL > OR = 50 mg/dL - 40(L) - - 39(L)  Calc LDL mg/dL (calc) - 99 - - 73  Triglycerides <150 mg/dL - 85 - - 54  Creatinine 0.50 - 1.10 mg/dL - 0.92 - - 0.83   BP/Weight 07/12/2019 01/06/2019 01/05/2019 08/20/2018 06/09/2018 06/03/2018 [id number]  Systolic BP [id number] 123456 [id number] 0000000 [id number] 123456  Diastolic BP 64 64 80 70 80 74 78  Wt. (Lbs) 171 171 170 171.12 178 178 179  BMI 31.28 31.28 31.09 31.3 31.53 31.53 32.74   Foot/eye exam completion dates Latest Ref Rng & Units 04/27/2018 10/28/2017  Eye Exam No Retinopathy - No Retinopathy  Foot exam Order - - -  Foot Form Completion - Done -  will call for  f/u with Endo, will enter referral

## 2019-08-02 ENCOUNTER — Other Ambulatory Visit: Payer: Self-pay | Admitting: Family Medicine

## 2019-08-04 ENCOUNTER — Other Ambulatory Visit: Payer: Medicaid Other

## 2019-08-10 ENCOUNTER — Ambulatory Visit: Payer: Medicaid Other | Admitting: Internal Medicine

## 2019-08-28 ENCOUNTER — Other Ambulatory Visit: Payer: Self-pay | Admitting: Internal Medicine

## 2019-09-02 ENCOUNTER — Other Ambulatory Visit: Payer: Self-pay | Admitting: Family Medicine

## 2019-09-08 ENCOUNTER — Other Ambulatory Visit: Payer: Self-pay

## 2019-09-09 ENCOUNTER — Ambulatory Visit: Payer: Medicaid Other | Admitting: Internal Medicine

## 2019-09-09 ENCOUNTER — Encounter: Payer: Self-pay | Admitting: Internal Medicine

## 2019-09-09 VITALS — BP 120/70 | HR 97 | Ht 62.0 in | Wt 173.0 lb

## 2019-09-09 DIAGNOSIS — Z794 Long term (current) use of insulin: Secondary | ICD-10-CM

## 2019-09-09 DIAGNOSIS — E669 Obesity, unspecified: Secondary | ICD-10-CM

## 2019-09-09 DIAGNOSIS — E1165 Type 2 diabetes mellitus with hyperglycemia: Secondary | ICD-10-CM | POA: Diagnosis not present

## 2019-09-09 DIAGNOSIS — E782 Mixed hyperlipidemia: Secondary | ICD-10-CM

## 2019-09-09 MED ORDER — FARXIGA 10 MG PO TABS: 10.0000 mg | ORAL_TABLET | Freq: Every day | ORAL | 3 refills | Status: DC

## 2019-09-09 MED ORDER — GLIPIZIDE 5 MG PO TABS: 5.0000 mg | ORAL_TABLET | Freq: Two times a day (BID) | ORAL | 3 refills | Status: DC

## 2019-09-09 NOTE — Patient Instructions (Addendum)
Please continue: - Lantus 60 units at bedtime - Ozempic 1 mg weekly - JanuMet 50-1000 2x a day  Please add: - Glipizide 5 mg 2x before meals  Please increase: - Farxiga 10 mg before b'fast  Please return in 3-4 months with your sugar log.

## 2019-09-09 NOTE — Progress Notes (Signed)
Patient ID: [name], female   DOB: [birth year], 44 y.o.   MRN: [id number]   This visit occurred during the SARS-CoV-2 public health emergency.  Safety protocols were in place, including screening questions prior to the visit, additional usage of staff PPE, and extensive cleaning of exam room while observing appropriate contact time as indicated for disinfecting solutions.   HPI: [name] is a 44 y.o.-year-old female, returning for f/u for DM2, dx in 2005, insulin-dependent since 2018, uncontrolled, without long-term complications.  She previously saw Dr. Dorris Fetch. First visit with me 02/2018.  Last visit 8 months ago.  Reviewed HbA1c levels: Lab Results  Component Value Date   HGBA1C 8.2 (H) 07/14/2019   HGBA1C 8.6 (A) 01/05/2019   HGBA1C 7.7 (A) 06/09/2018   Pt was on: - JanuMet 50-1000 mg 2x a day, with meals - Lantus 40 >> 60 units at bedtime - re-added 11/2017  She stopped Glipizide in 11/2017. She stopped [place] b/c increased urination. She stopped Invokana b/c yeast inf's.  She is currently on:  - Farxiga 5 mg daily in am - started 12/2018 - JanuMet 50-1000 x 2 a day - restarted 03/2019, as otherwise she could not tolerate Metformin - Ozempic - started 02/2018 >>  1 mg weekly - Lantus 60 units at bedtime Also, she was on Metformin ER 1000 mg twice a day with meals - started 05/2018 >> GI intolerance  Pt checks her sugars once a day: - am: 190-230 >> 100-161, 180, 249 >> 111-195 >> 124, 144-202 - 2h after b'fast: n/c >> 150-210 >> n/c >> 170-215 - before lunch: n/c >> 118, 139 >> n/c - 2h after lunch: n/c >> 174-200 >> n/c - before dinner: n/c >> 102-130, 151 >> n/c - 2h after dinner: n/c >> 150 >> 165 >> n/c - bedtime: 170-190 >> 170 >> n/c - nighttime: n/c Lowest sugar was 69 - pm >> 100 >> 111 >> 124; she has hypoglycemia awareness in the 70s. Highest sugar was 249 >> 365 >> 225.  Continues to walk for exercise.  Glucometer: Truemetrix   Pt's  meals are: - Breakfast: cereal + milk, muffin + fruit - Lunch: salad, sandwich, hamburger - Dinner: meat + veggies  - Snacks: fruit, crackers  -No CKD, last BUN/creatinine:  Lab Results  Component Value Date   BUN 16 07/02/2019   BUN 9 01/01/2019   CREATININE 0.92 07/02/2019   CREATININE 0.83 01/01/2019  She is allergic to ACE inhibitors  + Dyslipidemia; last set of lipids: Lab Results  Component Value Date   CHOL 157 07/02/2019   HDL 40 (L) 07/02/2019   LDLCALC 99 07/02/2019   TRIG 85 07/02/2019   CHOLHDL 3.9 07/02/2019  On pravastatin 10  - last eye exam was in 01/2019: No DR  - NO numbness and tingling in her feet.  Pt has FH of DM in M.  ROS: Constitutional: no weight gain/no weight loss, no fatigue, no subjective hyperthermia, no subjective hypothermia Eyes: no blurry vision, no xerophthalmia ENT: no sore throat, no nodules palpated in neck, no dysphagia, no odynophagia, no hoarseness Cardiovascular: no CP/no SOB/no palpitations/no leg swelling Respiratory: no cough/no SOB/no wheezing Gastrointestinal: no N/no V/no D/no C/no acid reflux Musculoskeletal: no muscle aches/no joint aches Skin: no rashes, no hair loss Neurological: no tremors/no numbness/no tingling/no dizziness  I reviewed pt's medications, allergies, PMH, social hx, family hx, and changes were documented in the history of present illness. Otherwise, unchanged from my initial visit note.  Past Medical History:  Diagnosis Date  . Constipation   . GERD (gastroesophageal reflux disease)   . Hypertension   . Morbid obesity ([place])   . NIDDM (non-insulin dependent diabetes mellitus) 2005   Type II  . OSA (obstructive sleep apnea) 06/24/2018   Past Surgical History:  Procedure Laterality Date  . BACK SURGERY  05/25/2010   Dr,Kabell   . CESAREAN SECTION  6/09, 2015  . LUMBAR LAMINECTOMY/DECOMPRESSION MICRODISCECTOMY Left 09/13/2015   Procedure: Left Lumbar five-Sacral one microdiskectomy;  Surgeon:  Ashok Pall, MD;  Location: [place];  Service: Neurosurgery;  Laterality: Left;  Left Lumbar five-Sacral one microdiskectomy   Social History   Socioeconomic History  . Marital status: Married    Spouse name: Not on file  . Number of children: 2  . Years of education: Not on file  . Highest education level: Not on file  Occupational History  . Occupation: unemployed   Tobacco Use  . Smoking status: Never Smoker  . Smokeless tobacco: Never Used  Substance and Sexual Activity  . Alcohol use: No  . Drug use: No   Current Outpatient Medications on File Prior to Visit  Medication Sig Dispense Refill  . Accu-Chek FastClix Lancets MISC TO check blood glucose TWICE DAILY 102 each 6  . ACCU-CHEK GUIDE test strip USE TO TEST SUGAR THREE TIMES DAILY 100 strip 0  . Blood Glucose Monitoring Suppl (ACCU-CHEK GUIDE ME) w/Device KIT USE AS DIRECTED 1 kit 0  . dapagliflozin propanediol (FARXIGA) 5 MG TABS tablet Take 5 mg by mouth daily. 30 tablet 11  . GLOBAL EASE INJECT PEN NEEDLES 31G X 8 MM MISC USE WITH LANTUS 100 each 3  . hydrochlorothiazide (HYDRODIURIL) 25 MG tablet TAKE 1 TABLET BY MOUTH EVERY DAY 30 tablet 2  . ibuprofen (ADVIL,MOTRIN) 800 MG tablet Take 1 tablet by mouth 3 (three) times daily as needed.    Marland Kitchen JANUMET 50-1000 MG tablet TAKE 1 TABLET BY MOUTH TWICE DAILY WITH A MEAL 60 tablet 3  . LANTUS SOLOSTAR 100 UNIT/ML Solostar Pen INJECT 60 UNITS INTO THE SKIN DAILY AT 10 IN THE EVENING 15 mL 2  . OZEMPIC, 1 MG/DOSE, 2 MG/1.5ML SOPN INJECT ONE MG INTO THE SKIN ONCE A WEEK 6 mL 3  . pravastatin (PRAVACHOL) 10 MG tablet TAKE 1 TABLET BY MOUTH AT BEDTIME. 90 tablet 2  . propranolol (INDERAL) 40 MG tablet TAKE 1 TABLET BY MOUTH TWICE DAILY 60 tablet 2   No current facility-administered medications on file prior to visit.   Allergies  Allergen Reactions  . Ace Inhibitors Cough  . Amlodipine Besylate     REACTION: severe edema of legs   Family History  Problem Relation Age  of Onset  . Diabetes Father   . Hypertension Father   . Kidney disease Father   . Kidney failure Father   . Diabetes Mother   . Hypertension Mother     PE: BP 120/70   Pulse 97   Ht '5\' 2"'  (1.575 m)   Wt 173 lb (78.5 kg)   SpO2 98%   BMI 31.64 kg/m  Wt Readings from Last 3 Encounters:  09/09/19 173 lb (78.5 kg)  07/12/19 171 lb (77.6 kg)  01/06/19 171 lb (77.6 kg)   Constitutional: overweight, in NAD Eyes: PERRLA, EOMI, no exophthalmos ENT: moist mucous membranes, no thyromegaly, no cervical lymphadenopathy Cardiovascular: tachycardia, RR, No MRG Respiratory: CTA B Gastrointestinal: abdomen soft, NT, ND, BS+ Musculoskeletal: no deformities, strength intact in all  4 Skin: moist, warm, no rashes except acanthosis nigricans on neck Neurological: no tremor with outstretched hands, DTR normal in all 4  ASSESSMENT: 1. DM2, insulin-dependent, uncontrolled, without long-term complications, but with hyperglycemia  2. HL  3. Obesity class I  PLAN:  1. Patient with longstanding, uncontrolled, type 2 diabetes, on oral antidiabetic regimen with SGLT2 inhibitor, also weekly GLP-1 receptor agonist and long-acting insulin.  She did much better after starting Ozempic and especially after increasing the dose to 1 mg weekly.  She tolerates the medication well.  However, at last visit 1 month she was telling me she could not tolerate Metformin and we stopped this.  Later, in 03/2019, we added Janumet since she could not tolerate metformin in other forms.  We also added an SGLT2 inhibitor since her sugars were high and to help with the cardiovascular and kidney also comes in also her weight. -She had a recent HbA1c obtained last which was slightly better, at 8.2% -At this visit, sugars are still high and occasionally in the 200s both before and after breakfast.  She is not checking it at later times of the day.  I strongly advised her to check some sugars around dinnertime, both before and after.   No lows.  We discussed about increasing Farxiga to target dose of 10 mg in the morning, since she is tolerating the 5 mg dose well.  However, I do not feel that this would be enough and we discussed about also adding glipizide before breakfast and dinner.  This is likely not a permanent measure, will reevaluate at next visit and stop if sugars improved. - I suggested to:  Patient Instructions  Please continue: - Lantus 60 units at bedtime - Ozempic 1 mg weekly - JanuMet 50-1000 2x a day  Please add: - Glipizide 5 mg 2x before meals  Please increase: - Farxiga 10 mg before b'fast  Please return in 3-4 months with your sugar log.   - advised to check sugars at different times of the day - 1-2x a day, rotating check times - advised for yearly eye exams >> she is UTD - return to clinic in 3-4 months  2. HL -Reviewed latest lipid panel from 06/2019: LDL at goal, HDL low Lab Results  Component Value Date   CHOL 157 07/02/2019   HDL 40 (L) 07/02/2019   LDLCALC 99 07/02/2019   TRIG 85 07/02/2019   CHOLHDL 3.9 07/02/2019  -Continues pravastatin without side effects  3. Obesity -She lost 17 pounds after starting Ozempic, before last visit -Continue Ozempic and Farxiga (which we will increase today), which should both help with weight loss -Weight stable since last visit  Philemon Kingdom, MD PhD [facility] Endocrinology

## 2019-10-03 ENCOUNTER — Other Ambulatory Visit: Payer: Self-pay | Admitting: Family Medicine

## 2019-10-28 ENCOUNTER — Encounter: Payer: Self-pay | Admitting: Family Medicine

## 2019-10-28 ENCOUNTER — Other Ambulatory Visit: Payer: Self-pay

## 2019-10-28 ENCOUNTER — Ambulatory Visit (INDEPENDENT_AMBULATORY_CARE_PROVIDER_SITE_OTHER): Payer: Medicaid Other | Admitting: Family Medicine

## 2019-10-28 VITALS — BP 117/74 | HR 98 | Temp 97.0°F | Ht 62.0 in | Wt 177.2 lb

## 2019-10-28 DIAGNOSIS — M25561 Pain in right knee: Secondary | ICD-10-CM | POA: Diagnosis not present

## 2019-10-28 MED ORDER — MELOXICAM 7.5 MG PO TABS: 7.5000 mg | ORAL_TABLET | Freq: Every day | ORAL | 0 refills | Status: DC

## 2019-10-28 NOTE — Progress Notes (Signed)
Acute Office Visit  Subjective:    Patient ID: [name], female    DOB: October 24, 1975, 44 y.o.   MRN: [id number]  Chief Complaint  Patient presents with  . Knee Pain     right knee pain for the past month.    HPI Patient is in today for right knee pain-intermittently.pt states standing with pain-worse with rain, cold. No instability.  No recent  injury.  No sports injury.  Pt states onset over the last few weeks.  Worse pain going upstairs. No change in skin color. No swelling  Past Medical History:  Diagnosis Date  . Constipation   . GERD (gastroesophageal reflux disease)   . Hypertension   . Morbid obesity ([place])   . NIDDM (non-insulin dependent diabetes mellitus) 2005   Type II  . OSA (obstructive sleep apnea) 06/24/2018    Past Surgical History:  Procedure Laterality Date  . BACK SURGERY  05/25/2010   Dr,Kabell   . CESAREAN SECTION  6/09, 2015  . LUMBAR LAMINECTOMY/DECOMPRESSION MICRODISCECTOMY Left 09/13/2015   Procedure: Left Lumbar five-Sacral one microdiskectomy;  Surgeon: Ashok Pall, MD;  Location: [place] [facility];  Service: Neurosurgery;  Laterality: Left;  Left Lumbar five-Sacral one microdiskectomy    Family History  Problem Relation Age of Onset  . Diabetes Father   . Hypertension Father   . Kidney disease Father   . Kidney failure Father   . Diabetes Mother   . Hypertension Mother     Social History   Socioeconomic History  . Marital status: Married    Spouse name: Not on file  . Number of children: Not on file  . Years of education: Not on file  . Highest education level: Not on file  Occupational History  . Occupation: unemployed   Tobacco Use  . Smoking status: Never Smoker  . Smokeless tobacco: Never Used  Substance and Sexual Activity  . Alcohol use: No  . Drug use: No  . Sexual activity: Yes    Birth control/protection: None  Other Topics Concern  . Not on file  Social History Narrative  . Not on file   Social Determinants  of Health   Financial Resource Strain:   . Difficulty of Paying Living Expenses:   Food Insecurity:   . Worried About Charity fundraiser in the Last Year:   . Arboriculturist in the Last Year:   Transportation Needs:   . Film/video editor (Medical):   Marland Kitchen Lack of Transportation (Non-Medical):   Physical Activity:   . Days of Exercise per Week:   . Minutes of Exercise per Session:   Stress:   . Feeling of Stress :   Social Connections:   . Frequency of Communication with Friends and Family:   . Frequency of Social Gatherings with Friends and Family:   . Attends Religious Services:   . Active Member of Clubs or Organizations:   . Attends Archivist Meetings:   Marland Kitchen Marital Status:   Intimate Partner Violence:   . Fear of Current or Ex-Partner:   . Emotionally Abused:   Marland Kitchen Physically Abused:   . Sexually Abused:     Outpatient Medications Prior to Visit  Medication Sig Dispense Refill  . Accu-Chek FastClix Lancets MISC TO check blood glucose TWICE DAILY 102 each 6  . ACCU-CHEK GUIDE test strip USE TO TEST SUGAR THREE TIMES DAILY 100 strip 0  . Blood Glucose Monitoring Suppl (ACCU-CHEK GUIDE ME) w/Device KIT USE  AS DIRECTED 1 kit 0  . dapagliflozin propanediol (FARXIGA) 10 MG TABS tablet Take 10 mg by mouth daily before breakfast. 90 tablet 3  . glipiZIDE (GLUCOTROL) 5 MG tablet Take 1 tablet (5 mg total) by mouth 2 (two) times daily before a meal. 180 tablet 3  . GLOBAL EASE INJECT PEN NEEDLES 31G X 8 MM MISC USE WITH LANTUS 100 each 3  . hydrochlorothiazide (HYDRODIURIL) 25 MG tablet TAKE 1 TABLET BY MOUTH EVERY DAY 30 tablet 2  . ibuprofen (ADVIL,MOTRIN) 800 MG tablet Take 1 tablet by mouth 3 (three) times daily as needed.    Marland Kitchen JANUMET 50-1000 MG tablet TAKE 1 TABLET BY MOUTH TWICE DAILY WITH A MEAL 60 tablet 3  . LANTUS SOLOSTAR 100 UNIT/ML Solostar Pen INJECT 60 UNITS INTO THE SKIN DAILY AT 10 IN THE EVENING 15 mL 2  . OZEMPIC, 1 MG/DOSE, 2 MG/1.5ML SOPN INJECT ONE  MG INTO THE SKIN ONCE A WEEK 6 mL 3  . pravastatin (PRAVACHOL) 10 MG tablet TAKE 1 TABLET BY MOUTH AT BEDTIME. 90 tablet 1  . propranolol (INDERAL) 40 MG tablet TAKE 1 TABLET BY MOUTH TWICE DAILY 60 tablet 2   No facility-administered medications prior to visit.    Allergies  Allergen Reactions  . Ace Inhibitors Cough  . Amlodipine Besylate     REACTION: severe edema of legs    Review of Systems  Musculoskeletal: Positive for arthralgias and gait problem.       Objective:    Physical Exam Constitutional:      Appearance: Normal appearance.  Musculoskeletal:        General: Tenderness present. No swelling or deformity. Normal range of motion.     Right lower leg: No edema.     Left lower leg: No edema.       Legs:  Neurological:     Mental Status: She is alert.     BP 117/74 (BP Location: Right Arm, Patient Position: Sitting, Cuff Size: Large)   Pulse 98   Temp (!) 97 F (36.1 C) (Temporal)   Ht '5\' 2"'  (1.575 m)   Wt 177 lb 3.2 oz (80.4 kg)   SpO2 98%   BMI 32.41 kg/m  Wt Readings from Last 3 Encounters:  10/28/19 177 lb 3.2 oz (80.4 kg)  09/09/19 173 lb (78.5 kg)  07/12/19 171 lb (77.6 kg)    Health Maintenance Due  Topic Date Due  . OPHTHALMOLOGY EXAM  10/29/2018  . PAP SMEAR-Modifier  10/27/2019      Lab Results  Component Value Date   TSH 0.62 01/01/2019   Lab Results  Component Value Date   WBC 8.2 01/01/2019   HGB 11.8 01/01/2019   HCT 37.4 01/01/2019   MCV 80.4 01/01/2019   PLT 201 01/01/2019   Lab Results  Component Value Date   NA 136 07/02/2019   K 3.8 07/02/2019   CO2 28 07/02/2019   GLUCOSE 186 (H) 07/02/2019   BUN 16 07/02/2019   CREATININE 0.92 07/02/2019   BILITOT 0.5 07/02/2019   ALKPHOS 69 01/14/2017   AST 16 07/02/2019   ALT 18 07/02/2019   PROT 7.5 07/02/2019   ALBUMIN 4.0 01/14/2017   CALCIUM 9.8 07/02/2019   ANIONGAP 10 09/13/2015   Lab Results  Component Value Date   CHOL 157 07/02/2019   Lab Results    Component Value Date   HDL 40 (L) 07/02/2019   Lab Results  Component Value Date   LDLCALC 99 07/02/2019  Lab Results  Component Value Date   TRIG 85 07/02/2019   Lab Results  Component Value Date   CHOLHDL 3.9 07/02/2019   Lab Results  Component Value Date   HGBA1C 8.2 (H) 07/14/2019       Assessment & Plan:   1. Acute pain of right knee mobic with food-rx Voltaren gel-otc Ice, elevation - DG Knee Complete 4 Views Left; Future Marilynn Ekstein Hannah Beat, MD

## 2019-10-28 NOTE — Patient Instructions (Addendum)
Voltaren gel-trial mobic -take daily with food-pick up at pharmacy Ice, elevation after walking Right knee xray

## 2019-11-01 DIAGNOSIS — Z23 Encounter for immunization: Secondary | ICD-10-CM | POA: Diagnosis not present

## 2019-11-29 DIAGNOSIS — Z23 Encounter for immunization: Secondary | ICD-10-CM | POA: Diagnosis not present

## 2019-12-01 ENCOUNTER — Other Ambulatory Visit: Payer: Self-pay | Admitting: Internal Medicine

## 2019-12-01 ENCOUNTER — Other Ambulatory Visit: Payer: Self-pay | Admitting: Family Medicine

## 2019-12-13 ENCOUNTER — Telehealth: Payer: Self-pay

## 2019-12-13 NOTE — Telephone Encounter (Signed)
PA for Ozempic faxed to [place].

## 2019-12-22 ENCOUNTER — Telehealth: Payer: Self-pay

## 2019-12-22 MED ORDER — OZEMPIC (1 MG/DOSE) 2 MG/1.5ML ~~LOC~~ SOPN: PEN_INJECTOR | SUBCUTANEOUS | 3 refills | Status: DC

## 2019-12-22 NOTE — Telephone Encounter (Signed)
Prior authorization for Ozempic has been approved by patient's insurance.  Coverage is effective 12/13/2019 to 12/07/2020  PA Approval/Reference Number [id number]  Approval letter has been sent to [facility].

## 2019-12-31 ENCOUNTER — Other Ambulatory Visit: Payer: Self-pay

## 2020-01-02 ENCOUNTER — Other Ambulatory Visit: Payer: Self-pay | Admitting: Internal Medicine

## 2020-01-03 ENCOUNTER — Encounter: Payer: Self-pay | Admitting: Family Medicine

## 2020-01-03 NOTE — Telephone Encounter (Signed)
Patient wants to know which labs you want her to do?

## 2020-01-04 ENCOUNTER — Encounter: Payer: Self-pay | Admitting: Internal Medicine

## 2020-01-04 ENCOUNTER — Other Ambulatory Visit: Payer: Self-pay | Admitting: *Deleted

## 2020-01-04 ENCOUNTER — Ambulatory Visit (INDEPENDENT_AMBULATORY_CARE_PROVIDER_SITE_OTHER): Payer: Medicaid Other | Admitting: Internal Medicine

## 2020-01-04 ENCOUNTER — Other Ambulatory Visit: Payer: Self-pay

## 2020-01-04 VITALS — BP 120/82 | HR 86 | Ht 62.0 in | Wt 175.0 lb

## 2020-01-04 DIAGNOSIS — E1159 Type 2 diabetes mellitus with other circulatory complications: Secondary | ICD-10-CM

## 2020-01-04 DIAGNOSIS — E669 Obesity, unspecified: Secondary | ICD-10-CM

## 2020-01-04 DIAGNOSIS — E782 Mixed hyperlipidemia: Secondary | ICD-10-CM

## 2020-01-04 DIAGNOSIS — I10 Essential (primary) hypertension: Secondary | ICD-10-CM

## 2020-01-04 LAB — POCT GLYCOSYLATED HEMOGLOBIN (HGB A1C): Hemoglobin A1C: 8.1 % — AB (ref 4.0–5.6)

## 2020-01-04 MED ORDER — LANTUS SOLOSTAR 100 UNIT/ML ~~LOC~~ SOPN: PEN_INJECTOR | SUBCUTANEOUS | 3 refills | Status: DC

## 2020-01-04 NOTE — Addendum Note (Signed)
Addended by: Marian Sorrow on: 01/04/2020 09:33 AM   Modules accepted: Orders

## 2020-01-04 NOTE — Progress Notes (Signed)
Patient ID: [name], female   DOB: 19-Jan-1976, 44 y.o.   MRN: [id number]   This visit occurred during the SARS-CoV-2 public health emergency.  Safety protocols were in place, including screening questions prior to the visit, additional usage of staff PPE, and extensive cleaning of exam room while observing appropriate contact time as indicated for disinfecting solutions.   HPI: [name] is a 44 y.o.-year-old female, returning for f/u for DM2, dx in 2005, insulin-dependent since 2018, uncontrolled, without long-term complications.  She previously saw Dr. Dorris Fetch. First visit with me 02/2018.  Last visit 4 months ago.  Reviewed HbA1c levels: Lab Results  Component Value Date   HGBA1C 8.2 (H) 07/14/2019   HGBA1C 8.6 (A) 01/05/2019   HGBA1C 7.7 (A) 06/09/2018   She is currently on:  - Farxiga 5 mg daily in am - started 12/2018 >> 10 mg daily in a.m. - JanuMet 50-1000 x 2 a day - restarted 03/2019, as otherwise she could not tolerate Metformin >> 50-1000 mg 1x a day in am - Ozempic - started 02/2018 >> 1 mg weekly - Glipizide 5 mg before breakfast and dinner-added 08/2019 - Lantus 60 units at bedtime Also, she was on Metformin ER 1000 mg twice a day with meals - started 05/2018 >> GI intolerance She stopped [place] b/c increased urination. She stopped Invokana b/c yeast inf's.  Pt checks her sugars once a day: - am: 100-161, 180, 249 >> 111-195 >> 124, 144-202 >> 117-160, 187, 201-228 after coronavs vaccine - 2h after b'fast: n/c >> 150-210 >> n/c >> 170-215 >> 167-193 - before lunch: n/c >> 118, 139 >> n/c >> 168 - 2h after lunch: n/c >> 174-200 >> n/c >> 168-204 - before dinner: n/c >> 102-130, 151 >> n/c >> 160-170 - 2h after dinner: n/c >> 150 >> 165 >> n/c >> 165-202 - bedtime: 170-190 >> 170 >> n/c - nighttime: n/c Lowest sugar was 69 - pm >> 100 >> 111 >> 124 >> 117; she has hypoglycemia awareness in the 70s. Highest sugar was 249 >> 365 >> 225 >>  228.  Continues to walk for exercise.  Glucometer: Truemetrix   Pt's meals are: - Breakfast: cereal + milk, muffin + fruit - Lunch: salad, sandwich, hamburger - Dinner: meat + veggies  - Snacks: fruit, crackers  -No CKD, last BUN/creatinine:  Lab Results  Component Value Date   BUN 16 07/02/2019   BUN 9 01/01/2019   CREATININE 0.92 07/02/2019   CREATININE 0.83 01/01/2019  She is allergic to ACE inhibitors.  + Dyslipidemia; last set of lipids: Lab Results  Component Value Date   CHOL 157 07/02/2019   HDL 40 (L) 07/02/2019   LDLCALC 99 07/02/2019   TRIG 85 07/02/2019   CHOLHDL 3.9 07/02/2019  On pravastatin 10.  - last eye exam was in 10/2019: No DR reportedly  -She denies numbness and tingling in her feet.  Pt has FH of DM in M.  ROS: Constitutional: no weight gain/no weight loss, no fatigue, no subjective hyperthermia, no subjective hypothermia Eyes: no blurry vision, no xerophthalmia ENT: no sore throat, no nodules palpated in neck, no dysphagia, no odynophagia, no hoarseness Cardiovascular: no CP/no SOB/no palpitations/no leg swelling Respiratory: no cough/no SOB/no wheezing Gastrointestinal: no N/no V/no D/no C/no acid reflux Musculoskeletal: no muscle aches/no joint aches Skin: no rashes, no hair loss Neurological: no tremors/no numbness/no tingling/no dizziness  I reviewed pt's medications, allergies, PMH, social hx, family hx, and changes were documented in the  history of present illness. Otherwise, unchanged from my initial visit note.  Past Medical History:  Diagnosis Date  . Constipation   . GERD (gastroesophageal reflux disease)   . Hypertension   . Morbid obesity ([place])   . NIDDM (non-insulin dependent diabetes mellitus) 2005   Type II  . OSA (obstructive sleep apnea) 06/24/2018   Past Surgical History:  Procedure Laterality Date  . BACK SURGERY  05/25/2010   Dr,Kabell   . CESAREAN SECTION  6/09, 2015  . LUMBAR LAMINECTOMY/DECOMPRESSION  MICRODISCECTOMY Left 09/13/2015   Procedure: Left Lumbar five-Sacral one microdiskectomy;  Surgeon: Ashok Pall, MD;  Location: [facility];  Service: Neurosurgery;  Laterality: Left;  Left Lumbar five-Sacral one microdiskectomy   Social History   Socioeconomic History  . Marital status: Married    Spouse name: Not on file  . Number of children: 2  . Years of education: Not on file  . Highest education level: Not on file  Occupational History  . Occupation: unemployed   Tobacco Use  . Smoking status: Never Smoker  . Smokeless tobacco: Never Used  Substance and Sexual Activity  . Alcohol use: No  . Drug use: No   Current Outpatient Medications on File Prior to Visit  Medication Sig Dispense Refill  . Accu-Chek FastClix Lancets MISC TO check blood glucose TWICE DAILY 102 each 6  . ACCU-CHEK GUIDE test strip USE TO TEST SUGAR THREE TIMES DAILY 100 strip 0  . Blood Glucose Monitoring Suppl (ACCU-CHEK GUIDE ME) w/Device KIT USE AS DIRECTED 1 kit 0  . dapagliflozin propanediol (FARXIGA) 10 MG TABS tablet Take 10 mg by mouth daily before breakfast. 90 tablet 3  . glipiZIDE (GLUCOTROL) 5 MG tablet Take 1 tablet (5 mg total) by mouth 2 (two) times daily before a meal. 180 tablet 3  . GLOBAL EASE INJECT PEN NEEDLES 31G X 8 MM MISC USE WITH LANTUS 100 each 3  . hydrochlorothiazide (HYDRODIURIL) 25 MG tablet TAKE 1 TABLET BY MOUTH EVERY DAY 30 tablet 2  . ibuprofen (ADVIL,MOTRIN) 800 MG tablet Take 1 tablet by mouth 3 (three) times daily as needed.    Marland Kitchen JANUMET 50-1000 MG tablet TAKE 1 TABLET BY MOUTH TWICE DAILY WITH A MEAL 60 tablet 3  . LANTUS SOLOSTAR 100 UNIT/ML Solostar Pen INJECT 60 UNITS INTO THE SKIN DAILY AT 10 IN THE EVENING 15 mL 0  . meloxicam (MOBIC) 7.5 MG tablet Take 1 tablet (7.5 mg total) by mouth daily. 30 tablet 0  . pravastatin (PRAVACHOL) 10 MG tablet TAKE 1 TABLET BY MOUTH AT BEDTIME. 90 tablet 1  . propranolol (INDERAL) 40 MG tablet TAKE 1 TABLET BY MOUTH TWICE DAILY 60  tablet 2  . Semaglutide, 1 MG/DOSE, (OZEMPIC, 1 MG/DOSE,) 2 MG/1.5ML SOPN INJECT ONE MG INTO THE SKIN ONCE A WEEK 6 mL 3   No current facility-administered medications on file prior to visit.   Allergies  Allergen Reactions  . Ace Inhibitors Cough  . Amlodipine Besylate     REACTION: severe edema of legs   Family History  Problem Relation Age of Onset  . Diabetes Father   . Hypertension Father   . Kidney disease Father   . Kidney failure Father   . Diabetes Mother   . Hypertension Mother     PE: BP 120/82 (BP Location: Left Arm, Patient Position: Sitting, Cuff Size: Large)   Pulse 86   Ht '5\' 2"'  (1.575 m)   Wt 175 lb (79.4 kg)   LMP 12/25/2019  SpO2 99%   BMI 32.01 kg/m  Wt Readings from Last 3 Encounters:  01/04/20 175 lb (79.4 kg)  10/28/19 177 lb 3.2 oz (80.4 kg)  09/09/19 173 lb (78.5 kg)   Constitutional: overweight, in NAD Eyes: PERRLA, EOMI, no exophthalmos ENT: moist mucous membranes, no thyromegaly, no cervical lymphadenopathy Cardiovascular: RRR, No MRG Respiratory: CTA B Gastrointestinal: abdomen soft, NT, ND, BS+ Musculoskeletal: no deformities, strength intact in all 4 Skin: moist, warm, no rashes Neurological: no tremor with outstretched hands, DTR normal in all 4  ASSESSMENT: 1. DM2, insulin-dependent, uncontrolled, without long-term complications, but with hyperglycemia  2. HL  3. Obesity class I  PLAN:  1. Patient with longstanding, uncontrolled, type 2 diabetes, on a complex medication regimen with basal insulin, oral medications (DPP 4 inhibitor, Metformin, sulfonylurea-added at last visit, SGLT 2 inhibitor-increased at last visit), and also weekly GLP-1 receptor agonist, with still poor control.  Before last visit HbA1c, was 8.2%, slightly better, but still above goal.  Sugars were still high and occasionally in the 200s before and after breakfast.  She was not checking them later in the day.  I strongly advised her to start checking around  dinnertime, both before, and after the meal.  We also increased Farxiga at that time and added glipizide before breakfast and dinner. -Of note, she cannot tolerate Metformin by itself so we need to continue Janumet.  I did discuss with her that Ozempic and Januvia are in the same class, but for now we have no option of continuing Metformin for her other than staying on Janumet. -At this visit she tells me that she also cannot tolerate the Janumet at night so she only takes it in the morning.  We will continue this for now.  She started today glipizide but she is not waiting 15 to 30 minutes between taking it and eating breakfast or dinner so we will move the glipizide dose in the past of the meal.  Since sugars are still above target at all times of the day, I also advised her to increase the Lantus dose by 10%.  She has no lows.  Highest blood sugars since last visit have been around the coronavirus vaccine.  She also felt poorly around that time. - I suggested to:  Patient Instructions  Please increase: - Lantus 66 units at bedtime  Please continue: - Ozempic 1 mg weekly - JanuMet 50-1000 1x a day in am - Glipizide 5 mg 2x a day  - move these 30 min before meals - Farxiga 10 mg before b'fast  Please return in 3-4 months with your sugar log.  - we checked her HbA1c: 8.1% (slightly lower) - advised to check sugars at different times of the day - 1x a day, rotating check times - advised for yearly eye exams >> she is UTD - return to clinic in 3-4 months  2. HL -Reviewed latest lipid panel from 06/2019: At goal with exception of a slightly low HDL Lab Results  Component Value Date   CHOL 157 07/02/2019   HDL 40 (L) 07/02/2019   LDLCALC 99 07/02/2019   TRIG 85 07/02/2019   CHOLHDL 3.9 07/02/2019  -Continues pravastatin without side effects  3. Obesity class I -She lost 17 pounds right after starting Ozempic, but weight stable at last visit and gained 2 pounds since last visit -continue  SGLT 2 inhibitor and GLP-1 receptor agonist which should also help with weight loss  Philemon Kingdom, MD PhD [facility] End[facility]

## 2020-01-04 NOTE — Patient Instructions (Signed)
Please increase: - Lantus 66 units at bedtime  Please continue: - Ozempic 1 mg weekly - JanuMet 50-1000 1x a day in am - Glipizide 5 mg 2x a day  - move these 30 min before meals - Farxiga 10 mg before b'fast  Please return in 3-4 months with your sugar log.

## 2020-01-11 ENCOUNTER — Other Ambulatory Visit: Payer: Self-pay

## 2020-01-11 ENCOUNTER — Encounter: Payer: Self-pay | Admitting: Family Medicine

## 2020-01-11 ENCOUNTER — Ambulatory Visit (INDEPENDENT_AMBULATORY_CARE_PROVIDER_SITE_OTHER): Payer: Medicaid Other | Admitting: Family Medicine

## 2020-01-11 VITALS — BP 108/62 | HR 85 | Temp 97.1°F | Resp 16 | Ht 62.0 in | Wt 176.0 lb

## 2020-01-11 DIAGNOSIS — I10 Essential (primary) hypertension: Secondary | ICD-10-CM

## 2020-01-11 DIAGNOSIS — E669 Obesity, unspecified: Secondary | ICD-10-CM | POA: Diagnosis not present

## 2020-01-11 DIAGNOSIS — E1159 Type 2 diabetes mellitus with other circulatory complications: Secondary | ICD-10-CM | POA: Diagnosis not present

## 2020-01-11 DIAGNOSIS — E782 Mixed hyperlipidemia: Secondary | ICD-10-CM

## 2020-01-11 DIAGNOSIS — Z1231 Encounter for screening mammogram for malignant neoplasm of breast: Secondary | ICD-10-CM | POA: Diagnosis not present

## 2020-01-11 NOTE — Assessment & Plan Note (Signed)
Hyperlipidemia:Low fat diet discussed and encouraged.   Lipid Panel  Lab Results  Component Value Date   CHOL 157 07/02/2019   HDL 40 (L) 07/02/2019   LDLCALC 99 07/02/2019   TRIG 85 07/02/2019   CHOLHDL 3.9 07/02/2019   Updated lab needed at/ before next visit.

## 2020-01-11 NOTE — Assessment & Plan Note (Signed)
Managed by endo, improving [name] is reminded of the importance of commitment to daily physical activity for 30 minutes or more, as able and the need to limit carbohydrate intake to 30 to 60 grams per meal to help with blood sugar control.   The need to take medication as prescribed, test blood sugar as directed, and to call between visits if there is a concern that blood sugar is uncontrolled is also discussed.   [name] is reminded of the importance of daily foot exam, annual eye examination, and good blood sugar, blood pressure and cholesterol control.  Diabetic Labs Latest Ref Rng & Units 01/04/2020 07/14/2019 07/02/2019 01/07/2019 01/05/2019  HbA1c 4.0 - 5.6 % 8.1(A) 8.2(H) - - 8.6(A)  Microalbumin Not Estab. ug/mL - - - 19.5(H) -  Micro/Creat Ratio 0 - 29 mg/g creat - - - 13 -  Chol <200 mg/dL - - 157 - -  HDL > OR = 50 mg/dL - - 40(L) - -  Calc LDL mg/dL (calc) - - 99 - -  Triglycerides <150 mg/dL - - 85 - -  Creatinine 0.50 - 1.10 mg/dL - - 0.92 - -   BP/Weight 01/11/2020 01/04/2020 10/28/2019 09/09/2019 07/12/2019 01/06/2019 123456  Systolic BP 123XX123 123456 123XX123 123456 [id number]  Diastolic BP 62 82 74 70 64 64 80  Wt. (Lbs) 176 175 177.2 173 171 171 170  BMI 32.19 32.01 32.41 31.64 31.28 31.28 31.09   Foot/eye exam completion dates Latest Ref Rng & Units 01/11/2020 04/27/2018  Eye Exam No Retinopathy - -  Foot exam Order - - -  Foot Form Completion - Done Done

## 2020-01-11 NOTE — Assessment & Plan Note (Signed)
  Patient re-educated about  the importance of commitment to a  minimum of 150 minutes of exercise per week as able.  The importance of healthy food choices with portion control discussed, as well as eating regularly and within a 12 hour window most days. The need to choose "clean , green" food 50 to 75% of the time is discussed, as well as to make water the primary drink and set a goal of 64 ounces water daily.    Weight /BMI 01/11/2020 01/04/2020 10/28/2019  WEIGHT 176 lb 175 lb 177 lb 3.2 oz  HEIGHT 5\' 2"  5\' 2"  5\' 2"   BMI 32.19 kg/m2 32.01 kg/m2 32.41 kg/m2

## 2020-01-11 NOTE — Assessment & Plan Note (Signed)
Controlled, no change in medication DASH diet and commitment to daily physical activity for a minimum of 30 minutes discussed and encouraged, as a part of hypertension management. The importance of attaining a healthy weight is also discussed.  BP/Weight 01/11/2020 01/04/2020 10/28/2019 09/09/2019 07/12/2019 01/06/2019 123456  Systolic BP 123XX123 123456 [id number] 123456 [id number] [id number]  Diastolic BP 62 82 74 70 64 64 80  Wt. (Lbs) 176 175 177.2 173 171 171 170  BMI 32.19 32.01 32.41 31.64 31.28 31.28 31.09

## 2020-01-11 NOTE — Patient Instructions (Addendum)
F/u in office with MD in mid October, call if you need me sooner  We will send for pap smear from [place] [facility] and also for your eye exam with Dr Radford Pax.  Please get mammogram when due, we will schedule  Please get fasting labs with urine sample this week  Please check  Your pharmacy for the tdap vaccine, this is overdue    It is important that you exercise regularly at least 30 minutes 5 times a week. If you develop chest pain, have severe difficulty breathing, or feel very tired, stop exercising immediately and seek medical attention   Looking for hBA1C less than 8 at next visit, you CAN do this!  Thanks for choosing [facility], we consider it a privelige to serve you.

## 2020-01-11 NOTE — Progress Notes (Signed)
[name]     MRN: [id number]      DOB: [birth year]   HPI [name] is here for follow up and re-evaluation of chronic medical conditions, medication management and review of any available recent lab and radiology data.  Preventive health is updated, specifically  Cancer screening and Immunization.   Questions or concerns regarding consultations or procedures which the PT has had in the interim are  addressed. The PT denies any adverse reactions to current medications since the last visit.  There are no new concerns.  There are no specific complaints Denies polyuria, polydipsia, blurred vision , or hypoglycemic episodes.    ROS Denies recent fever or chills. Denies sinus pressure, nasal congestion, ear pain or sore throat. Denies chest congestion, productive cough or wheezing. Denies chest pains, palpitations and leg swelling Denies abdominal pain, nausea, vomiting,diarrhea or constipation.   Denies dysuria, frequency, hesitancy or incontinence. Denies joint pain, swelling and limitation in mobility. Denies uncontrolled  headaches, seizures, numbness, or tingling. Denies depression, anxiety or insomnia. Denies skin break down or rash.   PE  BP 108/62   Pulse 85   Temp (!) 97.1 F (36.2 C) (Temporal)   Resp 16   Ht 5\' 2"  (1.575 m)   Wt 176 lb (79.8 kg)   LMP 12/25/2019   SpO2 97%   BMI 32.19 kg/m   Patient alert and oriented and in no cardiopulmonary distress.  HEENT: No facial asymmetry, EOMI,     Neck supple .  Chest: Clear to auscultation bilaterally.  CVS: S1, S2 no murmurs, no S3.Regular rate.  ABD: Soft non tender.   Ext: No edema  MS: Adequate ROM spine, shoulders, hips and knees.  Skin: Intact, no ulcerations or rash noted.  Psych: Good eye contact, normal affect. Memory intact not anxious or depressed appearing.  CNS: CN 2-12 intact, power,  normal throughout.no focal deficits noted.   Assessment & Plan  Type 2 diabetes mellitus  with vascular disease ([place]) Managed by endo, improving [name] is reminded of the importance of commitment to daily physical activity for 30 minutes or more, as able and the need to limit carbohydrate intake to 30 to 60 grams per meal to help with blood sugar control.   The need to take medication as prescribed, test blood sugar as directed, and to call between visits if there is a concern that blood sugar is uncontrolled is also discussed.   [name] is reminded of the importance of daily foot exam, annual eye examination, and good blood sugar, blood pressure and cholesterol control.  Diabetic Labs Latest Ref Rng & Units 01/04/2020 07/14/2019 07/02/2019 01/07/2019 01/05/2019  HbA1c 4.0 - 5.6 % 8.1(A) 8.2(H) - - 8.6(A)  Microalbumin Not Estab. ug/mL - - - 19.5(H) -  Micro/Creat Ratio 0 - 29 mg/g creat - - - 13 -  Chol <200 mg/dL - - 157 - -  HDL > OR = 50 mg/dL - - 40(L) - -  Calc LDL mg/dL (calc) - - 99 - -  Triglycerides <150 mg/dL - - 85 - -  Creatinine 0.50 - 1.10 mg/dL - - 0.92 - -   BP/Weight 01/11/2020 01/04/2020 10/28/2019 09/09/2019 07/12/2019 01/06/2019 123456  Systolic BP 123XX123 123456 123XX123 123456 [id number] [id number]  Diastolic BP 62 82 74 70 64 64 80  Wt. (Lbs) 176 175 177.2 173 171 171 170  BMI 32.19 32.01 32.41 31.64 31.28 31.28 31.09   Foot/eye exam completion dates Latest Ref Rng &  Units 01/11/2020 04/27/2018  Eye Exam No Retinopathy - -  Foot exam Order - - -  Foot Form Completion - Done Done        Essential hypertension Controlled, no change in medication DASH diet and commitment to daily physical activity for a minimum of 30 minutes discussed and encouraged, as a part of hypertension management. The importance of attaining a healthy weight is also discussed.  BP/Weight 01/11/2020 01/04/2020 10/28/2019 09/09/2019 07/12/2019 01/06/2019 123456  Systolic BP 123XX123 123456 [id number] 123456 [id number] 123456  Diastolic BP 62 82 74 70 64 64 80  Wt. (Lbs) 176 175 177.2 173 171 171 170  BMI 32.19 32.01 32.41  31.64 31.28 31.28 31.09       Mixed hyperlipidemia Hyperlipidemia:Low fat diet discussed and encouraged.   Lipid Panel  Lab Results  Component Value Date   CHOL 157 07/02/2019   HDL 40 (L) 07/02/2019   LDLCALC 99 07/02/2019   TRIG 85 07/02/2019   CHOLHDL 3.9 07/02/2019   Updated lab needed at/ before next visit.     Obesity (BMI 30.0-34.9)  Patient re-educated about  the importance of commitment to a  minimum of 150 minutes of exercise per week as able.  The importance of healthy food choices with portion control discussed, as well as eating regularly and within a 12 hour window most days. The need to choose "clean , green" food 50 to 75% of the time is discussed, as well as to make water the primary drink and set a goal of 64 ounces water daily.    Weight /BMI 01/11/2020 01/04/2020 10/28/2019  WEIGHT 176 lb 175 lb 177 lb 3.2 oz  HEIGHT 5\' 2"  5\' 2"  5\' 2"   BMI 32.19 kg/m2 32.01 kg/m2 32.41 kg/m2

## 2020-01-13 ENCOUNTER — Encounter: Payer: Self-pay | Admitting: Internal Medicine

## 2020-01-13 ENCOUNTER — Other Ambulatory Visit: Payer: Self-pay

## 2020-01-13 DIAGNOSIS — E1159 Type 2 diabetes mellitus with other circulatory complications: Secondary | ICD-10-CM | POA: Diagnosis not present

## 2020-01-13 DIAGNOSIS — E782 Mixed hyperlipidemia: Secondary | ICD-10-CM | POA: Diagnosis not present

## 2020-01-13 DIAGNOSIS — I10 Essential (primary) hypertension: Secondary | ICD-10-CM | POA: Diagnosis not present

## 2020-01-13 MED ORDER — JANUMET 50-1000 MG PO TABS: ORAL_TABLET | ORAL | 3 refills | Status: DC

## 2020-01-14 ENCOUNTER — Other Ambulatory Visit: Payer: Self-pay | Admitting: Emergency Medicine

## 2020-01-14 ENCOUNTER — Encounter: Payer: Self-pay | Admitting: Family Medicine

## 2020-01-14 DIAGNOSIS — E1159 Type 2 diabetes mellitus with other circulatory complications: Secondary | ICD-10-CM

## 2020-01-14 LAB — CBC
HCT: 38.5 % (ref 35.0–45.0)
Hemoglobin: 12.2 g/dL (ref 11.7–15.5)
MCH: 25.6 pg — ABNORMAL LOW (ref 27.0–33.0)
MCHC: 31.7 g/dL — ABNORMAL LOW (ref 32.0–36.0)
MCV: 80.9 fL (ref 80.0–100.0)
MPV: 13.1 fL — ABNORMAL HIGH (ref 7.5–12.5)
Platelets: 212 10*3/uL (ref 140–400)
RBC: 4.76 10*6/uL (ref 3.80–5.10)
RDW: 13.5 % (ref 11.0–15.0)
WBC: 7.7 10*3/uL (ref 3.8–10.8)

## 2020-01-14 LAB — COMPLETE METABOLIC PANEL WITH GFR
AG Ratio: 1.3 (calc) (ref 1.0–2.5)
ALT: 19 U/L (ref 6–29)
AST: 15 U/L (ref 10–30)
Albumin: 3.8 g/dL (ref 3.6–5.1)
Alkaline phosphatase (APISO): 77 U/L (ref 31–125)
BUN: 12 mg/dL (ref 7–25)
CO2: 31 mmol/L (ref 20–32)
Calcium: 9.2 mg/dL (ref 8.6–10.2)
Chloride: 97 mmol/L — ABNORMAL LOW (ref 98–110)
Creat: 0.9 mg/dL (ref 0.50–1.10)
GFR, Est African American: 91 mL/min/{1.73_m2} (ref >=60)
GFR, Est Non African American: 79 mL/min/{1.73_m2} (ref >=60)
Globulin: 2.9 g/dL (calc) (ref 1.9–3.7)
Glucose, Bld: 208 mg/dL — ABNORMAL HIGH (ref 65–99)
Potassium: 3.5 mmol/L (ref 3.5–5.3)
Sodium: 139 mmol/L (ref 135–146)
Total Bilirubin: 0.4 mg/dL (ref 0.2–1.2)
Total Protein: 6.7 g/dL (ref 6.1–8.1)

## 2020-01-14 LAB — MICROALBUMIN, URINE: Microalb, Ur: 19.6 mg/dL

## 2020-01-14 LAB — LIPID PANEL
Cholesterol: 139 mg/dL (ref <200)
HDL: 42 mg/dL — ABNORMAL LOW (ref >=50)
LDL Cholesterol (Calc): 81 mg/dL (calc)
Non-HDL Cholesterol (Calc): 97 mg/dL (calc) (ref <130)
Total CHOL/HDL Ratio: 3.3 (calc) (ref <5.0)
Triglycerides: 77 mg/dL (ref <150)

## 2020-01-14 MED ORDER — ACCU-CHEK FASTCLIX LANCETS MISC: 6 refills | Status: DC

## 2020-01-14 MED ORDER — ACCU-CHEK GUIDE VI STRP: ORAL_STRIP | 0 refills | Status: DC

## 2020-01-21 ENCOUNTER — Encounter: Payer: Self-pay | Admitting: Internal Medicine

## 2020-02-10 ENCOUNTER — Other Ambulatory Visit: Payer: Self-pay | Admitting: Family Medicine

## 2020-02-10 DIAGNOSIS — E1159 Type 2 diabetes mellitus with other circulatory complications: Secondary | ICD-10-CM

## 2020-02-24 ENCOUNTER — Encounter: Payer: Self-pay | Admitting: Internal Medicine

## 2020-02-24 ENCOUNTER — Other Ambulatory Visit: Payer: Self-pay

## 2020-02-24 ENCOUNTER — Ambulatory Visit (INDEPENDENT_AMBULATORY_CARE_PROVIDER_SITE_OTHER): Payer: Medicaid Other | Admitting: Internal Medicine

## 2020-02-24 VITALS — BP 116/80 | HR 87 | Resp 16 | Ht 62.0 in | Wt 183.0 lb

## 2020-02-24 DIAGNOSIS — R55 Syncope and collapse: Secondary | ICD-10-CM | POA: Diagnosis not present

## 2020-02-24 DIAGNOSIS — I10 Essential (primary) hypertension: Secondary | ICD-10-CM

## 2020-02-24 DIAGNOSIS — E119 Type 2 diabetes mellitus without complications: Secondary | ICD-10-CM | POA: Diagnosis not present

## 2020-02-24 NOTE — Progress Notes (Signed)
Established Patient Office Visit  Subjective:  Patient ID: [name], female    DOB: March 16, 1976  Age: 44 y.o. MRN: [id number]  CC:  Chief Complaint  Patient presents with  . Dizziness    started end of last week.nothing makes it better.nothing makes it worse    HPI [name] is a very pleasant 44 year old female with past medical history of diabetes, hypertension, hyperlipidemia, morbid obesity with BMI of 33 presents in our clinic with complaint of lightheadedness and dizziness since 1 week.  Patient tells me that she is lightheaded, dizzy, about to fall on the ground, her symptoms comes and goes, mostly occurs in the daytime.  She denies head trauma, loss of consciousness, seizure, chest pain, shortness of breath, palpitation, fever, chills, nausea, vomiting, abdominal pain, urinary or bowel changes.  She takes propranolol and HCTZ for her blood pressure.  Patient tells me that her propranolol was started long time ago due to history of headache and blood pressure.  She tells me that her blood pressure runs in the 90s over 60s usually.  She tells me that her Lantus dose recently increased and her blood sugar is improving.   She drinks about 3 bottles (16 0z) per day.  No history of smoking, alcohol, illicit drug use.  Past Medical History:  Diagnosis Date  . Constipation   . GERD (gastroesophageal reflux disease)   . Hypertension   . Morbid obesity ([place])   . NIDDM (non-insulin dependent diabetes mellitus) 2005   Type II  . OSA (obstructive sleep apnea) 06/24/2018    Past Surgical History:  Procedure Laterality Date  . BACK SURGERY  05/25/2010   Dr,Kabell   . CESAREAN SECTION  6/09, 2015  . LUMBAR LAMINECTOMY/DECOMPRESSION MICRODISCECTOMY Left 09/13/2015   Procedure: Left Lumbar five-Sacral one microdiskectomy;  Surgeon: Ashok Pall, MD;  Location: [facility];  Service: Neurosurgery;  Laterality: Left;  Left Lumbar five-Sacral one microdiskectomy     Family History  Problem Relation Age of Onset  . Diabetes Father   . Hypertension Father   . Kidney disease Father   . Kidney failure Father   . Diabetes Mother   . Hypertension Mother     Social History   Socioeconomic History  . Marital status: Married    Spouse name: Not on file  . Number of children: Not on file  . Years of education: Not on file  . Highest education level: Not on file  Occupational History  . Occupation: unemployed   Tobacco Use  . Smoking status: Never Smoker  . Smokeless tobacco: Never Used  Vaping Use  . Vaping Use: Never used  Substance and Sexual Activity  . Alcohol use: No  . Drug use: No  . Sexual activity: Yes    Birth control/protection: None  Other Topics Concern  . Not on file  Social History Narrative  . Not on file   Social Determinants of Health   Financial Resource Strain:   . Difficulty of Paying Living Expenses:   Food Insecurity:   . Worried About Charity fundraiser in the Last Year:   . Arboriculturist in the Last Year:   Transportation Needs:   . Film/video editor (Medical):   Marland Kitchen Lack of Transportation (Non-Medical):   Physical Activity:   . Days of Exercise per Week:   . Minutes of Exercise per Session:   Stress:   . Feeling of Stress :   Social Connections:   .  Frequency of Communication with Friends and Family:   . Frequency of Social Gatherings with Friends and Family:   . Attends Religious Services:   . Active Member of Clubs or Organizations:   . Attends Archivist Meetings:   Marland Kitchen Marital Status:   Intimate Partner Violence:   . Fear of Current or Ex-Partner:   . Emotionally Abused:   Marland Kitchen Physically Abused:   . Sexually Abused:     Outpatient Medications Prior to Visit  Medication Sig Dispense Refill  . Accu-Chek FastClix Lancets MISC TO check blood glucose Three times DAILY 102 each 6  . Blood Glucose Monitoring Suppl (ACCU-CHEK GUIDE ME) w/Device KIT USE AS DIRECTED 1 kit 0  .  dapagliflozin propanediol (FARXIGA) 10 MG TABS tablet Take 10 mg by mouth daily before breakfast. 90 tablet 3  . glipiZIDE (GLUCOTROL) 5 MG tablet Take 1 tablet (5 mg total) by mouth 2 (two) times daily before a meal. 180 tablet 3  . GLOBAL EASE INJECT PEN NEEDLES 31G X 8 MM MISC USE WITH LANTUS 100 each 3  . glucose blood (ACCU-CHEK GUIDE) test strip USE TO TEST BLOOD SUGAR THREE TIMES DAILY 100 strip 0  . hydrochlorothiazide (HYDRODIURIL) 25 MG tablet TAKE 1 TABLET BY MOUTH EVERY DAY 30 tablet 2  . insulin glargine (LANTUS SOLOSTAR) 100 UNIT/ML Solostar Pen INJECT 66 UNITS INTO THE SKIN DAILY AT 10 IN THE EVENING 45 mL 3  . pravastatin (PRAVACHOL) 10 MG tablet TAKE 1 TABLET BY MOUTH AT BEDTIME. 90 tablet 1  . Semaglutide, 1 MG/DOSE, (OZEMPIC, 1 MG/DOSE,) 2 MG/1.5ML SOPN INJECT ONE MG INTO THE SKIN ONCE A WEEK 6 mL 3  . sitaGLIPtin-metformin (JANUMET) 50-1000 MG tablet TAKE 1 TABLET BY MOUTH TWICE DAILY WITH A MEAL 60 tablet 3  . propranolol (INDERAL) 40 MG tablet TAKE 1 TABLET BY MOUTH TWICE DAILY 60 tablet 2   No facility-administered medications prior to visit.    Allergies  Allergen Reactions  . Ace Inhibitors Cough  . Amlodipine Besylate     REACTION: severe edema of legs    ROS Review of Systems  Constitutional: Negative.   HENT: Negative.   Eyes: Negative.   Respiratory: Negative.   Cardiovascular: Negative.   Gastrointestinal: Negative.   Endocrine: Negative.   Genitourinary: Negative.   Musculoskeletal: Negative.   Allergic/Immunologic: Negative.   Neurological: Positive for dizziness and light-headedness.  Hematological: Negative.   Psychiatric/Behavioral: Negative.       Objective:    Physical Exam Constitutional:      Appearance: Normal appearance. She is obese.  HENT:     Head: Normocephalic and atraumatic.     Nose: Nose normal.     Mouth/Throat:     Mouth: Mucous membranes are moist.     Pharynx: Oropharynx is clear.  Eyes:     Extraocular Movements:  Extraocular movements intact.     Conjunctiva/sclera: Conjunctivae normal.     Pupils: Pupils are equal, round, and reactive to light.  Cardiovascular:     Rate and Rhythm: Normal rate and regular rhythm.     Pulses: Normal pulses.     Heart sounds: Normal heart sounds.  Pulmonary:     Effort: Pulmonary effort is normal.     Breath sounds: Normal breath sounds.  Abdominal:     General: Abdomen is flat. Bowel sounds are normal.     Palpations: Abdomen is soft.  Musculoskeletal:        General: Normal range of motion.  Cervical back: Normal range of motion.  Neurological:     General: No focal deficit present.     Mental Status: She is alert and oriented to person, place, and time.  Psychiatric:        Mood and Affect: Mood normal.        Behavior: Behavior normal.        Thought Content: Thought content normal.        Judgment: Judgment normal.     BP 116/80   Pulse 87   Resp 16   Ht '5\' 2"'  (1.575 m)   Wt 183 lb (83 kg)   SpO2 94%   BMI 33.47 kg/m  Wt Readings from Last 3 Encounters:  02/24/20 183 lb (83 kg)  01/11/20 176 lb (79.8 kg)  01/04/20 175 lb (79.4 kg)     Health Maintenance Due  Topic Date Due  . Hepatitis C Screening  Never done  . OPHTHALMOLOGY EXAM  10/29/2018  . PAP SMEAR-Modifier  10/29/2019    There are no preventive care reminders to display for this patient.  Lab Results  Component Value Date   TSH 0.62 01/01/2019   Lab Results  Component Value Date   WBC 7.7 01/13/2020   HGB 12.2 01/13/2020   HCT 38.5 01/13/2020   MCV 80.9 01/13/2020   PLT 212 01/13/2020   Lab Results  Component Value Date   NA 139 01/13/2020   K 3.5 01/13/2020   CO2 31 01/13/2020   GLUCOSE 208 (H) 01/13/2020   BUN 12 01/13/2020   CREATININE 0.90 01/13/2020   BILITOT 0.4 01/13/2020   ALKPHOS 69 01/14/2017   AST 15 01/13/2020   ALT 19 01/13/2020   PROT 6.7 01/13/2020   ALBUMIN 4.0 01/14/2017   CALCIUM 9.2 01/13/2020   ANIONGAP 10 09/13/2015   Lab  Results  Component Value Date   CHOL 139 01/13/2020   Lab Results  Component Value Date   HDL 42 (L) 01/13/2020   Lab Results  Component Value Date   LDLCALC 81 01/13/2020   Lab Results  Component Value Date   TRIG 77 01/13/2020   Lab Results  Component Value Date   CHOLHDL 3.3 01/13/2020   Lab Results  Component Value Date   HGBA1C 8.1 (A) 01/04/2020      Assessment & Plan:   Problem List Items Addressed This Visit    None    Visit Diagnoses    Near syncope    -  Primary   HTN (hypertension), benign       Diabetes mellitus without complication ([place])         Near syncope: -Likely secondary to orthostatic hypotension due to polypharmacy-she takes HCTZ and propranolol however as per patient her blood pressure runs in 90s over 60s.  Today her blood pressure is 116/80. -Will discontinue propanolol for now.  Continue same dose of HCTZ.  Advised patient to check blood pressure atleast 3 times in a week and bring log on next visit.  Follow-up in 2 weeks.  If blood pressure remains on lower side will decrease dose of HCTZ from 25 mg to 12.5 mg once daily. -Encourage increased fluid intake. -Follow-up in 2 weeks  Hypertension: On lower side -On HCTZ and propanolol -Discontinue propanolol.  Continue HCTZ  Diabetes mellitus: Uncontrolled -Last A1c 8.1% -Continue Janumet, Ozempic and Lantus 66 units at bedtime -Monitor blood sugar closely.   No orders of the defined types were placed in this encounter.   Follow-up: Return in  about 3 months (around 05/26/2020).    Mckinley Jewel, MD

## 2020-02-24 NOTE — Patient Instructions (Signed)
Follow up with me in 2 weeks Stop propanolol  Drink plenty of water Check BP 3 times/week & bring log on next visit Check BS regularly If BP remains on lower side-will decrease HCTZ dose.

## 2020-03-01 ENCOUNTER — Other Ambulatory Visit: Payer: Self-pay | Admitting: Family Medicine

## 2020-03-09 ENCOUNTER — Encounter: Payer: Self-pay | Admitting: Internal Medicine

## 2020-03-09 ENCOUNTER — Other Ambulatory Visit: Payer: Self-pay

## 2020-03-09 ENCOUNTER — Ambulatory Visit (INDEPENDENT_AMBULATORY_CARE_PROVIDER_SITE_OTHER): Payer: Medicaid Other | Admitting: Internal Medicine

## 2020-03-09 VITALS — BP 110/78 | HR 68 | Resp 16 | Ht 62.0 in | Wt 179.0 lb

## 2020-03-09 DIAGNOSIS — E119 Type 2 diabetes mellitus without complications: Secondary | ICD-10-CM

## 2020-03-09 DIAGNOSIS — Z6832 Body mass index (BMI) 32.0-32.9, adult: Secondary | ICD-10-CM | POA: Diagnosis not present

## 2020-03-09 DIAGNOSIS — I10 Essential (primary) hypertension: Secondary | ICD-10-CM | POA: Diagnosis not present

## 2020-03-09 DIAGNOSIS — G4733 Obstructive sleep apnea (adult) (pediatric): Secondary | ICD-10-CM | POA: Diagnosis not present

## 2020-03-09 DIAGNOSIS — E6609 Other obesity due to excess calories: Secondary | ICD-10-CM | POA: Diagnosis not present

## 2020-03-09 NOTE — Patient Instructions (Signed)
Follow up in 3 months Repeat A1c on next visit BP-is amazing. Cont. HCTZ Diet/exercise/weight loss recommended

## 2020-03-09 NOTE — Progress Notes (Signed)
Established Patient Office Visit  Subjective:  Patient ID: [name], female    DOB: [birth year]  Age: 44 y.o. MRN: [id number]  CC:  Chief Complaint  Patient presents with  . Hypertension    follow up    HPI [name] is a very pleasant 44 year old female with past medical history of hypertension, diabetes mellitus, morbid obesity, mild obstructive sleep apnea-not on CPAP, chronic back pain presents to our clinic for follow-up on her chronic medical issues.  On previous clinic visit: Patient presented with hypotension and lightheadedness/dizziness therefore propanolol was discontinued.  She was advised to check blood pressure every day and bring log on next visit.  Today patient tells me that she is doing fine after discontinuing propranolol, denies any complaints.  Her blood pressure runs within normal limits at home.  She takes HCTZ for hypertension.  She denies further episodes of lightheadedness, dizziness, headache, blurry vision, chest pain, shortness of breath, palpitation, leg swelling, fever, chills, cough or congestion, urinary or bowel changes.  She takes Janumet for diabetes.  She reports being compliant with medication.  She had ophthalmology exam in March 21.  She tells me that she is scheduled for screening Pap smear next week with her OB/GYN.  She is up-to-date on vaccination including COVID-19.  She is due for mammogram in September 21.  Past Medical History:  Diagnosis Date  . Constipation   . GERD (gastroesophageal reflux disease)   . Hypertension   . Morbid obesity ([place])   . NIDDM (non-insulin dependent diabetes mellitus) 2005   Type II  . OSA (obstructive sleep apnea) 06/24/2018    Past Surgical History:  Procedure Laterality Date  . BACK SURGERY  05/25/2010   Dr,Kabell   . CESAREAN SECTION  6/09, 2015  . LUMBAR LAMINECTOMY/DECOMPRESSION MICRODISCECTOMY Left 09/13/2015   Procedure: Left Lumbar five-Sacral one microdiskectomy;  Surgeon:  Ashok Pall, MD;  Location: [facility];  Service: Neurosurgery;  Laterality: Left;  Left Lumbar five-Sacral one microdiskectomy    Family History  Problem Relation Age of Onset  . Diabetes Father   . Hypertension Father   . Kidney disease Father   . Kidney failure Father   . Diabetes Mother   . Hypertension Mother     Social History   Socioeconomic History  . Marital status: Married    Spouse name: Not on file  . Number of children: Not on file  . Years of education: Not on file  . Highest education level: Not on file  Occupational History  . Occupation: unemployed   Tobacco Use  . Smoking status: Never Smoker  . Smokeless tobacco: Never Used  Vaping Use  . Vaping Use: Never used  Substance and Sexual Activity  . Alcohol use: No  . Drug use: No  . Sexual activity: Yes    Birth control/protection: None  Other Topics Concern  . Not on file  Social History Narrative  . Not on file   Social Determinants of Health   Financial Resource Strain:   . Difficulty of Paying Living Expenses:   Food Insecurity:   . Worried About Charity fundraiser in the Last Year:   . Arboriculturist in the Last Year:   Transportation Needs:   . Film/video editor (Medical):   Marland Kitchen Lack of Transportation (Non-Medical):   Physical Activity:   . Days of Exercise per Week:   . Minutes of Exercise per Session:   Stress:   . Feeling  of Stress :   Social Connections:   . Frequency of Communication with Friends and Family:   . Frequency of Social Gatherings with Friends and Family:   . Attends Religious Services:   . Active Member of Clubs or Organizations:   . Attends Archivist Meetings:   Marland Kitchen Marital Status:   Intimate Partner Violence:   . Fear of Current or Ex-Partner:   . Emotionally Abused:   Marland Kitchen Physically Abused:   . Sexually Abused:     Outpatient Medications Prior to Visit  Medication Sig Dispense Refill  . Accu-Chek FastClix Lancets MISC TO check blood glucose  Three times DAILY 102 each 6  . Blood Glucose Monitoring Suppl (ACCU-CHEK GUIDE ME) w/Device KIT USE AS DIRECTED 1 kit 0  . dapagliflozin propanediol (FARXIGA) 10 MG TABS tablet Take 10 mg by mouth daily before breakfast. 90 tablet 3  . glipiZIDE (GLUCOTROL) 5 MG tablet Take 1 tablet (5 mg total) by mouth 2 (two) times daily before a meal. 180 tablet 3  . GLOBAL EASE INJECT PEN NEEDLES 31G X 8 MM MISC USE WITH LANTUS 100 each 3  . glucose blood (ACCU-CHEK GUIDE) test strip USE TO TEST BLOOD SUGAR THREE TIMES DAILY 100 strip 0  . hydrochlorothiazide (HYDRODIURIL) 25 MG tablet TAKE 1 TABLET BY MOUTH EVERY DAY 30 tablet 2  . insulin glargine (LANTUS SOLOSTAR) 100 UNIT/ML Solostar Pen INJECT 66 UNITS INTO THE SKIN DAILY AT 10 IN THE EVENING 45 mL 3  . pravastatin (PRAVACHOL) 10 MG tablet TAKE 1 TABLET BY MOUTH AT BEDTIME. 90 tablet 1  . Semaglutide, 1 MG/DOSE, (OZEMPIC, 1 MG/DOSE,) 2 MG/1.5ML SOPN INJECT ONE MG INTO THE SKIN ONCE A WEEK 6 mL 3  . sitaGLIPtin-metformin (JANUMET) 50-1000 MG tablet TAKE 1 TABLET BY MOUTH TWICE DAILY WITH A MEAL 60 tablet 3   No facility-administered medications prior to visit.    Allergies  Allergen Reactions  . Ace Inhibitors Cough  . Amlodipine Besylate     REACTION: severe edema of legs    ROS Review of Systems  Constitutional: Negative.   HENT: Negative.   Eyes: Negative.   Respiratory: Negative.   Cardiovascular: Negative.   Gastrointestinal: Negative.   Endocrine: Negative.   Genitourinary: Negative.   Musculoskeletal: Negative.   Allergic/Immunologic: Negative.   Neurological: Negative.   Hematological: Negative.   Psychiatric/Behavioral: Negative.       Objective:    Physical Exam Constitutional:      Appearance: She is obese.  HENT:     Head: Normocephalic and atraumatic.     Mouth/Throat:     Mouth: Mucous membranes are moist.  Eyes:     Extraocular Movements: Extraocular movements intact.     Conjunctiva/sclera: Conjunctivae  normal.     Pupils: Pupils are equal, round, and reactive to light.  Cardiovascular:     Rate and Rhythm: Normal rate and regular rhythm.     Pulses: Normal pulses.     Heart sounds: Normal heart sounds.  Pulmonary:     Effort: Pulmonary effort is normal.     Breath sounds: Normal breath sounds.  Abdominal:     General: Abdomen is flat. Bowel sounds are normal.     Palpations: Abdomen is soft.  Musculoskeletal:        General: Normal range of motion.     Cervical back: Normal range of motion and neck supple.  Neurological:     General: No focal deficit present.     Mental  Status: She is alert and oriented to person, place, and time.  Psychiatric:        Mood and Affect: Mood normal.        Behavior: Behavior normal.        Thought Content: Thought content normal.        Judgment: Judgment normal.     BP 110/78   Pulse 68   Resp 16   Ht '5\' 2"'  (1.575 m)   Wt 179 lb 0.6 oz (81.2 kg)   BMI 32.75 kg/m  Wt Readings from Last 3 Encounters:  03/09/20 179 lb 0.6 oz (81.2 kg)  02/24/20 183 lb (83 kg)  01/11/20 176 lb (79.8 kg)     Health Maintenance Due  Topic Date Due  . Hepatitis C Screening  Never done  . PAP SMEAR-Modifier  10/31/2019    There are no preventive care reminders to display for this patient.  Lab Results  Component Value Date   TSH 0.62 01/01/2019   Lab Results  Component Value Date   WBC 7.7 01/13/2020   HGB 12.2 01/13/2020   HCT 38.5 01/13/2020   MCV 80.9 01/13/2020   PLT 212 01/13/2020   Lab Results  Component Value Date   NA 139 01/13/2020   K 3.5 01/13/2020   CO2 31 01/13/2020   GLUCOSE 208 (H) 01/13/2020   BUN 12 01/13/2020   CREATININE 0.90 01/13/2020   BILITOT 0.4 01/13/2020   ALKPHOS 69 01/14/2017   AST 15 01/13/2020   ALT 19 01/13/2020   PROT 6.7 01/13/2020   ALBUMIN 4.0 01/14/2017   CALCIUM 9.2 01/13/2020   ANIONGAP 10 09/13/2015   Lab Results  Component Value Date   CHOL 139 01/13/2020   Lab Results  Component Value  Date   HDL 42 (L) 01/13/2020   Lab Results  Component Value Date   LDLCALC 81 01/13/2020   Lab Results  Component Value Date   TRIG 77 01/13/2020   Lab Results  Component Value Date   CHOLHDL 3.3 01/13/2020   Lab Results  Component Value Date   HGBA1C 8.1 (A) 01/04/2020      Assessment & Plan:   Problem List Items Addressed This Visit      Cardiovascular and Mediastinum   Essential hypertension - Primary    Other Visit Diagnoses    Type 2 diabetes mellitus without complication, without long-term current use of insulin ([facility])       Class 1 obesity due to excess calories without serious comorbidity with body mass index (BMI) of 32.0 to 32.9 in adult       Obstructive sleep apnea syndrome         Essential hypertension: Well-controlled -Propranolol was discontinued on previous visit.  Continue HCTZ.  Monitor blood pressure closely at home.  Follow DASH diet, exercise and weight loss. -Follow-up in 3 months  Diabetes mellitus: Uncontrolled.  Last A1c 8.1% -Continue Janumet, Ozempic and Lantus 66 units at bedtime. -Advised carb consistent diet, exercise and weight loss. -Follow-up in 3 months.  Will repeat A1c on next visit.  Mild OSA: Not on CPAP at home -Encouraged exercise and weight loss  Obesity with BMI of 32: Encourage dietary modification, exercise and weight loss.  No orders of the defined types were placed in this encounter.   Follow-up: Return in about 3 months (around 06/09/2020).    Mckinley Jewel, MD

## 2020-03-22 DIAGNOSIS — Z6825 Body mass index (BMI) 25.0-25.9, adult: Secondary | ICD-10-CM | POA: Diagnosis not present

## 2020-03-22 DIAGNOSIS — Z01419 Encounter for gynecological examination (general) (routine) without abnormal findings: Secondary | ICD-10-CM | POA: Diagnosis not present

## 2020-03-22 DIAGNOSIS — S3141XA Laceration without foreign body of vagina and vulva, initial encounter: Secondary | ICD-10-CM | POA: Diagnosis not present

## 2020-03-22 DIAGNOSIS — Z803 Family history of malignant neoplasm of breast: Secondary | ICD-10-CM | POA: Diagnosis not present

## 2020-04-01 ENCOUNTER — Other Ambulatory Visit: Payer: Self-pay | Admitting: Family Medicine

## 2020-05-08 ENCOUNTER — Other Ambulatory Visit: Payer: Self-pay | Admitting: Family Medicine

## 2020-05-08 ENCOUNTER — Other Ambulatory Visit: Payer: Self-pay

## 2020-05-08 ENCOUNTER — Ambulatory Visit (HOSPITAL_COMMUNITY)
Admission: RE | Admit: 2020-05-08 | Discharge: 2020-05-08 | Disposition: A | Discharge status: Home / Self Care | Payer: Medicaid Other | Source: Ambulatory Visit | Attending: Family Medicine | Admitting: Family Medicine

## 2020-05-08 DIAGNOSIS — Z1231 Encounter for screening mammogram for malignant neoplasm of breast: Secondary | ICD-10-CM | POA: Insufficient documentation

## 2020-05-10 ENCOUNTER — Ambulatory Visit (INDEPENDENT_AMBULATORY_CARE_PROVIDER_SITE_OTHER): Payer: Medicaid Other | Admitting: Internal Medicine

## 2020-05-10 ENCOUNTER — Other Ambulatory Visit: Payer: Self-pay

## 2020-05-10 ENCOUNTER — Encounter: Payer: Self-pay | Admitting: Internal Medicine

## 2020-05-10 VITALS — BP 120/70 | HR 95 | Ht 62.0 in | Wt 178.0 lb

## 2020-05-10 DIAGNOSIS — E669 Obesity, unspecified: Secondary | ICD-10-CM

## 2020-05-10 DIAGNOSIS — E1159 Type 2 diabetes mellitus with other circulatory complications: Secondary | ICD-10-CM

## 2020-05-10 DIAGNOSIS — E782 Mixed hyperlipidemia: Secondary | ICD-10-CM | POA: Diagnosis not present

## 2020-05-10 LAB — POCT GLYCOSYLATED HEMOGLOBIN (HGB A1C): Hemoglobin A1C: 8.1 % — AB (ref 4.0–5.6)

## 2020-05-10 MED ORDER — LANTUS SOLOSTAR 100 UNIT/ML ~~LOC~~ SOPN
PEN_INJECTOR | SUBCUTANEOUS | 3 refills | Status: DC
Start: 2020-05-10 — End: 2020-10-23

## 2020-05-10 NOTE — Patient Instructions (Addendum)
Please continue: - Ozempic 1 mg weekly - JanuMet 50-1000 1x a day in am - Glipizide 5 mg 2x a day 30 minutes before meals - Farxiga 10 mg before b'fast  Please split: - Lantus 30 units in am and 40 at bedtime  Please return in 3 months with your sugar log.

## 2020-05-10 NOTE — Progress Notes (Signed)
Patient ID: [name], female   DOB: 05-Dec-1975, 44 y.o.   MRN: [id number]   This visit occurred during the SARS-CoV-2 public health emergency.  Safety protocols were in place, including screening questions prior to the visit, additional usage of staff PPE, and extensive cleaning of exam room while observing appropriate contact time as indicated for disinfecting solutions.    HPI: [name] is a 44 y.o.-year-old female, returning for f/u for DM2, dx in 2005, insulin-dependent since 2018, uncontrolled, without long-term complications.  She previously saw Dr. Dorris Fetch. First visit with me 02/2018.  Last visit 4 months ago.  Reviewed HbA1c levels: Lab Results  Component Value Date   HGBA1C 8.1 (A) 01/04/2020   HGBA1C 8.2 (H) 07/14/2019   HGBA1C 8.6 (A) 01/05/2019   She is currently on:  - Farxiga 5 mg daily in am - started 12/2018 >> 10 mg daily in a.m. - JanuMet 50-1000 x 2 a day - restarted 03/2019, as otherwise she could not tolerate Metformin >> 50-1000 mg 1x a day in am - Ozempic - started 02/2018 >> 1 mg weekly - Glipizide 5 mg before breakfast and dinner-added 08/2019 -moved 30 minutes before meals - Lantus 60 >> 66 units at bedtime Also, she was on Metformin ER 1000 mg twice a day with meals - started 05/2018 >> GI intolerance She stopped [place] b/c increased urination. She stopped Invokana b/c yeast inf's.  Pt checks her sugars once a day: - am: 117-160, 187, 201-228 after coronavs vaccine >> 110-157, 201 - 2h after b'fast: 150-210 >> n/c >> 170-215 >> 167-193 >> 144-189, 200 - before lunch: 118, 139 >> n/c >> 168 >> 150-193 - 2h after lunch: 174-200 >> n/c >> 168-204 >> 166-189 - before dinner: 102-130, 151 >> n/c >> 160-170 >> 154-189 - 2h after dinner: 150 >> 165 >> n/c >> 165-202 >>  184, 203 - bedtime: 170-190 >> 170 >> n/c >> 90-148 - nighttime: n/c Lowest sugar was 69 - pm ...>> 117 >> 90; she has hypoglycemia awareness in the 70s. Highest sugar was 365  >> 225 >> 220 >> 203 in last 2 mo.  Continues to walk for exercise.  Glucometer: Truemetrix   Pt's meals are: - Breakfast: cereal + milk, muffin + fruit - Lunch: salad, sandwich, hamburger - Dinner: meat + veggies  - Snacks: fruit, crackers  -No CKD, last BUN/creatinine:  Lab Results  Component Value Date   BUN 12 01/13/2020   BUN 16 07/02/2019   CREATININE 0.90 01/13/2020   CREATININE 0.92 07/02/2019  Allergic to ACE inhibitors.  + Hyperlipidemia; last set of lipids: Lab Results  Component Value Date   CHOL 139 01/13/2020   HDL 42 (L) 01/13/2020   LDLCALC 81 01/13/2020   TRIG 77 01/13/2020   CHOLHDL 3.3 01/13/2020  On pravastatin 10.  - last eye exam was in 10/2019: No DR reportedly  - no numbness and tingling in her feet.  Pt has FH of DM in M.  ROS: Constitutional: no weight gain/no weight loss, no fatigue, no subjective hyperthermia, no subjective hypothermia Eyes: no blurry vision, no xerophthalmia ENT: no sore throat, no nodules palpated in neck, no dysphagia, no odynophagia, no hoarseness Cardiovascular: no CP/no SOB/no palpitations/no leg swelling Respiratory: no cough/no SOB/no wheezing Gastrointestinal: no N/no V/no D/no C/no acid reflux Musculoskeletal: no muscle aches/no joint aches Skin: no rashes, no hair loss Neurological: no tremors/no numbness/no tingling/no dizziness  I reviewed pt's medications, allergies, PMH, social hx, family hx, and  changes were documented in the history of present illness. Otherwise, unchanged from my initial visit note.  Past Medical History:  Diagnosis Date  . Constipation   . GERD (gastroesophageal reflux disease)   . Hypertension   . Morbid obesity ([place])   . NIDDM (non-insulin dependent diabetes mellitus) 2005   Type II  . OSA (obstructive sleep apnea) 06/24/2018   Past Surgical History:  Procedure Laterality Date  . BACK SURGERY  05/25/2010   Dr,Kabell   . CESAREAN SECTION  6/09, 2015  . LUMBAR  LAMINECTOMY/DECOMPRESSION MICRODISCECTOMY Left 09/13/2015   Procedure: Left Lumbar five-Sacral one microdiskectomy;  Surgeon: Ashok Pall, MD;  Location: [facility];  Service: Neurosurgery;  Laterality: Left;  Left Lumbar five-Sacral one microdiskectomy   Social History   Socioeconomic History  . Marital status: Married    Spouse name: Not on file  . Number of children: 2  . Years of education: Not on file  . Highest education level: Not on file  Occupational History  . Occupation: unemployed   Tobacco Use  . Smoking status: Never Smoker  . Smokeless tobacco: Never Used  Substance and Sexual Activity  . Alcohol use: No  . Drug use: No   Current Outpatient Medications on File Prior to Visit  Medication Sig Dispense Refill  . Accu-Chek FastClix Lancets MISC TO check blood glucose Three times DAILY 102 each 6  . Blood Glucose Monitoring Suppl (ACCU-CHEK GUIDE ME) w/Device KIT USE AS DIRECTED 1 kit 0  . dapagliflozin propanediol (FARXIGA) 10 MG TABS tablet Take 10 mg by mouth daily before breakfast. 90 tablet 3  . glipiZIDE (GLUCOTROL) 5 MG tablet Take 1 tablet (5 mg total) by mouth 2 (two) times daily before a meal. 180 tablet 3  . GLOBAL EASE INJECT PEN NEEDLES 31G X 8 MM MISC USE WITH LANTUS 100 each 3  . glucose blood (ACCU-CHEK GUIDE) test strip USE TO TEST BLOOD SUGAR THREE TIMES DAILY 100 strip 0  . hydrochlorothiazide (HYDRODIURIL) 25 MG tablet TAKE 1 TABLET BY MOUTH EVERY DAY 30 tablet 2  . insulin glargine (LANTUS SOLOSTAR) 100 UNIT/ML Solostar Pen INJECT 66 UNITS INTO THE SKIN DAILY AT 10 IN THE EVENING 45 mL 3  . pravastatin (PRAVACHOL) 10 MG tablet TAKE 1 TABLET BY MOUTH AT BEDTIME. 90 tablet 1  . Semaglutide, 1 MG/DOSE, (OZEMPIC, 1 MG/DOSE,) 2 MG/1.5ML SOPN INJECT ONE MG INTO THE SKIN ONCE A WEEK 6 mL 3  . sitaGLIPtin-metformin (JANUMET) 50-1000 MG tablet TAKE 1 TABLET BY MOUTH TWICE DAILY WITH A MEAL 60 tablet 3   No current facility-administered medications on file  prior to visit.   Allergies  Allergen Reactions  . Ace Inhibitors Cough  . Amlodipine Besylate     REACTION: severe edema of legs   Family History  Problem Relation Age of Onset  . Diabetes Father   . Hypertension Father   . Kidney disease Father   . Kidney failure Father   . Diabetes Mother   . Hypertension Mother     PE: BP 120/70   Pulse 95   Ht '5\' 2"'  (1.575 m)   Wt 178 lb (80.7 kg)   SpO2 98%   BMI 32.56 kg/m  Wt Readings from Last 3 Encounters:  05/10/20 178 lb (80.7 kg)  03/09/20 179 lb 0.6 oz (81.2 kg)  02/24/20 183 lb (83 kg)   Constitutional: overweight, in NAD Eyes: PERRLA, EOMI, no exophthalmos ENT: moist mucous membranes, no thyromegaly, no cervical lymphadenopathy Cardiovascular: RRR,  No MRG Respiratory: CTA B Gastrointestinal: abdomen soft, NT, ND, BS+ Musculoskeletal: no deformities, strength intact in all 4 Skin: moist, warm, no rashes Neurological: no tremor with outstretched hands, DTR normal in all 4  ASSESSMENT: 1. DM2, insulin-dependent, uncontrolled, without long-term complications, but with hyperglycemia  2. HL  3. Obesity class I  PLAN:  1. Patient with longstanding, uncontrolled, type 2 diabetes, on a complex medication regimen with basal insulin, weekly GLP-1 receptor agonist, DPP 4 inhibitor, Metformin, sulfonylurea, and SGLT2 inhibitor, with still poor control.  At last visit, HbA1c was only slightly better, at 8.1%.  Of note, she cannot tolerate Metformin by itself so we need to continue this in combination with Januvia.  We did discuss in the past that Ozempic and Januvia are in the same class of medications but for now, we have no option of continuing Metformin other than staying on Janumet.  She would be a good candidate for Glumetza, but no insurance covers this anymore.  At last visit she was telling me that she was taking Januvia in the morning but she could not tolerate it at night.  We continued this.  She was taking glipizide to  close to the meals and I advised her to separated by 30 minutes.  Sugars were still above target at all times of the day at that time, so I advised her to increase Lantus by 10%.  She had no lows. -At today's visit, sugars appear slightly better in the morning, but they are still above target later in the day.  We discussed about her stock of Lantus and she does not feel that it is expired or degraded.  However, she will check the expiration date, and also may be try another pen.  For now, I advised her to try to split the Lantus dose into 2 doses, 30 units in a.m. and 40 units at bedtime.  I think this will be covering her higher blood sugars during the day.  Of note, she has no lows. -We will continue the rest of the regimen otherwise. - I suggested to:  Patient Instructions  Please continue: - Ozempic 1 mg weekly - JanuMet 50-1000 1x a day in am - Glipizide 5 mg 2x a day 30 minutes before meals - Farxiga 10 mg before b'fast  Please split: - Lantus 30 units in am and 40 at bedtime  Please return in 3 months with your sugar log.  - we checked her HbA1c: 8.1% (stable, above target) - advised to check sugars at different times of the day - 1-2x a day, rotating check times - advised for yearly eye exams >> she is UTD - return to clinic in 3-4 months  2. HL -Reviewed latest lipid panel from 01/2020: LDL at goal, as are her triglycerides, but HDL is slightly low: Lab Results  Component Value Date   CHOL 139 01/13/2020   HDL 42 (L) 01/13/2020   LDLCALC 81 01/13/2020   TRIG 77 01/13/2020   CHOLHDL 3.3 01/13/2020  -Continues pravastatin 10 without side effects  3. Obesity class I -She lost 17 pounds right after starting Ozempic,  but weight stabilized afterwards - gained 3 lbs net since last OV -continue SGLT 2 inhibitor and GLP-1 receptor agonist which should also help with weight loss  Philemon Kingdom, MD PhD [facility]

## 2020-05-10 NOTE — Addendum Note (Signed)
Addended by: Cardell Peach I on: 05/10/2020 11:31 AM   Modules accepted: Orders

## 2020-05-19 ENCOUNTER — Other Ambulatory Visit: Payer: Self-pay | Admitting: Family Medicine

## 2020-05-19 DIAGNOSIS — E1159 Type 2 diabetes mellitus with other circulatory complications: Secondary | ICD-10-CM

## 2020-05-22 ENCOUNTER — Other Ambulatory Visit: Payer: Self-pay

## 2020-05-22 DIAGNOSIS — E1159 Type 2 diabetes mellitus with other circulatory complications: Secondary | ICD-10-CM

## 2020-05-22 MED ORDER — UNABLE TO FIND: 0 refills | Status: DC

## 2020-05-30 ENCOUNTER — Ambulatory Visit: Payer: Medicaid Other | Admitting: Family Medicine

## 2020-06-01 ENCOUNTER — Other Ambulatory Visit: Payer: Self-pay | Admitting: Internal Medicine

## 2020-06-01 ENCOUNTER — Other Ambulatory Visit: Payer: Self-pay | Admitting: Family Medicine

## 2020-06-01 MED ORDER — GLOBAL EASE INJECT PEN NEEDLES 31G X 8 MM MISC: 0 refills | Status: DC

## 2020-06-01 NOTE — Addendum Note (Signed)
Addended by: Ena Dawley on: 06/01/2020 08:37 AM   Modules accepted: Orders

## 2020-06-08 ENCOUNTER — Other Ambulatory Visit: Payer: Self-pay

## 2020-06-08 ENCOUNTER — Ambulatory Visit (INDEPENDENT_AMBULATORY_CARE_PROVIDER_SITE_OTHER): Payer: Medicaid Other | Admitting: Family Medicine

## 2020-06-08 ENCOUNTER — Encounter: Payer: Self-pay | Admitting: Family Medicine

## 2020-06-08 VITALS — BP 115/81 | HR 87 | Resp 16 | Ht 62.0 in | Wt 179.0 lb

## 2020-06-08 DIAGNOSIS — Z23 Encounter for immunization: Secondary | ICD-10-CM

## 2020-06-08 DIAGNOSIS — E1159 Type 2 diabetes mellitus with other circulatory complications: Secondary | ICD-10-CM

## 2020-06-08 DIAGNOSIS — E559 Vitamin D deficiency, unspecified: Secondary | ICD-10-CM | POA: Diagnosis not present

## 2020-06-08 DIAGNOSIS — I10 Essential (primary) hypertension: Secondary | ICD-10-CM | POA: Diagnosis not present

## 2020-06-08 DIAGNOSIS — E782 Mixed hyperlipidemia: Secondary | ICD-10-CM | POA: Diagnosis not present

## 2020-06-08 DIAGNOSIS — L918 Other hypertrophic disorders of the skin: Secondary | ICD-10-CM | POA: Diagnosis not present

## 2020-06-08 DIAGNOSIS — E669 Obesity, unspecified: Secondary | ICD-10-CM | POA: Diagnosis not present

## 2020-06-08 DIAGNOSIS — Z1159 Encounter for screening for other viral diseases: Secondary | ICD-10-CM

## 2020-06-08 NOTE — Progress Notes (Signed)
[name]     MRN: [id number]      DOB: 01/01/1976   HPI [name] is here for follow up and re-evaluation of chronic medical conditions, medication management and review of any available recent lab and radiology data.  Preventive health is updated, specifically  Cancer screening and Immunization.   Questions or concerns regarding consultations or procedures which the PT has had in the interim are  Addressed.blood sugar is still uncontrolled, has been using expired insulin The PT denies any adverse reactions to current medications since the last visit.  C/o skin tags enlarging in size, wants them removed Denies polyuria, polydipsia, blurred vision , or hypoglycemic episodes.  ROS Denies recent fever or chills. Denies sinus pressure, nasal congestion, ear pain or sore throat. Denies chest congestion, productive cough or wheezing. Denies chest pains, palpitations and leg swelling Denies abdominal pain, nausea, vomiting,diarrhea or constipation.   Denies dysuria, frequency, hesitancy or incontinence. Denies joint pain, swelling and limitation in mobility.No recent f;lare of back pain Denies headaches, seizures, numbness, or tingling. Denies depression, anxiety or insomnia.  PE  BP 115/81   Pulse 87   Resp 16   Ht 5\' 2"  (1.575 m)   Wt 179 lb (81.2 kg)   SpO2 98%   BMI 32.74 kg/m   Patient alert and oriented and in no cardiopulmonary distress.  HEENT: No facial asymmetry, EOMI,     Neck supple .  Chest: Clear to auscultation bilaterally.  CVS: S1, S2 no murmurs, no S3.Regular rate.  ABD: Soft non tender.   Ext: No edema  MS: Adequate ROM spine, shoulders, hips and knees.  Skin: Intact, no ulcerations or rash noted.  Psych: Good eye contact, normal affect. Memory intact not anxious or depressed appearing.  CNS: CN 2-12 intact, power,  normal throughout.no focal deficits noted.   Assessment & Plan  .Cutaneous skin tags Left inner ear and right lower  abdomen , increasing in size and irritating for over 1 year, wants them removed, refer derm  Essential hypertension Controlled, no change in medication DASH diet and commitment to daily physical activity for a minimum of 30 minutes discussed and encouraged, as a part of hypertension management. The importance of attaining a healthy weight is also discussed.  BP/Weight 06/08/2020 05/10/2020 03/09/2020 02/24/2020 01/11/2020 01/04/2020 02/10/6377  Systolic BP 588 502 774 128 786 767 209  Diastolic BP 81 70 78 80 62 82 74  Wt. (Lbs) 179 178 179.04 183 176 175 177.2  BMI 32.74 32.56 32.75 33.47 32.19 32.01 32.41       Mixed hyperlipidemia Hyperlipidemia:Low fat diet discussed and encouraged.   Lipid Panel  Lab Results  Component Value Date   CHOL 139 01/13/2020   HDL 42 (L) 01/13/2020   LDLCALC 81 01/13/2020   TRIG 77 01/13/2020   CHOLHDL 3.3 01/13/2020     Updated lab needed at/ before next visit.   Obesity (BMI 30.0-34.9)  Patient re-educated about  the importance of commitment to a  minimum of 150 minutes of exercise per week as able.  The importance of healthy food choices with portion control discussed, as well as eating regularly and within a 12 hour window most days. The need to choose "clean , green" food 50 to 75% of the time is discussed, as well as to make water the primary drink and set a goal of 64 ounces water daily.    Weight /BMI 06/08/2020 05/10/2020 03/09/2020  WEIGHT 179 lb 178 lb 179 lb 0.6  oz  HEIGHT 5\' 2"  5\' 2"  5\' 2"   BMI 32.74 kg/m2 32.56 kg/m2 32.75 kg/m2      Type 2 diabetes mellitus with vascular disease ([place]) Uncontrolled , managed by [name] is reminded of the importance of commitment to daily physical activity for 30 minutes or more, as able and the need to limit carbohydrate intake to 30 to 60 grams per meal to help with blood sugar control.   The need to take medication as prescribed, test blood sugar as directed, and to call between  visits if there is a concern that blood sugar is uncontrolled is also discussed.   [name] is reminded of the importance of daily foot exam, annual eye examination, and good blood sugar, blood pressure and cholesterol control.  Diabetic Labs Latest Ref Rng & Units 05/10/2020 01/13/2020 01/04/2020 07/14/2019 07/02/2019  HbA1c 4.0 - 5.6 % 8.1(A) - 8.1(A) 8.2(H) -  Microalbumin mg/dL - 19.6 - - -  Micro/Creat Ratio 0 - 29 mg/g creat - - - - -  Chol <200 mg/dL - 139 - - 157  HDL > OR = 50 mg/dL - 42(L) - - 40(L)  Calc LDL mg/dL (calc) - 81 - - 99  Triglycerides <150 mg/dL - 77 - - 85  Creatinine 0.50 - 1.10 mg/dL - 0.90 - - 0.92   BP/Weight 06/08/2020 05/10/2020 03/09/2020 02/24/2020 01/11/2020 01/04/2020 7/54/4920  Systolic BP 100 712 197 588 325 498 264  Diastolic BP 81 70 78 80 62 82 74  Wt. (Lbs) 179 178 179.04 183 176 175 177.2  BMI 32.74 32.56 32.75 33.47 32.19 32.01 32.41   Foot/eye exam completion dates Latest Ref Rng & Units 01/11/2020 04/27/2018  Eye Exam No Retinopathy - -  Foot exam Order - - -  Foot Form Completion - Done Done

## 2020-06-08 NOTE — Assessment & Plan Note (Signed)
Hyperlipidemia:Low fat diet discussed and encouraged.   Lipid Panel  Lab Results  Component Value Date   CHOL 139 01/13/2020   HDL 42 (L) 01/13/2020   LDLCALC 81 01/13/2020   TRIG 77 01/13/2020   CHOLHDL 3.3 01/13/2020     Updated lab needed at/ before next visit.

## 2020-06-08 NOTE — Assessment & Plan Note (Signed)
Controlled, no change in medication DASH diet and commitment to daily physical activity for a minimum of 30 minutes discussed and encouraged, as a part of hypertension management. The importance of attaining a healthy weight is also discussed.  BP/Weight 06/08/2020 05/10/2020 03/09/2020 02/24/2020 01/11/2020 01/04/2020 7/61/8485  Systolic BP 927 639 432 003 794 446 190  Diastolic BP 81 70 78 80 62 82 74  Wt. (Lbs) 179 178 179.04 183 176 175 177.2  BMI 32.74 32.56 32.75 33.47 32.19 32.01 32.41

## 2020-06-08 NOTE — Patient Instructions (Addendum)
F/u I office with MD end April, call if you need me sooner  Flu vaccine today  Please get fasting lipid, cmp and EGFr, TSH, VIt D and hepatitis C screen last week in December  You are referred to dermatology   It is important that you exercise regularly at least 30 minutes 5 times a week. If you develop chest pain, have severe difficulty breathing, or feel very tired, stop exercising immediately and seek medical attention  Think about what you will eat, plan ahead. Choose " clean, green, fresh or frozen" over canned, processed or packaged foods which are more sugary, salty and fatty. 70 to 75% of food eaten should be vegetables and fruit. Three meals at set times with snacks allowed between meals, but they must be fruit or vegetables. Aim to eat over a 12 hour period , example 7 am to 7 pm, and STOP after  your last meal of the day. Drink water,generally about 64 ounces per day, no other drink is as healthy. Fruit juice is best enjoyed in a healthy way, by EATING the fruit.   Thanks for choosing [facility], we consider it a privelige to serve you. Plan to take covid booster Nov 20 or after

## 2020-06-08 NOTE — Assessment & Plan Note (Signed)
Uncontrolled , managed by Endo [name] is reminded of the importance of commitment to daily physical activity for 30 minutes or more, as able and the need to limit carbohydrate intake to 30 to 60 grams per meal to help with blood sugar control.   The need to take medication as prescribed, test blood sugar as directed, and to call between visits if there is a concern that blood sugar is uncontrolled is also discussed.   [name] is reminded of the importance of daily foot exam, annual eye examination, and good blood sugar, blood pressure and cholesterol control.  Diabetic Labs Latest Ref Rng & Units 05/10/2020 01/13/2020 01/04/2020 07/14/2019 07/02/2019  HbA1c 4.0 - 5.6 % 8.1(A) - 8.1(A) 8.2(H) -  Microalbumin mg/dL - 19.6 - - -  Micro/Creat Ratio 0 - 29 mg/g creat - - - - -  Chol <200 mg/dL - 139 - - 157  HDL > OR = 50 mg/dL - 42(L) - - 40(L)  Calc LDL mg/dL (calc) - 81 - - 99  Triglycerides <150 mg/dL - 77 - - 85  Creatinine 0.50 - 1.10 mg/dL - 0.90 - - 0.92   BP/Weight 06/08/2020 05/10/2020 03/09/2020 02/24/2020 01/11/2020 01/04/2020 7/37/3668  Systolic BP 159 470 761 518 343 735 789  Diastolic BP 81 70 78 80 62 82 74  Wt. (Lbs) 179 178 179.04 183 176 175 177.2  BMI 32.74 32.56 32.75 33.47 32.19 32.01 32.41   Foot/eye exam completion dates Latest Ref Rng & Units 01/11/2020 04/27/2018  Eye Exam No Retinopathy - -  Foot exam Order - - -  Foot Form Completion - Done Done

## 2020-06-08 NOTE — Assessment & Plan Note (Signed)
Left inner ear and right lower abdomen , increasing in size and irritating for over 1 year, wants them removed, refer derm

## 2020-06-08 NOTE — Assessment & Plan Note (Signed)
  Patient re-educated about  the importance of commitment to a  minimum of 150 minutes of exercise per week as able.  The importance of healthy food choices with portion control discussed, as well as eating regularly and within a 12 hour window most days. The need to choose "clean , green" food 50 to 75% of the time is discussed, as well as to make water the primary drink and set a goal of 64 ounces water daily.    Weight /BMI 06/08/2020 05/10/2020 03/09/2020  WEIGHT 179 lb 178 lb 179 lb 0.6 oz  HEIGHT 5\' 2"  5\' 2"  5\' 2"   BMI 32.74 kg/m2 32.56 kg/m2 32.75 kg/m2

## 2020-06-26 ENCOUNTER — Other Ambulatory Visit: Payer: Self-pay | Admitting: Internal Medicine

## 2020-06-26 ENCOUNTER — Encounter: Payer: Self-pay | Admitting: Internal Medicine

## 2020-06-26 MED ORDER — FLUCONAZOLE 150 MG PO TABS: 150.0000 mg | ORAL_TABLET | Freq: Once | ORAL | 1 refills | Status: DC

## 2020-06-28 DIAGNOSIS — L538 Other specified erythematous conditions: Secondary | ICD-10-CM | POA: Diagnosis not present

## 2020-06-28 DIAGNOSIS — L919 Hypertrophic disorder of the skin, unspecified: Secondary | ICD-10-CM | POA: Diagnosis not present

## 2020-06-28 DIAGNOSIS — R238 Other skin changes: Secondary | ICD-10-CM | POA: Diagnosis not present

## 2020-06-28 DIAGNOSIS — D485 Neoplasm of uncertain behavior of skin: Secondary | ICD-10-CM | POA: Diagnosis not present

## 2020-07-02 ENCOUNTER — Other Ambulatory Visit: Payer: Self-pay | Admitting: Internal Medicine

## 2020-07-19 ENCOUNTER — Encounter: Payer: Self-pay | Admitting: Internal Medicine

## 2020-07-26 ENCOUNTER — Encounter: Payer: Self-pay | Admitting: Family Medicine

## 2020-08-01 ENCOUNTER — Other Ambulatory Visit: Payer: Self-pay | Admitting: Internal Medicine

## 2020-08-08 DIAGNOSIS — E669 Obesity, unspecified: Secondary | ICD-10-CM | POA: Diagnosis not present

## 2020-08-08 DIAGNOSIS — E782 Mixed hyperlipidemia: Secondary | ICD-10-CM | POA: Diagnosis not present

## 2020-08-08 DIAGNOSIS — Z1159 Encounter for screening for other viral diseases: Secondary | ICD-10-CM | POA: Diagnosis not present

## 2020-08-08 DIAGNOSIS — E559 Vitamin D deficiency, unspecified: Secondary | ICD-10-CM | POA: Diagnosis not present

## 2020-08-08 DIAGNOSIS — I10 Essential (primary) hypertension: Secondary | ICD-10-CM | POA: Diagnosis not present

## 2020-08-09 LAB — LIPID PANEL
Chol/HDL Ratio: 4.3 ratio (ref 0.0–4.4)
Cholesterol, Total: 149 mg/dL (ref 100–199)
HDL: 35 mg/dL — ABNORMAL LOW (ref >39)
LDL Chol Calc (NIH): 97 mg/dL (ref 0–99)
Triglycerides: 88 mg/dL (ref 0–149)
VLDL Cholesterol Cal: 17 mg/dL (ref 5–40)

## 2020-08-09 LAB — CMP14+EGFR
ALT: 36 IU/L — ABNORMAL HIGH (ref 0–32)
AST: 30 IU/L (ref 0–40)
Albumin/Globulin Ratio: 1.6 (ref 1.2–2.2)
Albumin: 4.2 g/dL (ref 3.8–4.8)
Alkaline Phosphatase: 81 IU/L (ref 44–121)
BUN/Creatinine Ratio: 10 (ref 9–23)
BUN: 9 mg/dL (ref 6–24)
Bilirubin Total: 0.3 mg/dL (ref 0.0–1.2)
CO2: 25 mmol/L (ref 20–29)
Calcium: 8.9 mg/dL (ref 8.7–10.2)
Chloride: 97 mmol/L (ref 96–106)
Creatinine, Ser: 0.94 mg/dL (ref 0.57–1.00)
GFR calc Af Amer: 86 mL/min/{1.73_m2} (ref >59)
GFR calc non Af Amer: 75 mL/min/{1.73_m2} (ref >59)
Globulin, Total: 2.7 g/dL (ref 1.5–4.5)
Glucose: 168 mg/dL — ABNORMAL HIGH (ref 65–99)
Potassium: 3.6 mmol/L (ref 3.5–5.2)
Sodium: 140 mmol/L (ref 134–144)
Total Protein: 6.9 g/dL (ref 6.0–8.5)

## 2020-08-09 LAB — HEPATITIS C ANTIBODY: Hep C Virus Ab: <0.1 s/co ratio (ref 0.0–0.9)

## 2020-08-09 LAB — VITAMIN D 25 HYDROXY (VIT D DEFICIENCY, FRACTURES): Vit D, 25-Hydroxy: 29.8 ng/mL — ABNORMAL LOW (ref 30.0–100.0)

## 2020-08-09 LAB — TSH: TSH: 0.458 u[IU]/mL (ref 0.450–4.500)

## 2020-08-14 ENCOUNTER — Ambulatory Visit: Payer: Medicaid Other | Admitting: Internal Medicine

## 2020-09-01 ENCOUNTER — Other Ambulatory Visit: Payer: Self-pay | Admitting: Family Medicine

## 2020-09-01 ENCOUNTER — Other Ambulatory Visit: Payer: Self-pay | Admitting: Internal Medicine

## 2020-09-01 DIAGNOSIS — E1159 Type 2 diabetes mellitus with other circulatory complications: Secondary | ICD-10-CM

## 2020-09-13 ENCOUNTER — Encounter: Payer: Self-pay | Admitting: Internal Medicine

## 2020-09-13 ENCOUNTER — Other Ambulatory Visit: Payer: Self-pay

## 2020-09-13 ENCOUNTER — Ambulatory Visit (INDEPENDENT_AMBULATORY_CARE_PROVIDER_SITE_OTHER): Payer: Medicaid Other | Admitting: Internal Medicine

## 2020-09-13 VITALS — BP 110/76 | HR 86 | Ht 62.0 in | Wt 175.4 lb

## 2020-09-13 DIAGNOSIS — E669 Obesity, unspecified: Secondary | ICD-10-CM

## 2020-09-13 DIAGNOSIS — E1159 Type 2 diabetes mellitus with other circulatory complications: Secondary | ICD-10-CM | POA: Diagnosis not present

## 2020-09-13 DIAGNOSIS — E782 Mixed hyperlipidemia: Secondary | ICD-10-CM | POA: Diagnosis not present

## 2020-09-13 LAB — POCT GLYCOSYLATED HEMOGLOBIN (HGB A1C): Hemoglobin A1C: 8 % — AB (ref 4.0–5.6)

## 2020-09-13 NOTE — Progress Notes (Signed)
Patient ID: [name], female   DOB: 1976-06-16, 45 y.o.   MRN: [id number]   This visit occurred during the SARS-CoV-2 public health emergency.  Safety protocols were in place, including screening questions prior to the visit, additional usage of staff PPE, and extensive cleaning of exam room while observing appropriate contact time as indicated for disinfecting solutions.   HPI: [name] is a 45 y.o.-year-old female, returning for f/u for DM2, dx in 2005, insulin-dependent since 2018, uncontrolled, without long-term complications.  She previously saw Dr. Dorris Fetch. First visit with me 02/2018.  Last visit 4 mo  ago.  Reviewed HbA1c levels: Lab Results  Component Value Date   HGBA1C 8.1 (A) 05/10/2020   HGBA1C 8.1 (A) 01/04/2020   HGBA1C 8.2 (H) 07/14/2019   She is currently on:  - Farxiga 5 mg daily in am - started 12/2018 >> 10 mg daily in a.m. - >> stopped 2/2 AP/D - Ozempic - started 02/2018 >> 1 mg weekly - Glipizide 5 mg before breakfast and dinner-added 08/2019 -moved 30 minutes before meals - Lantus 60 >> 66 units at bedtime >> 30 units in a.m. and 40 units at bedtime Also, she was on Metformin ER 1000 mg twice a day with meals - started 05/2018 >> GI intolerance She stopped Invokana b/c yeast inf's.  Pt checks her sugars 1x a day: - am: 117-160, 187, 201-228 after coronavs vaccine >> 110-157, 201 >> 120-151, 210 (forgot meds) - 2h after b'fast: 170-215 >> 167-193 >> 144-189, 200 >> 151-181, 199 - before lunch: 118, 139 >> n/c >> 168 >> 150-193 >> 124-184 - 2h after lunch: 174-200 >> n/c >> 168-204 >> 166-189 >> 181-200 - before dinner: 102-130, 151 >> n/c >> 160-170 >> 154-189 >> 134, 177-182 - 2h after dinner: 165 >> n/c >> 165-202 >>  184, 203 >> 178-210 - bedtime: 170-190 >> 170 >> n/c >> 90-148 >> n/c  - nighttime: n/c Lowest sugar was 69 - pm ...>> 117 >> 90 >> 120; she has hypoglycemia awareness in the 70s. Highest sugar was 365 >> 225 >> 220 >> 203 >>  266.  Continues to walk for exercise.  Glucometer: Truemetrix   Pt's meals are: - Breakfast: cereal + milk, muffin + fruit - Lunch: salad, sandwich, hamburger - Dinner: meat + veggies  - Snacks: fruit, crackers  -no CKD, last BUN/creatinine:  Lab Results  Component Value Date   BUN 9 08/08/2020   BUN 12 01/13/2020   CREATININE 0.94 08/08/2020   CREATININE 0.90 01/13/2020  Allergic to ACEI.  + Hyperlipidemia; last set of lipids: Lab Results  Component Value Date   CHOL 149 08/08/2020   HDL 35 (L) 08/08/2020   LDLCALC 97 08/08/2020   TRIG 88 08/08/2020   CHOLHDL 4.3 08/08/2020  On pravastatin 10.  - last eye exam was in 10/2019: No DR reportedly  - no numbness and tingling in her feet.  Pt has FH of DM in M.  ROS: Constitutional: no weight gain/no weight loss, no fatigue, no subjective hyperthermia, no subjective hypothermia Eyes: no blurry vision, no xerophthalmia ENT: no sore throat, no nodules palpated in neck, no dysphagia, no odynophagia, no hoarseness Cardiovascular: no CP/no SOB/no palpitations/no leg swelling Respiratory: no cough/no SOB/no wheezing Gastrointestinal: no N/no V/no D/no C/no acid reflux Musculoskeletal: no muscle aches/no joint aches Skin: no rashes, no hair loss Neurological: no tremors/no numbness/no tingling/no dizziness  I reviewed pt's medications, allergies, PMH, social hx, family hx, and changes were documented  in the history of present illness. Otherwise, unchanged from my initial visit note.  Past Medical History:  Diagnosis Date  . Constipation   . GERD (gastroesophageal reflux disease)   . Hypertension   . Morbid obesity ([place])   . NIDDM (non-insulin dependent diabetes mellitus) 2005   Type II  . OSA (obstructive sleep apnea) 06/24/2018   Past Surgical History:  Procedure Laterality Date  . BACK SURGERY  05/25/2010   Dr,Kabell   . CESAREAN SECTION  6/09, 2015  . LUMBAR LAMINECTOMY/DECOMPRESSION MICRODISCECTOMY Left  09/13/2015   Procedure: Left Lumbar five-Sacral one microdiskectomy;  Surgeon: Ashok Pall, MD;  Location: [facility];  Service: Neurosurgery;  Laterality: Left;  Left Lumbar five-Sacral one microdiskectomy   Social History   Socioeconomic History  . Marital status: Married    Spouse name: Not on file  . Number of children: 2  . Years of education: Not on file  . Highest education level: Not on file  Occupational History  . Occupation: unemployed   Tobacco Use  . Smoking status: Never Smoker  . Smokeless tobacco: Never Used  Substance and Sexual Activity  . Alcohol use: No  . Drug use: No   Current Outpatient Medications on File Prior to Visit  Medication Sig Dispense Refill  . Accu-Chek FastClix Lancets MISC TO check blood glucose Three times DAILY 102 each 6  . Blood Glucose Monitoring Suppl (ACCU-CHEK GUIDE ME) w/Device KIT USE AS DIRECTED 1 kit 0  . FARXIGA 10 MG TABS tablet TAKE 1 TABLET BY MOUTH DAILY BEFORE BREAKFAST - 30 tablet 3  . glipiZIDE (GLUCOTROL) 5 MG tablet TAKE 1 TABLET BY MOUTH TWICE DAILY BEFORE MEALS 60 tablet 3  . GLOBAL EASE INJECT PEN NEEDLES 31G X 8 MM MISC USE WITH LANTUS 100 each 0  . glucose blood (ACCU-CHEK GUIDE) test strip USE AS DIRECTED 100 strip 1  . hydrochlorothiazide (HYDRODIURIL) 25 MG tablet TAKE 1 TABLET BY MOUTH EVERY DAY 30 tablet 2  . insulin glargine (LANTUS SOLOSTAR) 100 UNIT/ML Solostar Pen INJECT 30 UNITS INTO THE SKIN IN AM AND 40 UNITS AT BEDTIME 45 mL 3  . JANUMET 50-1000 MG tablet TAKE 1 TABLET BY MOUTH TWICE DAILY with a meal 60 tablet 3  . OZEMPIC, 1 MG/DOSE, 4 MG/3ML SOPN INJECT ONE MG INTO THE SKIN ONCE A WEEK 6 mL 3  . pravastatin (PRAVACHOL) 10 MG tablet TAKE 1 TABLET BY MOUTH AT BEDTIME. 90 tablet 1  . UNABLE TO FIND Accu chek kit fastclix device 1 each 0   No current facility-administered medications on file prior to visit.   Allergies  Allergen Reactions  . Ace Inhibitors Cough  . Amlodipine Besylate     REACTION:  severe edema of legs   Family History  Problem Relation Age of Onset  . Diabetes Father   . Hypertension Father   . Kidney disease Father   . Kidney failure Father   . Diabetes Mother   . Hypertension Mother     PE: BP 110/76   Pulse 86   Ht '5\' 2"'  (1.575 m)   Wt 175 lb 6.4 oz (79.6 kg)   SpO2 99%   BMI 32.08 kg/m  Wt Readings from Last 3 Encounters:  09/13/20 175 lb 6.4 oz (79.6 kg)  06/08/20 179 lb (81.2 kg)  05/10/20 178 lb (80.7 kg)   Constitutional: overweight, in NAD Eyes: PERRLA, EOMI, no exophthalmos ENT: moist mucous membranes, no thyromegaly, no cervical lymphadenopathy Cardiovascular: RRR, No MRG Respiratory:  CTA B Gastrointestinal: abdomen soft, NT, ND, BS+ Musculoskeletal: no deformities, strength intact in all 4 Skin: moist, warm, no rashes Neurological: no tremor with outstretched hands, DTR normal in all 4  ASSESSMENT: 1. DM2, insulin-dependent, uncontrolled, without long-term complications, but with hyperglycemia  2. HL  3. Obesity class I  PLAN:  1. Patient with longstanding, uncontrolled, type 2 diabetes, on a complex diabetic regimen with Metformin, sulfonylurea, SGLT2 inhibitor, and also long-acting insulin and weekly GLP-1 receptor agonist, with still suboptimal control. Of note, she could not tolerate Metformin by itself so we need to continue this in combination with DPP 4 inhibitor.  She would be a good candidate for Glumetza, but this is not covered by any insurance anymore.  -since last OV, she had an yeast if, possibly from [place] -At last visit, sugars were slightly better in the morning but they were still above target later in the day -  I advised her to split Lantus into 2 doses but did not change the rest of the regimen.  At last visit, I suggested to look at home and see if her insulin was expired.  She did note, and indeed it was expired, so she got the new batch.  -At today's visit, sugars remain mostly above goal, with some occasional  blood sugar 120-130.  There is little variability in the blood sugars, suggestive of insufficient basal insulin.  However, she is using 70 units of Lantus a day, which should be enough.  Upon questioning, her insulin is not expired but she is injecting this correctly: She is using only the right side of her abdomen for injection and only a small portion of skin for all of her injections!  We discussed about the importance of rotating injection sites around the abdomen, but also possibly using her sides and also upper thighs.  Gave her written instructions about correct injection sites. -I did advise her to watch her sugars closely after she starts to inject in another site to watch for hypoglycemia.  For now, I would not suggest to change her regimen. - I suggested to:  Patient Instructions  Please continue: - Ozempic 1 mg weekly - Glipizide 5 mg 2x a day 30 minutes before meals - Farxiga 10 mg before b'fast - Lantus 30 units in am and 40 at bedtime  Please return in 3 months with your sugar log.  - we checked her HbA1c: 8.0% (approx. Stable) - advised to check sugars at different times of the day - 2x a day, rotating check times - advised for yearly eye exams >> she is UTD - return to clinic in 3 months  2. HL -R reviewed latest lipid panel from 07/2020: LDL at goal, HDL low: Lab Results  Component Value Date   CHOL 149 08/08/2020   HDL 35 (L) 08/08/2020   LDLCALC 97 08/08/2020   TRIG 88 08/08/2020   CHOLHDL 4.3 08/08/2020  -Continues pravastatin 10 without side effects  3. Obesity class I -continue SGLT 2 inhibitor and GLP-1 receptor agonist which should also help with weight loss -She lost 17 pounds right after starting Ozempic, but weight stabilized afterwards -She lost 3 pounds before last visit and 3 more since then  Philemon Kingdom, MD PhD [facility] [address] Endocrinology

## 2020-09-13 NOTE — Patient Instructions (Addendum)
Please continue: - Ozempic 1 mg weekly - Glipizide 5 mg 2x a day 30 minutes before meals - Farxiga 10 mg before b'fast - Lantus 30 units in am and 40 at bedtime  Change the Lantus and Ozempic inj sites.  Please return in 3 months with your sugar log.   Insulin Injection Instructions, Using Insulin Pens, Adult There are many different types of insulin. The type of insulin that you take may determine how many injections you give yourself and when you need to give the injections. Supplies needed:  Soap and water.  Your insulin pen.  A new needle.  Alcohol wipes.  A disposal container for sharp items (sharps container), such as an empty plastic bottle with a cover. How to choose a site for injection The body absorbs insulin differently, depending on where the insulin is injected (injection site). It is best to inject insulin into the same body area each time (for example, always in the abdomen), but you should use a different spot in that area for each injection. Do not inject the insulin in the same spot each time. There are five main areas that can be used for injecting. These areas are:  Abdomen. This is the preferred area.  Front of thigh.  Upper, outer side of thigh.  Upper, outer side of arm.  Upper, outer part of buttock.   How to use an insulin pen Get ready 1. Wash your hands with soap and water. If soap and water are not available, use hand sanitizer. 2. Test your blood sugar (glucose) level and write down that number. Follow any instructions from your health care provider about what to do if your blood glucose level is higher or lower than your normal range. 3. Check the expiration date and the type of insulin that is in the pen. 4. If you are using CLEAR insulin, check to see that it is clear and free of clumps. 5. If you are using CLOUDY insulin, mix it by gently rolling the insulin pen between your palms several times. Do not shake the pen. 6. Remove the cap from  the insulin pen. 7. Use an alcohol wipe to clean the rubber tip of the pen. 8. Remove the protective paper tab from the disposable needle. Do not let the needle touch anything. 9. Screw a new, unused needle onto the pen. 10. Remove the outer plastic needle cover. Do not throw away the outer plastic cover yet. ? If the pen uses a special safety needle, leave the inner needle shield in place. ? If the pen does not use a special safety needle, remove the inner plastic cover from the needle. 11. Follow the manufacturer's instructions to prime the insulin pen with the volume of insulin needed. Hold the pen with the needle pointing up, and push the button on the opposite end of the pen until a drop of insulin appears at the needle tip. If no insulin appears, repeat this step. 12. Turn the button (dial) to the number of units of insulin that you will be injecting. Inject the insulin 1. Use an alcohol wipe to clean the site where you will be inserting the needle. Let the site air-dry. 2. Hold the pen in the palm of your writing hand like a pencil. 3. If directed by your health care provider, use your other hand to pinch and hold about 1 inch (2.5 cm) of skin at the injection site. Do not directly touch the cleaned part of the skin. 4. Gently  but quickly, use your writing hand to put the needle straight into the skin. Insert the needle at a 45-degree angle or a 90-degree angle (perpendicular) to the skin, as directed by your health care provider. 5. When the needle is completely inserted into the skin, let go of the skin that you are pinching. 6. Use your thumb or index finger of your writing hand to push the top button of the pen all the way to inject the insulin. Continue to hold the pen in place with your writing hand. 7. Wait 10 seconds, then pull the needle straight out of the skin. This will allow all of the insulin to go from the pen and needle into your body. 8. Carefully put the larger (outer)  plastic cover of the needle back over the needle, then unscrew the capped needle and discard it in a sharps container, such as an empty plastic bottle with a cover. 9. Put the plastic cap back on the insulin pen.   How to throw away supplies  Discard all used needles in a sharps container.  Follow the disposal regulations for the area where you live. Do not use any needle more than one time.  Throw away empty disposable pens in the regular trash. Questions to ask your health care provider  How often should I be taking insulin?  How often should I check my blood glucose?  What amount of insulin should I be taking at each time?  What are the side effects?  What should I do if my blood glucose is too high?  What should I do if my blood glucose is too low?  What should I do if I forget to take my insulin?  What number should I call if I have questions? Where to find more information  American Diabetes Association (ADA): www.diabetes.org  Association of Diabetes Care and Education Specialists (ADCES): www.diabeteseducator.org Summary  Before you give yourself an insulin injection, be sure to wash your hands and test your blood sugar level. Write down that number.  Check the expiration date and the type of insulin that is in the pen. The type of insulin that you take may determine how many injections you give yourself and when you need to give the injections.  It is best to inject insulin into the same body area each time (for example, always in the abdomen), but you should use a different spot in that area for each injection.  Do not use a needle more than one time. This information is not intended to replace advice given to you by your health care provider. Make sure you discuss any questions you have with your health care provider. Document Revised: 09/02/2019 Document Reviewed: 09/02/2019 Elsevier Patient Education  2021 [employer].

## 2020-10-02 ENCOUNTER — Other Ambulatory Visit: Payer: Self-pay | Admitting: Family Medicine

## 2020-10-02 ENCOUNTER — Other Ambulatory Visit: Payer: Self-pay | Admitting: Internal Medicine

## 2020-10-22 ENCOUNTER — Other Ambulatory Visit: Payer: Self-pay | Admitting: Internal Medicine

## 2020-10-23 ENCOUNTER — Other Ambulatory Visit: Payer: Self-pay | Admitting: Internal Medicine

## 2020-10-30 ENCOUNTER — Other Ambulatory Visit: Payer: Self-pay | Admitting: Family Medicine

## 2020-10-30 ENCOUNTER — Other Ambulatory Visit: Payer: Self-pay | Admitting: Internal Medicine

## 2020-10-30 DIAGNOSIS — E1159 Type 2 diabetes mellitus with other circulatory complications: Secondary | ICD-10-CM

## 2020-11-10 ENCOUNTER — Encounter: Payer: Self-pay | Admitting: Internal Medicine

## 2020-11-10 ENCOUNTER — Other Ambulatory Visit: Payer: Self-pay | Admitting: Internal Medicine

## 2020-11-10 MED ORDER — INSULIN PEN NEEDLE 32G X 4 MM MISC: 3 refills | Status: DC

## 2020-11-10 MED ORDER — NOVOLOG FLEXPEN 100 UNIT/ML ~~LOC~~ SOPN: 6.0000 [IU] | PEN_INJECTOR | Freq: Three times a day (TID) | SUBCUTANEOUS | 11 refills | Status: DC

## 2020-11-13 ENCOUNTER — Ambulatory Visit: Payer: Medicaid Other | Admitting: Family Medicine

## 2020-11-13 ENCOUNTER — Encounter: Payer: Self-pay | Admitting: Family Medicine

## 2020-11-13 ENCOUNTER — Other Ambulatory Visit: Payer: Self-pay

## 2020-11-13 VITALS — BP 122/74 | HR 96 | Resp 16 | Ht 62.0 in | Wt 175.0 lb

## 2020-11-13 DIAGNOSIS — I10 Essential (primary) hypertension: Secondary | ICD-10-CM | POA: Diagnosis not present

## 2020-11-13 DIAGNOSIS — E1159 Type 2 diabetes mellitus with other circulatory complications: Secondary | ICD-10-CM

## 2020-11-13 DIAGNOSIS — E669 Obesity, unspecified: Secondary | ICD-10-CM | POA: Diagnosis not present

## 2020-11-13 DIAGNOSIS — M542 Cervicalgia: Secondary | ICD-10-CM

## 2020-11-13 DIAGNOSIS — M79601 Pain in right arm: Secondary | ICD-10-CM

## 2020-11-13 DIAGNOSIS — E782 Mixed hyperlipidemia: Secondary | ICD-10-CM

## 2020-11-13 MED ORDER — GABAPENTIN 100 MG PO CAPS: ORAL_CAPSULE | ORAL | 3 refills | Status: DC

## 2020-11-13 MED ORDER — AZELASTINE HCL 0.1 % NA SOLN: 2.0000 | Freq: Two times a day (BID) | NASAL | 3 refills | Status: DC

## 2020-11-13 MED ORDER — MONTELUKAST SODIUM 10 MG PO TABS: 10.0000 mg | ORAL_TABLET | Freq: Every day | ORAL | 3 refills | Status: DC

## 2020-11-13 NOTE — Patient Instructions (Addendum)
Cancel April follow up, reschedule to 6/8  or  Shortly after  Need microalb, fasting lipid, cmp and EGFR June 4 or after  Arm pain is from neck , I believe , please get X ray today, you may take gabapentin at bedtime if needed  Allergy medications are prescribed  It is important that you exercise regularly at least 30 minutes 5 times a week. If you develop chest pain, have severe difficulty breathing, or feel very tired, stop exercising immediately and seek medical attention   Think about what you will eat, plan ahead. Choose " clean, green, fresh or frozen" over canned, processed or packaged foods which are more sugary, salty and fatty. 70 to 75% of food eaten should be vegetables and fruit. Three meals at set times with snacks allowed between meals, but they must be fruit or vegetables. Aim to eat over a 12 hour period , example 7 am to 7 pm, and STOP after  your last meal of the day. Drink water,generally about 64 ounces per day, no other drink is as healthy. Fruit juice is best enjoyed in a healthy way, by EATING the fruit. Thanks for choosing [facility], we consider it a privelige to serve you.

## 2020-11-14 ENCOUNTER — Ambulatory Visit (HOSPITAL_COMMUNITY)
Admission: RE | Admit: 2020-11-14 | Discharge: 2020-11-14 | Disposition: A | Discharge status: Home / Self Care | Payer: Medicaid Other | Source: Ambulatory Visit | Attending: Family Medicine | Admitting: Family Medicine

## 2020-11-14 DIAGNOSIS — M79601 Pain in right arm: Secondary | ICD-10-CM | POA: Diagnosis not present

## 2020-11-14 DIAGNOSIS — M25511 Pain in right shoulder: Secondary | ICD-10-CM | POA: Diagnosis not present

## 2020-11-14 DIAGNOSIS — M542 Cervicalgia: Secondary | ICD-10-CM

## 2020-11-14 DIAGNOSIS — R200 Anesthesia of skin: Secondary | ICD-10-CM | POA: Diagnosis not present

## 2020-11-14 DIAGNOSIS — M4602 Spinal enthesopathy, cervical region: Secondary | ICD-10-CM | POA: Diagnosis not present

## 2020-11-25 ENCOUNTER — Encounter: Payer: Self-pay | Admitting: Family Medicine

## 2020-11-25 DIAGNOSIS — M542 Cervicalgia: Secondary | ICD-10-CM | POA: Insufficient documentation

## 2020-11-25 DIAGNOSIS — M79601 Pain in right arm: Secondary | ICD-10-CM | POA: Insufficient documentation

## 2020-11-25 NOTE — Assessment & Plan Note (Signed)
Controlled, no change in medication DASH diet and commitment to daily physical activity for a minimum of 30 minutes discussed and encouraged, as a part of hypertension management. The importance of attaining a healthy weight is also discussed.  BP/Weight 11/13/2020 09/13/2020 06/08/2020 05/10/2020 03/09/2020 5/46/5035 11/16/5679  Systolic BP 275 170 017 494 496 759 163  Diastolic BP 74 76 81 70 78 80 62  Wt. (Lbs) 175 175.4 179 178 179.04 183 176  BMI 32.01 32.08 32.74 32.56 32.75 33.47 32.19

## 2020-11-25 NOTE — Assessment & Plan Note (Signed)
3 week history, likely from neck, get X ray and start bedtime gabapentin

## 2020-11-25 NOTE — Assessment & Plan Note (Signed)
  Patient re-educated about  the importance of commitment to a  minimum of 150 minutes of exercise per week as able.  The importance of healthy food choices with portion control discussed, as well as eating regularly and within a 12 hour window most days. The need to choose "clean , green" food 50 to 75% of the time is discussed, as well as to make water the primary drink and set a goal of 64 ounces water daily.    Weight /BMI 11/13/2020 09/13/2020 06/08/2020  WEIGHT 175 lb 175 lb 6.4 oz 179 lb  HEIGHT 5\' 2"  5\' 2"  5\' 2"   BMI 32.01 kg/m2 32.08 kg/m2 32.74 kg/m2

## 2020-11-25 NOTE — Assessment & Plan Note (Signed)
Bedtime gabapentin

## 2020-11-25 NOTE — Assessment & Plan Note (Signed)
Hyperlipidemia:Low fat diet discussed and encouraged.   Lipid Panel  Lab Results  Component Value Date   CHOL 149 08/08/2020   HDL 35 (L) 08/08/2020   LDLCALC 97 08/08/2020   TRIG 88 08/08/2020   CHOLHDL 4.3 08/08/2020   Needs to increase exercise

## 2020-11-25 NOTE — Progress Notes (Signed)
[name]     MRN: [id number]      DOB: 03/02/1976   HPI [name] is here for follow up and re-evaluation of chronic medical conditions, medication management and review of any available recent lab and radiology data.  Preventive health is updated, specifically  Cancer screening and Immunization.   Questions or concerns regarding consultations or procedures which the PT has had in the interim are  addressed. The PT denies any adverse reactions to current medications since the last visit.  3 week h/o uncontrolled aching arm pain from neck, no recent trauma, no weakness or numbness C/o uncontrolled blood sugar, working with Endo   ROS Denies recent fever or chills. Denies sinus pressure, nasal congestion, ear pain or sore throat. Denies chest congestion, productive cough or wheezing. Denies chest pains, palpitations and leg swelling Denies abdominal pain, nausea, vomiting,diarrhea or constipation.   Denies dysuria, frequency, hesitancy or incontinence. Denies headaches, seizures, numbness, or tingling. Denies depression, anxiety or insomnia. Denies skin break down or rash.   PE  BP 122/74   Pulse 96   Resp 16   Ht 5\' 2"  (1.575 m)   Wt 175 lb (79.4 kg)   SpO2 97%   BMI 32.01 kg/m   Patient alert and oriented and in no cardiopulmonary distress.  HEENT: No facial asymmetry, EOMI,     Neck supple .  Chest: Clear to auscultation bilaterally.  CVS: S1, S2 no murmurs, no S3.Regular rate.  ABD: Soft non tender.   Ext: No edema  MS: Adequate ROM spine, shoulders, hips and knees.  Skin: Intact, no ulcerations or rash noted.  Psych: Good eye contact, normal affect. Memory intact not anxious or depressed appearing.  CNS: CN 2-12 intact, power,  normal throughout.no focal deficits noted.   Assessment & Plan  Right arm pain 3 week history, likely from neck, get X ray and start bedtime gabapentin  Neck pain on right side Bedtime gabapentin  Obesity (BMI  30.0-34.9)  Patient re-educated about  the importance of commitment to a  minimum of 150 minutes of exercise per week as able.  The importance of healthy food choices with portion control discussed, as well as eating regularly and within a 12 hour window most days. The need to choose "clean , green" food 50 to 75% of the time is discussed, as well as to make water the primary drink and set a goal of 64 ounces water daily.    Weight /BMI 11/13/2020 09/13/2020 06/08/2020  WEIGHT 175 lb 175 lb 6.4 oz 179 lb  HEIGHT 5\' 2"  5\' 2"  5\' 2"   BMI 32.01 kg/m2 32.08 kg/m2 32.74 kg/m2      Mixed hyperlipidemia Hyperlipidemia:Low fat diet discussed and encouraged.   Lipid Panel  Lab Results  Component Value Date   CHOL 149 08/08/2020   HDL 35 (L) 08/08/2020   LDLCALC 97 08/08/2020   TRIG 88 08/08/2020   CHOLHDL 4.3 08/08/2020   Needs to increase exercise    Essential hypertension Controlled, no change in medication DASH diet and commitment to daily physical activity for a minimum of 30 minutes discussed and encouraged, as a part of hypertension management. The importance of attaining a healthy weight is also discussed.  BP/Weight 11/13/2020 09/13/2020 06/08/2020 05/10/2020 03/09/2020 1/60/1093 09/15/5571  Systolic BP 220 254 270 623 762 831 517  Diastolic BP 74 76 81 70 78 80 62  Wt. (Lbs) 175 175.4 179 178 179.04 183 176  BMI 32.01 32.08 32.74 32.56 32.75  33.47 32.19        

## 2020-11-30 ENCOUNTER — Other Ambulatory Visit: Payer: Self-pay | Admitting: Family Medicine

## 2020-12-05 ENCOUNTER — Ambulatory Visit: Payer: Medicaid Other | Admitting: Family Medicine

## 2020-12-15 ENCOUNTER — Other Ambulatory Visit: Payer: Self-pay

## 2020-12-15 ENCOUNTER — Encounter: Payer: Self-pay | Admitting: Internal Medicine

## 2020-12-15 ENCOUNTER — Ambulatory Visit: Payer: Medicaid Other | Admitting: Internal Medicine

## 2020-12-15 VITALS — BP 150/90 | HR 80 | Ht 62.0 in | Wt 177.0 lb

## 2020-12-15 DIAGNOSIS — E782 Mixed hyperlipidemia: Secondary | ICD-10-CM

## 2020-12-15 DIAGNOSIS — E1159 Type 2 diabetes mellitus with other circulatory complications: Secondary | ICD-10-CM

## 2020-12-15 DIAGNOSIS — E669 Obesity, unspecified: Secondary | ICD-10-CM

## 2020-12-15 LAB — POCT GLYCOSYLATED HEMOGLOBIN (HGB A1C): Hemoglobin A1C: 8.3 % — AB (ref 4.0–5.6)

## 2020-12-15 MED ORDER — OZEMPIC (2 MG/DOSE) 8 MG/3ML ~~LOC~~ SOPN: 2.0000 mg | PEN_INJECTOR | SUBCUTANEOUS | 3 refills | Status: DC

## 2020-12-15 MED ORDER — NOVOLOG FLEXPEN 100 UNIT/ML ~~LOC~~ SOPN: 12.0000 [IU] | PEN_INJECTOR | Freq: Three times a day (TID) | SUBCUTANEOUS | 11 refills | Status: DC

## 2020-12-15 NOTE — Progress Notes (Signed)
Patient ID: [name], female   DOB: [birth year], 45 y.o.   MRN: [id number]   This visit occurred during the SARS-CoV-2 public health emergency.  Safety protocols were in place, including screening questions prior to the visit, additional usage of staff PPE, and extensive cleaning of exam room while observing appropriate contact time as indicated for disinfecting solutions.   HPI: [name] is a 45 y.o.-year-old female, returning for f/u for DM2, dx in 2005, insulin-dependent since 2018, uncontrolled, without long-term complications.  She previously saw Dr. Dorris Fetch. First visit with me 02/2018.  Last visit 3 months ago.  Interim history: Since last visit, she contacted me approximately 1 month ago with high blood sugars and we started NovoLog while stopping glipizide.  Sugars started to improve approximately 2 weeks afterwards. She denies increased urination, blurry vision, nausea.  Reviewed HbA1c levels: Lab Results  Component Value Date   HGBA1C 8.0 (A) 09/13/2020   HGBA1C 8.1 (A) 05/10/2020   HGBA1C 8.1 (A) 01/04/2020   She is currently on:  - Farxiga 5 mg daily in am - started 12/2018 >> 10 mg daily in a.m. - Ozempic - started 02/2018 >> 1 mg weekly - Lantus 60 >> 66 units at bedtime >> 30 units in a.m. and 40 units at bedtime - Novolog 12 units 3x a day before meals - since 11/2020 Also, she was on Metformin ER 1000 mg twice a day with meals - started 05/2018 >> GI intolerance She stopped Invokana b/c yeast inf's. She stopped Janumet due to abdominal pain and diarrhea. Stopped Glipizide when we started Novolog.  Pt checks her sugars 1-2x a day: - am:  110-157, 201 >> 120-151, 210 (forgot meds) >> 136-208 - 2h after b'fast:  144-189, 200 >> 151-181, 199 >> 184-206 - before lunch:  n/c >> 168 >> 150-193 >> 124-184 >> 106-180 - 2h after lunch: n/c >> 168-204 >> 166-189 >> 181-200 >> 171 - before dinner:  160-170 >> 154-189 >> 134, 177-182 >> 110-189 - 2h after  dinner: n/c >> 165-202 >>  184, 203 >> 178-210 >> n/c - bedtime: 170-190 >> 170 >> n/c >> 90-148 >> n/c >> 84 - nighttime: n/c Lowest sugar was 69 - pm ... >> 117 >> 90 >> 120 >> 84; she has hypoglycemia awareness in the 70s. Highest sugar was 365 ... >> 203 >> 266 >> 279.  Continues to walk for exercise.  Glucometer: Truemetrix   Pt's meals are: - Breakfast: cereal + milk, muffin + fruit - Lunch: salad, sandwich, hamburger - Dinner: meat + veggies  - Snacks: fruit, crackers  -no CKD, last BUN/creatinine:  Lab Results  Component Value Date   BUN 9 08/08/2020   BUN 12 01/13/2020   CREATININE 0.94 08/08/2020   CREATININE 0.90 01/13/2020  Allergic to ACEI.  + Hyperlipidemia; last set of lipids: Lab Results  Component Value Date   CHOL 149 08/08/2020   HDL 35 (L) 08/08/2020   LDLCALC 97 08/08/2020   TRIG 88 08/08/2020   CHOLHDL 4.3 08/08/2020  On pravastatin 10.  - last eye exam was in 11/2020: No DR   - no numbness and tingling in her feet.  Pt has FH of DM in M.  ROS: Constitutional: no weight gain/no weight loss, no fatigue, no subjective hyperthermia, no subjective hypothermia Eyes: no blurry vision, no xerophthalmia ENT: no sore throat, no nodules palpated in neck, no dysphagia, no odynophagia, no hoarseness Cardiovascular: no CP/no SOB/no palpitations/no leg swelling Respiratory: no cough/no  SOB/no wheezing Gastrointestinal: no N/no V/no D/no C/no acid reflux Musculoskeletal: no muscle aches/no joint aches Skin: no rashes, no hair loss Neurological: no tremors/no numbness/no tingling/no dizziness  I reviewed pt's medications, allergies, PMH, social hx, family hx, and changes were documented in the history of present illness. Otherwise, unchanged from my initial visit note.  Past Medical History:  Diagnosis Date  . Constipation   . GERD (gastroesophageal reflux disease)   . Hypertension   . Morbid obesity ([place])   . NIDDM (non-insulin dependent diabetes  mellitus) 2005   Type II  . OSA (obstructive sleep apnea) 06/24/2018   Past Surgical History:  Procedure Laterality Date  . BACK SURGERY  05/25/2010   Dr,Kabell   . CESAREAN SECTION  6/09, 2015  . LUMBAR LAMINECTOMY/DECOMPRESSION MICRODISCECTOMY Left 09/13/2015   Procedure: Left Lumbar five-Sacral one microdiskectomy;  Surgeon: Ashok Pall, MD;  Location: [facility];  Service: Neurosurgery;  Laterality: Left;  Left Lumbar five-Sacral one microdiskectomy   Social History   Socioeconomic History  . Marital status: Married    Spouse name: Not on file  . Number of children: 2  . Years of education: Not on file  . Highest education level: Not on file  Occupational History  . Occupation: unemployed   Tobacco Use  . Smoking status: Never Smoker  . Smokeless tobacco: Never Used  Substance and Sexual Activity  . Alcohol use: No  . Drug use: No   Current Outpatient Medications on File Prior to Visit  Medication Sig Dispense Refill  . Accu-Chek FastClix Lancets MISC TO check blood glucose Three times DAILY 102 each 6  . ACCU-CHEK GUIDE test strip USE AS DIRECTED 100 strip 5  . azelastine (ASTELIN) 0.1 % nasal spray Place 2 sprays into both nostrils 2 (two) times daily. Use in each nostril as directed 30 mL 3  . Blood Glucose Monitoring Suppl (ACCU-CHEK GUIDE ME) w/Device KIT USE AS DIRECTED 1 kit 0  . FARXIGA 10 MG TABS tablet TAKE 1 TABLET BY MOUTH DAILY BEFORE BREAKFAST - 30 tablet 3  . gabapentin (NEURONTIN) 100 MG capsule Take one capsule at bedtime as needed, for neck and right arm pain 90 capsule 3  . hydrochlorothiazide (HYDRODIURIL) 25 MG tablet TAKE 1 TABLET BY MOUTH EVERY DAY 30 tablet 1  . insulin aspart (NOVOLOG FLEXPEN) 100 UNIT/ML FlexPen Inject 6-12 Units into the skin 3 (three) times daily before meals. 15 mL 11  . insulin glargine (LANTUS SOLOSTAR) 100 UNIT/ML Solostar Pen INJECT 30 UNITS IN THE AM AND 40 UNITS IN THE PM DAILY 45 mL 0  . Insulin Pen Needle 32G X 4 MM  MISC Use 4x a day 300 each 3  . montelukast (SINGULAIR) 10 MG tablet Take 1 tablet (10 mg total) by mouth at bedtime. 30 tablet 3  . OZEMPIC, 1 MG/DOSE, 4 MG/3ML SOPN INJECT ONE MG INTO THE SKIN ONCE A WEEK 6 mL 3  . pravastatin (PRAVACHOL) 10 MG tablet TAKE 1 TABLET BY MOUTH AT BEDTIME. 30 tablet 1  . UNABLE TO FIND Accu chek kit fastclix device 1 each 0   No current facility-administered medications on file prior to visit.   Allergies  Allergen Reactions  . Ace Inhibitors Cough  . Amlodipine Besylate     REACTION: severe edema of legs   Family History  Problem Relation Age of Onset  . Diabetes Father   . Hypertension Father   . Kidney disease Father   . Kidney failure Father   .  Diabetes Mother   . Hypertension Mother     PE: BP (!) 150/90 (BP Location: Right Arm, Patient Position: Sitting, Cuff Size: Normal)   Pulse 80   Ht _0  (1.575 m)   Wt 177 lb (80.3 kg)   SpO2 98%   BMI 32.37 kg/m  Wt Readings from Last 3 Encounters:  12/15/20 177 lb (80.3 kg)  11/13/20 175 lb (79.4 kg)  09/13/20 175 lb 6.4 oz (79.6 kg)   Constitutional: overweight, in NAD Eyes: PERRLA, EOMI, no exophthalmos ENT: moist mucous membranes, no thyromegaly, no cervical lymphadenopathy Cardiovascular: RRR, No MRG Respiratory: CTA B Gastrointestinal: abdomen soft, NT, ND, BS+ Musculoskeletal: no deformities, strength intact in all 4 Skin: moist, warm, no rashes Neurological: no tremor with outstretched hands, DTR normal in all 4  ASSESSMENT: 1. DM2, insulin-dependent, uncontrolled, without long-term complications, but with hyperglycemia  2. HL  3. Obesity class I  PLAN:  1. Patient with longstanding, uncontrolled type 2 diabetes, on a complex diabetic regimen with metformin,  SGLT2 inhibitor and also basal-bolus insulin regimen and weekly GLP-1 receptor agonist, with still suboptimal control.  She could not tolerate metformin due to abdominal pain and diarrhea.  She would be a good candidate  for Glumetza, but this is not covered by any insurance anymore.  She did have a yeast infection before last visit, likely due to [place].  Previously had yeast infections with Invokana. -At last visit, sugars were still mostly above goal, with some occasional blood sugars at target.  There was little variability in the blood sugars, suggestive of insufficient basal insulin.  However, she was using a high dose of Lantus, 70 units a day, which should be enough.  Upon questioning, her insulin was not expired and she was injecting this incorrectly, mostly using a small portion of skin on her right abdomen for all the injections.  We discussed about the importance of rotating injection sites around the abdomen and possibly also using upper thighs.  Given her written instructions about correct injection sites but did not change her regimen at that time.  HbA1c was 8.0%, stable. -At today's visit, reviewing her detailed logs, the sugars appear to have improved in the last 2 weeks (having started NovoLog 1 month ago).  She takes a fixed dose of NovoLog, 12 units before each meal.  States her blood sugars are variable, I advised her to start wearing this dose between 12 and 15units before each meal, but we also discussed about increasing the Ozempic dose to the newly FDA approved dose of 2 mg weekly.  I am hoping that this is covered by her insurance.  She is now doing the injections correctly so for now, we will continue the same dose of Lantus. - I suggested to:  Patient Instructions  Please increase: - Ozempic to 2 mg weekly  Please continue: - Farxiga 10 mg before b'fast - Lantus 30 units in am and 40 at bedtime  Please change: - Novolog 12-15 units 15 min before meals  Please return in 3 months with your sugar log.  - we checked her HbA1c: 8.3% (higher) - advised to check sugars at different times of the day - 2x a day, rotating check times - advised for yearly eye exams >> she is UTD - return to  clinic in 3 months  2. HL -Reviewed latest lipid panel from 07/2020: LDL at goal, HDL low, triglycerides at goal: Lab Results  Component Value Date   CHOL 149 08/08/2020  HDL 35 (L) 08/08/2020   LDLCALC 97 08/08/2020   TRIG 88 08/08/2020   CHOLHDL 4.3 08/08/2020  -Continues pravastatin 10 mg daily without side effects  3. Obesity class I -continue SGLT 2 inhibitor and GLP-1 receptor agonist which should also help with weight loss.  We will increase the dose of Ozempic now. -She lost 17 pounds right after starting Ozempic, but weight stabilized afterwards -Since last visit,she lost 2 lbs  Philemon Kingdom, MD PhD [facility] Endocrinology

## 2020-12-15 NOTE — Patient Instructions (Addendum)
Please increase: - Ozempic to 2 mg weekly  Please continue: - Farxiga 10 mg before b'fast - Lantus 30 units in am and 40 at bedtime  Please change: - Novolog 12-15 units 15 min before meals  Please return in 3 months with your sugar log.

## 2020-12-26 ENCOUNTER — Encounter: Payer: Self-pay | Admitting: Internal Medicine

## 2020-12-29 NOTE — Telephone Encounter (Signed)
Called and left a second message for Ivin Booty to call back to figure out how to get increased dose approved.

## 2020-12-30 ENCOUNTER — Other Ambulatory Visit: Payer: Self-pay | Admitting: Internal Medicine

## 2021-01-02 ENCOUNTER — Other Ambulatory Visit: Payer: Self-pay | Admitting: Internal Medicine

## 2021-01-02 MED ORDER — OMNIPOD DASH PODS (GEN 4) MISC: 3 refills | Status: DC

## 2021-01-04 ENCOUNTER — Encounter: Payer: Self-pay | Admitting: Internal Medicine

## 2021-01-04 ENCOUNTER — Other Ambulatory Visit: Payer: Self-pay | Admitting: Internal Medicine

## 2021-01-04 DIAGNOSIS — E1159 Type 2 diabetes mellitus with other circulatory complications: Secondary | ICD-10-CM

## 2021-01-09 NOTE — Progress Notes (Signed)
Great! Thank you! C

## 2021-01-09 NOTE — Progress Notes (Signed)
I have scheduled her for next week.

## 2021-01-12 DIAGNOSIS — E1159 Type 2 diabetes mellitus with other circulatory complications: Secondary | ICD-10-CM | POA: Diagnosis not present

## 2021-01-12 DIAGNOSIS — E782 Mixed hyperlipidemia: Secondary | ICD-10-CM | POA: Diagnosis not present

## 2021-01-12 NOTE — Telephone Encounter (Signed)
Emailed [name] with information for her to follow up. Awaiting a response.

## 2021-01-14 ENCOUNTER — Other Ambulatory Visit: Payer: Self-pay | Admitting: Family Medicine

## 2021-01-14 LAB — LIPID PANEL
Chol/HDL Ratio: 3.8 ratio (ref 0.0–4.4)
Cholesterol, Total: 139 mg/dL (ref 100–199)
HDL: 37 mg/dL — ABNORMAL LOW (ref >39)
LDL Chol Calc (NIH): 86 mg/dL (ref 0–99)
Triglycerides: 79 mg/dL (ref 0–149)
VLDL Cholesterol Cal: 16 mg/dL (ref 5–40)

## 2021-01-14 LAB — CMP14+EGFR
ALT: 33 IU/L — ABNORMAL HIGH (ref 0–32)
AST: 23 IU/L (ref 0–40)
Albumin/Globulin Ratio: 1.5 (ref 1.2–2.2)
Albumin: 4.3 g/dL (ref 3.8–4.8)
Alkaline Phosphatase: 98 IU/L (ref 44–121)
BUN/Creatinine Ratio: 14 (ref 9–23)
BUN: 11 mg/dL (ref 6–24)
Bilirubin Total: 0.3 mg/dL (ref 0.0–1.2)
CO2: 28 mmol/L (ref 20–29)
Calcium: 9.5 mg/dL (ref 8.7–10.2)
Chloride: 98 mmol/L (ref 96–106)
Creatinine, Ser: 0.81 mg/dL (ref 0.57–1.00)
Globulin, Total: 2.8 g/dL (ref 1.5–4.5)
Glucose: 98 mg/dL (ref 65–99)
Potassium: 3.3 mmol/L — ABNORMAL LOW (ref 3.5–5.2)
Sodium: 140 mmol/L (ref 134–144)
Total Protein: 7.1 g/dL (ref 6.0–8.5)
eGFR: 92 mL/min/{1.73_m2} (ref >59)

## 2021-01-14 LAB — MICROALBUMIN / CREATININE URINE RATIO
Creatinine, Urine: 220.9 mg/dL
Microalb/Creat Ratio: 23 mg/g creat (ref 0–29)
Microalbumin, Urine: 51.9 ug/mL

## 2021-01-14 MED ORDER — POTASSIUM CHLORIDE CRYS ER 10 MEQ PO TBCR: 10.0000 meq | EXTENDED_RELEASE_TABLET | Freq: Every day | ORAL | 5 refills | Status: DC

## 2021-01-15 ENCOUNTER — Other Ambulatory Visit: Payer: Self-pay

## 2021-01-15 ENCOUNTER — Encounter: Payer: Medicaid Other | Attending: Family Medicine | Admitting: Nutrition

## 2021-01-15 DIAGNOSIS — E1159 Type 2 diabetes mellitus with other circulatory complications: Secondary | ICD-10-CM | POA: Insufficient documentation

## 2021-01-15 NOTE — Progress Notes (Addendum)
Patient was trained on how to use the Dash pump.  Settings were put into the PDM by the patient:  TDD was 120u  Basal rate: 2.1u/hr, ISF: 25,  target: 120 with correction over 120 (patient requested this saying that she gets shakey at 100 blood sugar reading.).  I/C: 1 (patient does not know how to count carbs). She will take 16u AM, 20u acL and acS.  She filled a pod with Novolog insulin and attached this to her left upper outer arm without difficulty.  This was started at 10:15AM.  She did not take her AM dose of Lantus.  FBS today was 189, and now it was 159 per fingerstick.  She did a correction dose.  WE also discussed IOB and she reported good understanding of this. She snacks at HS at least 3-4X/wk and was told to take 2-4 units for this.   She was started on a Dexcom CGM Lot: [id number], exp. 2/23.  She will start using this in 2 hours. This was linked to [place]. After starting this, we discussed the need for carb counting--that this  is a more accurate form of dosing Novolog.  She agreed to learn this.  We had 30 minutes remaining, and so we began this process.  She was given brochure on carb counting, and handouts for 15 gram portions sizes for this. She reported good understanding of this.  She will return in 1 week, and she will practice recognizing carbs on her plate, and estimating portion sizes, but only put in units of insulin at this time. In one week, we will change her settings to count carbs.  She reported good understanding of this.   She had no final questions.  I will call her tonight to see how she is doing.  She was told to call here, if blood sugars drop below 70 or remain above 250.  She agreed to do this, and had no final questions.

## 2021-01-15 NOTE — Patient Instructions (Signed)
Read over resource manual and pump manual Call office if blood sugars drop below 70 or remain above 250.

## 2021-01-16 ENCOUNTER — Telehealth: Payer: Self-pay | Admitting: Nutrition

## 2021-01-16 ENCOUNTER — Ambulatory Visit: Payer: Medicaid Other | Admitting: Family Medicine

## 2021-01-16 ENCOUNTER — Encounter: Payer: Self-pay | Admitting: Internal Medicine

## 2021-01-16 ENCOUNTER — Other Ambulatory Visit: Payer: Self-pay | Admitting: Internal Medicine

## 2021-01-16 MED ORDER — DEXCOM G6 SENSOR MISC: 1.0000 | 3 refills | Status: AC

## 2021-01-16 MED ORDER — DEXCOM G6 TRANSMITTER MISC: 1.0000 | 3 refills | Status: DC

## 2021-01-16 MED ORDER — INSULIN ASPART 100 UNIT/ML IJ SOLN: INTRAMUSCULAR | 3 refills | Status: DC

## 2021-01-16 NOTE — Telephone Encounter (Signed)
Patient reported that she did the boluses correctly for breakfast and lunch today.  Blood sugar before lunch was 122.  She did not want to come in tomorrow for a pod change.  She will keep her appointment with me for next week to review her readings.  She had no final questions.

## 2021-01-16 NOTE — Telephone Encounter (Signed)
Patient reported no difficulty giving bolus acL today.  Blood sugar acS:112.  She reports that the pump told her she needed no insulin for her supper meal.  After some questions, she reports that she forgot to put the carbs (20--units of insulin needed for the meal) into her bolus calculator.   Blood sugar was now 156, 2hr. PcS.  Carbs eaten were 55 grams.  Basal rate was decreased from 2.1 to 1.9.  She will call me in the AM.  We discussed the possible need to reduce her meal time insulins, but she said that blood sugar went over 300 pcL today, but came down to 112 by supper time (6PM)..  She was told to call me tomorrow morning.  I will check her Dexcom sensor readings on line and review at that time.

## 2021-01-23 ENCOUNTER — Other Ambulatory Visit: Payer: Self-pay

## 2021-01-23 ENCOUNTER — Encounter: Payer: Medicaid Other | Admitting: Nutrition

## 2021-01-23 DIAGNOSIS — E1159 Type 2 diabetes mellitus with other circulatory complications: Secondary | ICD-10-CM

## 2021-01-23 NOTE — Progress Notes (Signed)
Patient reports no difficulty with bolusing, wearing pump or answering alerts and alarms.  The Dash and Dexcom were downloaded.  Blood sugars going high after only black coffee in the AM--at least 100 points.  She was told to do a o.5 or 1.0 unit bolus before coffee.  She agreed to do this.  Occasional highs after supper, but not consistent. Average blood sugar reading is 176.  No changes made to pump settings.  We reviewed all topics on pump check list including sick days, high blood sugar protocol, and all alerts and alarms. She had no final questions.  She signed the checklist as understanding all topics with no final questions.

## 2021-01-24 ENCOUNTER — Telehealth: Payer: Self-pay | Admitting: Pharmacy Technician

## 2021-01-24 NOTE — Patient Instructions (Signed)
Call OmniPod help line if questions or problems

## 2021-01-24 NOTE — Telephone Encounter (Addendum)
Patient Advocate Encounter   Received notification from [facility] [place] that prior authorization for [place] is required.   PA submitted on 01/24/2021 Key [id number] SENSOR        [id number] TRANSMITTER (PATIENT WILL USE SMART PHONE FOR RECEIVING)  Status is APPROVED through 07/23/2021  PHARMACY WAS CONTACTED AND THEY PROCESSED BOTH.    [facility] will continue to follow.   Venida Jarvis. Nadara Mustard, CPhT Patient Advocate [facility] Phone: [phone] Fax:  [phone]

## 2021-01-25 NOTE — Progress Notes (Signed)
[name], If she is doing well, this is ok. Thank you! C

## 2021-01-26 ENCOUNTER — Telehealth: Payer: Self-pay | Admitting: Dietician

## 2021-01-26 NOTE — Telephone Encounter (Signed)
Patient called and stated she is unable to set up her new PDM and wishes to have an appointment.  Message forwarded to [facility], CDCES. Antonieta Iba, RD, LDN, CDCES

## 2021-01-29 NOTE — Telephone Encounter (Signed)
Appointment scheduled for 6/21

## 2021-01-30 ENCOUNTER — Encounter: Payer: Medicaid Other | Admitting: Nutrition

## 2021-01-30 ENCOUNTER — Other Ambulatory Visit: Payer: Self-pay

## 2021-01-30 ENCOUNTER — Other Ambulatory Visit: Payer: Self-pay | Admitting: Family Medicine

## 2021-01-30 DIAGNOSIS — E1159 Type 2 diabetes mellitus with other circulatory complications: Secondary | ICD-10-CM

## 2021-01-31 NOTE — Patient Instructions (Signed)
Use old PDM until the current pod expires. Call 800 help line if questions.  Please return the old PDM when pod expires.

## 2021-01-31 NOTE — Progress Notes (Signed)
This patient is here to change out her PDM.  It was not holding a charge for more than 6 hours.  Settings were transferred from her old PDM into the new one.  She was told that she must continue to use the old PDM until her Pod expires.  She reported good understanding of this with no final questions.

## 2021-02-14 ENCOUNTER — Encounter: Payer: Self-pay | Admitting: Internal Medicine

## 2021-02-15 ENCOUNTER — Telehealth: Payer: Self-pay | Admitting: Dietician

## 2021-02-15 ENCOUNTER — Telehealth: Payer: Self-pay | Admitting: Pharmacy Technician

## 2021-02-15 NOTE — Telephone Encounter (Signed)
Returned patient call. Patient uses Dexcom CGM.  She states that she lost 2 sensors as the Dexcom would not pair to the transmitter.  She wants to know if there we had an extra Sensor available.  Advised patient to call Dexcom as they may replace these sensors and help her problem solve the issue.  Will place a sensor at the front desk for patient to pick up.  Antonieta Iba, RD, LDN, CDCES

## 2021-02-15 NOTE — Telephone Encounter (Addendum)
Patient Advocate Encounter   Received notification from [place] that prior authorization for DEXCOM G6 SENSOR is required.   PA submitted on 02/15/2021 Key [id number] Status is NOT NEEDED.  If was too soon to fill, no prior authorization needed.    [facility] will continue to follow.   Venida Jarvis. Nadara Mustard, CPhT Patient Advocate [facility] Phone: [phone] Fax:  [phone]

## 2021-02-26 ENCOUNTER — Ambulatory Visit: Payer: Medicaid Other | Admitting: Family Medicine

## 2021-03-01 ENCOUNTER — Other Ambulatory Visit: Payer: Self-pay | Admitting: Family Medicine

## 2021-03-02 ENCOUNTER — Ambulatory Visit: Payer: Medicaid Other | Admitting: Family Medicine

## 2021-03-02 ENCOUNTER — Encounter: Payer: Self-pay | Admitting: Family Medicine

## 2021-03-02 ENCOUNTER — Other Ambulatory Visit: Payer: Self-pay

## 2021-03-02 VITALS — BP 115/75 | HR 88 | Resp 16 | Ht 62.0 in | Wt 179.8 lb

## 2021-03-02 DIAGNOSIS — E669 Obesity, unspecified: Secondary | ICD-10-CM

## 2021-03-02 DIAGNOSIS — I10 Essential (primary) hypertension: Secondary | ICD-10-CM | POA: Diagnosis not present

## 2021-03-02 DIAGNOSIS — E782 Mixed hyperlipidemia: Secondary | ICD-10-CM | POA: Diagnosis not present

## 2021-03-02 DIAGNOSIS — Z1231 Encounter for screening mammogram for malignant neoplasm of breast: Secondary | ICD-10-CM | POA: Diagnosis not present

## 2021-03-02 DIAGNOSIS — E1159 Type 2 diabetes mellitus with other circulatory complications: Secondary | ICD-10-CM

## 2021-03-02 DIAGNOSIS — Z23 Encounter for immunization: Secondary | ICD-10-CM

## 2021-03-02 NOTE — Patient Instructions (Addendum)
F/U in office in early  December WIT Md, CALL IF YOU NEED ME SOONER  Please schedule mammogram at checkout  Call and schedule of an appointment for your flu vaccine in September.  Pneumonia 23 vaccine in office today.  Please get your second COVID booster at your pharmacy in the next 2 to 4 weeks.  Please check with your pharmacist for the TdaP  vaccine which is  past due if it is available and affordable please get this also.    GOOD FOOT EXAM  It is important that you exercise regularly at least 30 minutes 5 times a week. If you develop chest pain, have severe difficulty breathing, or feel very tired, stop exercising immediately and seek medical attention   Thanks for choosing [facility], we consider it a privelige to serve you.    CBC, TSH, Vit D, chem 7 and eGFR 5 days before f/u

## 2021-03-04 ENCOUNTER — Encounter: Payer: Self-pay | Admitting: Family Medicine

## 2021-03-04 NOTE — Assessment & Plan Note (Signed)
Hyperlipidemia:Low fat diet discussed and encouraged.   Lipid Panel  Lab Results  Component Value Date   CHOL 139 01/12/2021   HDL 37 (L) 01/12/2021   LDLCALC 86 01/12/2021   TRIG 79 01/12/2021   CHOLHDL 3.8 01/12/2021  needs to increase exercise

## 2021-03-04 NOTE — Assessment & Plan Note (Signed)
Not at goal but improving, managed by [name] is reminded of the importance of commitment to daily physical activity for 30 minutes or more, as able and the need to limit carbohydrate intake to 30 to 60 grams per meal to help with blood sugar control.   The need to take medication as prescribed, test blood sugar as directed, and to call between visits if there is a concern that blood sugar is uncontrolled is also discussed.   [name] is reminded of the importance of daily foot exam, annual eye examination, and good blood sugar, blood pressure and cholesterol control.  Diabetic Labs Latest Ref Rng & Units 01/12/2021 12/15/2020 09/13/2020 08/08/2020 05/10/2020  HbA1c 4.0 - 5.6 % - 8.3(A) 8.0(A) - 8.1(A)  Microalbumin mg/dL - - - - -  Micro/Creat Ratio 0 - 29 mg/g creat 23 - - - -  Chol 100 - 199 mg/dL 139 - - 149 -  HDL >39 mg/dL 37(L) - - 35(L) -  Calc LDL 0 - 99 mg/dL 86 - - 97 -  Triglycerides 0 - 149 mg/dL 79 - - 88 -  Creatinine 0.57 - 1.00 mg/dL 0.81 - - 0.94 -   BP/Weight 03/02/2021 12/15/2020 11/13/2020 09/13/2020 06/08/2020 05/10/2020 0000000  Systolic BP [id number]  Diastolic BP 75 90 74 76 81 70 78  Wt. (Lbs) 179.8 177 175 175.4 179 178 179.04  BMI 32.89 32.37 32.01 32.08 32.74 32.56 32.75   Foot/eye exam completion dates Latest Ref Rng & Units 03/02/2021 01/11/2020  Eye Exam No Retinopathy - -  Foot exam Order - - -  Foot Form Completion - Done Done

## 2021-03-04 NOTE — Progress Notes (Signed)
[name]     MRN: [id number]      DOB: April 05, 1976   HPI [name] is here for follow up and re-evaluation of chronic medical conditions, medication management and review of any available recent lab and radiology data.  Preventive health is updated, specifically  Cancer screening and Immunization.   Questions or concerns regarding consultations or procedures which the PT has had in the interim are  addressed. The PT denies any adverse reactions to current medications since the last visit.  There are no new concerns.  There are no specific complaints   ROS Denies recent fever or chills. Denies sinus pressure, nasal congestion, ear pain or sore throat. Denies chest congestion, productive cough or wheezing. Denies chest pains, palpitations and leg swelling Denies abdominal pain, nausea, vomiting,diarrhea or constipation.   Denies dysuria, frequency, hesitancy or incontinence. Denies joint pain, swelling and limitation in mobility. Denies headaches, seizures, numbness, or tingling. Denies depression, anxiety or insomnia. Denies skin break down or rash.   PE  BP 115/75   Pulse 88   Resp 16   Ht '5\' 2"'$  (1.575 m)   Wt 179 lb 12.8 oz (81.6 kg)   SpO2 96%   BMI 32.89 kg/m   Patient alert and oriented and in no cardiopulmonary distress.  HEENT: No facial asymmetry, EOMI,     Neck supple .  Chest: Clear to auscultation bilaterally.  CVS: S1, S2 no murmurs, no S3.Regular rate.  ABD: Soft non tender.   Ext: No edema  MS: Adequate ROM spine, shoulders, hips and knees.  Skin: Intact, no ulcerations or rash noted.  Psych: Good eye contact, normal affect. Memory intact not anxious or depressed appearing.  CNS: CN 2-12 intact, power,  normal throughout.no focal deficits noted.   Assessment & Plan Essential hypertension Controlled, no change in medication DASH diet and commitment to daily physical activity for a minimum of 30 minutes discussed and encouraged, as  a part of hypertension management. The importance of attaining a healthy weight is also discussed.  BP/Weight 03/02/2021 12/15/2020 11/13/2020 09/13/2020 06/08/2020 05/10/2020 0000000  Systolic BP [id number] 123XX123 A999333 AB-123456789 123456 A999333  Diastolic BP 75 90 74 76 81 70 78  Wt. (Lbs) 179.8 177 175 175.4 179 178 179.04  BMI 32.89 32.37 32.01 32.08 32.74 32.56 32.75       Obesity (BMI 30.0-34.9)  Patient re-educated about  the importance of commitment to a  minimum of 150 minutes of exercise per week as able.  The importance of healthy food choices with portion control discussed, as well as eating regularly and within a 12 hour window most days. The need to choose "clean , green" food 50 to 75% of the time is discussed, as well as to make water the primary drink and set a goal of 64 ounces water daily.    Weight /BMI 03/02/2021 12/15/2020 11/13/2020  WEIGHT 179 lb 12.8 oz 177 lb 175 lb  HEIGHT '5\' 2"'$  '5\' 2"'$  '5\' 2"'$   BMI 32.89 kg/m2 32.37 kg/m2 32.01 kg/m2      Type 2 diabetes mellitus with vascular disease (HCC) Not at goal but improving, managed by [name] is reminded of the importance of commitment to daily physical activity for 30 minutes or more, as able and the need to limit carbohydrate intake to 30 to 60 grams per meal to help with blood sugar control.   The need to take medication as prescribed, test blood sugar as directed, and to call between  visits if there is a concern that blood sugar is uncontrolled is also discussed.   [name] is reminded of the importance of daily foot exam, annual eye examination, and good blood sugar, blood pressure and cholesterol control.  Diabetic Labs Latest Ref Rng & Units 01/12/2021 12/15/2020 09/13/2020 08/08/2020 05/10/2020  HbA1c 4.0 - 5.6 % - 8.3(A) 8.0(A) - 8.1(A)  Microalbumin mg/dL - - - - -  Micro/Creat Ratio 0 - 29 mg/g creat 23 - - - -  Chol 100 - 199 mg/dL 139 - - 149 -  HDL >39 mg/dL 37(L) - - 35(L) -  Calc LDL 0 - 99 mg/dL 86 - - 97 -   Triglycerides 0 - 149 mg/dL 79 - - 88 -  Creatinine 0.57 - 1.00 mg/dL 0.81 - - 0.94 -   BP/Weight 03/02/2021 12/15/2020 11/13/2020 09/13/2020 06/08/2020 05/10/2020 0000000  Systolic BP [id number] Q000111Q 123XX123 A999333 AB-123456789 123456 A999333  Diastolic BP 75 90 74 76 81 70 78  Wt. (Lbs) 179.8 177 175 175.4 179 178 179.04  BMI 32.89 32.37 32.01 32.08 32.74 32.56 32.75   Foot/eye exam completion dates Latest Ref Rng & Units 03/02/2021 01/11/2020  Eye Exam No Retinopathy - -  Foot exam Order - - -  Foot Form Completion - Done Done        Mixed hyperlipidemia Hyperlipidemia:Low fat diet discussed and encouraged.   Lipid Panel  Lab Results  Component Value Date   CHOL 139 01/12/2021   HDL 37 (L) 01/12/2021   LDLCALC 86 01/12/2021   TRIG 79 01/12/2021   CHOLHDL 3.8 01/12/2021  needs to increase exercise

## 2021-03-04 NOTE — Assessment & Plan Note (Signed)
Controlled, no change in medication DASH diet and commitment to daily physical activity for a minimum of 30 minutes discussed and encouraged, as a part of hypertension management. The importance of attaining a healthy weight is also discussed.  BP/Weight 03/02/2021 12/15/2020 11/13/2020 09/13/2020 06/08/2020 05/10/2020 0000000  Systolic BP [id number] Q000111Q 123XX123 A999333 AB-123456789 123456 A999333  Diastolic BP 75 90 74 76 81 70 78  Wt. (Lbs) 179.8 177 175 175.4 179 178 179.04  BMI 32.89 32.37 32.01 32.08 32.74 32.56 32.75

## 2021-03-04 NOTE — Assessment & Plan Note (Signed)
  Patient re-educated about  the importance of commitment to a  minimum of 150 minutes of exercise per week as able.  The importance of healthy food choices with portion control discussed, as well as eating regularly and within a 12 hour window most days. The need to choose "clean , green" food 50 to 75% of the time is discussed, as well as to make water the primary drink and set a goal of 64 ounces water daily.    Weight /BMI 03/02/2021 12/15/2020 11/13/2020  WEIGHT 179 lb 12.8 oz 177 lb 175 lb  HEIGHT '5\' 2"'$  '5\' 2"'$  '5\' 2"'$   BMI 32.89 kg/m2 32.37 kg/m2 32.01 kg/m2

## 2021-03-05 ENCOUNTER — Encounter: Payer: Self-pay | Admitting: Family Medicine

## 2021-03-11 DIAGNOSIS — H5213 Myopia, bilateral: Secondary | ICD-10-CM | POA: Diagnosis not present

## 2021-03-30 ENCOUNTER — Other Ambulatory Visit: Payer: Self-pay

## 2021-03-30 ENCOUNTER — Ambulatory Visit: Payer: Medicaid Other | Admitting: Internal Medicine

## 2021-03-30 ENCOUNTER — Encounter: Payer: Self-pay | Admitting: Internal Medicine

## 2021-03-30 VITALS — BP 122/86 | HR 88 | Resp 97 | Ht 62.0 in | Wt 179.0 lb

## 2021-03-30 DIAGNOSIS — E1159 Type 2 diabetes mellitus with other circulatory complications: Secondary | ICD-10-CM

## 2021-03-30 DIAGNOSIS — E669 Obesity, unspecified: Secondary | ICD-10-CM

## 2021-03-30 DIAGNOSIS — E782 Mixed hyperlipidemia: Secondary | ICD-10-CM | POA: Diagnosis not present

## 2021-03-30 LAB — POCT GLYCOSYLATED HEMOGLOBIN (HGB A1C): Hemoglobin A1C: 6.6 % — AB (ref 4.0–5.6)

## 2021-03-30 MED ORDER — BAQSIMI ONE PACK 3 MG/DOSE NA POWD: NASAL | 11 refills | Status: DC

## 2021-03-30 NOTE — Patient Instructions (Addendum)
Please continue: - Farxiga 10 mg before b'fast - Ozempic 2 mg weekly  Also: - basal rates: 12 am: 2 units/h - target: 120-120 - ISF: 25 - Insulin on Board: 4h  Please change: - Insulin to Carb ratio to 1:6  Please return in 3-4 months.

## 2021-03-30 NOTE — Progress Notes (Signed)
Patient ID: [name], female   DOB: 1976/01/30, 45 y.o.   MRN: [id number]   This visit occurred during the SARS-CoV-2 public health emergency.  Safety protocols were in place, including screening questions prior to the visit, additional usage of staff PPE, and extensive cleaning of exam room while observing appropriate contact time as indicated for disinfecting solutions.   HPI: [name] is a 45 y.o.-year-old female, returning for f/u for DM2, dx in 2005, insulin-dependent since 2018, uncontrolled, without long-term complications.  She previously saw Dr. Dorris Fetch. First visit with me 02/2018.  Last visit 3.5 months ago.  Interim history: She started on an insulin pump after last visit.  She likes it! No increased urination, blurry vision, nausea, chest pain.  Reviewed HbA1c levels: Lab Results  Component Value Date   HGBA1C 8.3 (A) 12/15/2020   HGBA1C 8.0 (A) 09/13/2020   HGBA1C 8.1 (A) 05/10/2020   She was on:  - Farxiga 5 mg daily in am - started 12/2018 >> 10 mg daily in a.m. - Ozempic - started 02/2018 >> 1 mg >> 2 mg weekly - Lantus 60 >> 66 units at bedtime >> 30 units in a.m. and 40 units at bedtime - Novolog 12 units 3x a day before meals -started 11/2020 >> 12 to 15 units before each meal Also, she was on Metformin ER 1000 mg twice a day with meals - started 05/2018 >> GI intolerance She stopped Invokana b/c yeast inf's. She stopped Janumet due to abdominal pain and diarrhea. Stopped Glipizide when we started Novolog.  We started an Omnipod pump since last OV: Pump settings: - basal rates: 12 am: 2 units/h - ICR: 1:1 - target: 70-120 - ISF: 25 - Insulin on Board: 4h - bolus wizard: on TDD from basal insulin: 48 units (55%) TDD from bolus insulin: 37 units (45%) Total daily dose 82-100 units daily - extended bolusing: not using - changes infusion site: q3 days  Also: - Farxiga 10 mg daily in a.m. - Ozempic 2 mg weekly  Pt checks her sugars 4x a  day with her Dexcom:   Prev.: - am:  110-157, 201 >> 120-151, 210 (forgot meds) >> 136-208 - 2h after b'fast:  144-189, 200 >> 151-181, 199 >> 184-206 - before lunch:  n/c >> 168 >> 150-193 >> 124-184 >> 106-180 - 2h after lunch: n/c >> 168-204 >> 166-189 >> 181-200 >> 171 - before dinner:  160-170 >> 154-189 >> 134, 177-182 >> 110-189 - 2h after dinner: n/c >> 165-202 >>  184, 203 >> 178-210 >> n/c - bedtime: 170-190 >> 170 >> n/c >> 90-148 >> n/c >> 84 - nighttime: n/c Lowest sugar was 69 - pm ... 120 >> 84 >> 70; she has hypoglycemia awareness in the 70s. Highest sugar was 365 ... >> 279 >> 250 - rarely.  Continues to walk for exercise.  Glucometer: Truemetrix   Pt's meals are: - Breakfast: cereal + milk, muffin + fruit - Lunch: salad, sandwich, hamburger - Dinner: meat + veggies  - Snacks: fruit, crackers  -no CKD, last BUN/creatinine:  Lab Results  Component Value Date   BUN 11 01/12/2021   BUN 9 08/08/2020   CREATININE 0.81 01/12/2021   CREATININE 0.94 08/08/2020  Allergic to ACEI.  + Hyperlipidemia; last set of lipids: Lab Results  Component Value Date   CHOL 139 01/12/2021   HDL 37 (L) 01/12/2021   LDLCALC 86 01/12/2021   TRIG 79 01/12/2021   CHOLHDL 3.8 01/12/2021  On  pravastatin 10.  - last eye exam was in 11/2020: No DR   - no numbness and tingling in her feet.  Pt has FH of DM in M.  ROS: Constitutional: no weight gain/no weight loss, no fatigue, no subjective hyperthermia, no subjective hypothermia Eyes: no blurry vision, no xerophthalmia ENT: no sore throat, no nodules palpated in neck, no dysphagia, no odynophagia, no hoarseness Cardiovascular: no CP/no SOB/no palpitations/no leg swelling Respiratory: no cough/no SOB/no wheezing Gastrointestinal: no N/no V/no D/no C/no acid reflux Musculoskeletal: no muscle aches/no joint aches Skin: no rashes, no hair loss Neurological: no tremors/no numbness/no tingling/no dizziness  I reviewed pt's  medications, allergies, PMH, social hx, family hx, and changes were documented in the history of present illness. Otherwise, unchanged from my initial visit note.  Past Medical History:  Diagnosis Date   Constipation    GERD (gastroesophageal reflux disease)    Hypertension    Morbid obesity ([place])    NIDDM (non-insulin dependent diabetes mellitus) 2005   Type II   OSA (obstructive sleep apnea) 06/24/2018   Past Surgical History:  Procedure Laterality Date   BACK SURGERY  05/25/2010   Dr,Kabell    CESAREAN SECTION  6/09, 2015   LUMBAR LAMINECTOMY/DECOMPRESSION MICRODISCECTOMY Left 09/13/2015   Procedure: Left Lumbar five-Sacral one microdiskectomy;  Surgeon: Ashok Pall, MD;  Location: [facility];  Service: Neurosurgery;  Laterality: Left;  Left Lumbar five-Sacral one microdiskectomy   Social History   Socioeconomic History   Marital status: Married    Spouse name: Not on file   Number of children: 2   Years of education: Not on file   Highest education level: Not on file  Occupational History   Occupation: unemployed   Tobacco Use   Smoking status: Never Smoker   Smokeless tobacco: Never Used  Substance and Sexual Activity   Alcohol use: No   Drug use: No   Current Outpatient Medications on File Prior to Visit  Medication Sig Dispense Refill   azelastine (ASTELIN) 0.1 % nasal spray Place 2 sprays into both nostrils 2 (two) times daily. Use in each nostril as directed 30 mL 3   Continuous Blood Gluc Transmit (DEXCOM G6 TRANSMITTER) MISC 1 Device by Does not apply route every 3 (three) months. 1 each 3   FARXIGA 10 MG TABS tablet TAKE 1 TABLET BY MOUTH DAILY BEFORE BREAKFAST - 30 tablet 3   hydrochlorothiazide (HYDRODIURIL) 25 MG tablet TAKE 1 TABLET BY MOUTH EVERY DAY 30 tablet 1   insulin aspart (NOVOLOG) 100 UNIT/ML injection Use up to 110 units a day in the insulin pump. 100 mL 3   Insulin Disposable Pump (OMNIPOD DASH PODS, GEN 4,) MISC Use 1 pod every 2 days 45 each 3    Insulin Pen Needle (GLOBAL EASE INJECT PEN NEEDLES) 31G X 8 MM MISC USE WITH LANTUS TWICE DAILY 200 each 0   montelukast (SINGULAIR) 10 MG tablet TAKE 1 TABLET BY MOUTH AT BEDTIME 30 tablet 3   potassium chloride (KLOR-CON) 10 MEQ tablet Take 1 tablet (10 mEq total) by mouth daily. 30 tablet 5   pravastatin (PRAVACHOL) 10 MG tablet TAKE 1 TABLET BY MOUTH AT BEDTIME 30 tablet 1   Semaglutide, 2 MG/DOSE, (OZEMPIC, 2 MG/DOSE,) 8 MG/3ML SOPN Inject 2 mg into the skin once a week. 9 mL 3   No current facility-administered medications on file prior to visit.   Allergies  Allergen Reactions   Ace Inhibitors Cough   Amlodipine Besylate  REACTION: severe edema of legs   Family History  Problem Relation Age of Onset   Diabetes Father    Hypertension Father    Kidney disease Father    Kidney failure Father    Diabetes Mother    Hypertension Mother     PE: BP 122/86   Pulse 88   Resp (!) 97   Ht '5\' 2"'$  (1.575 m)   Wt 179 lb (81.2 kg)   BMI 32.74 kg/m  Wt Readings from Last 3 Encounters:  03/30/21 179 lb (81.2 kg)  03/02/21 179 lb 12.8 oz (81.6 kg)  12/15/20 177 lb (80.3 kg)   Constitutional: overweight, in NAD Eyes: PERRLA, EOMI, no exophthalmos ENT: moist mucous membranes, no thyromegaly, no cervical lymphadenopathy Cardiovascular: tachycardia, RR, No MRG Respiratory: CTA B Gastrointestinal: abdomen soft, NT, ND, BS+ Musculoskeletal: no deformities, strength intact in all 4 Skin: moist, warm, no rashes Neurological: no tremor with outstretched hands, DTR normal in all 4  ASSESSMENT: 1. DM2, insulin-dependent, uncontrolled, without long-term complications, but with hyperglycemia  2. HL  3. Obesity class I  PLAN:  1. Patient with longstanding, uncontrolled, type 2 diabetes, on a complex diabetic regimen with SGLT2 inhibitor, weekly GLP-1 receptor agonist, and previously on basal-bolus insulin regimen, with still poor control.  Since last visit, we were able to start her  on an OmniPod pump along with the Dexcom CGM.  At last visit, HbA1c was higher, at 8.3%.  At that time, we increased Ozempic to 2 mg daily and I also advised her to vary the dose of NovoLog based on the size of the meal and her sugars before the meal. -In the past she had yeast infections on SGLT2 inhibitors, now tolerated well. -Since last visit, she feels that her sugars improved after starting the insulin pump.  She was off the CGM for the end of last month and her sugars were quite high, but after she started it back, they improved. CGM interpretation: -At today's visit, we reviewed her CGM downloads: It appears that 73.9% of values are in target range (goal >70%), while 26% are higher than 180 (goal <25%), and 0% are lower than 70 (goal <4%).  The calculated average blood sugar is 158.  The projected HbA1c for the next 3 months (GMI) is 7.1%. -Reviewing the CGM trends, it appears that her sugars are mostly in the target range, but she has occasional higher blood sugars after meals and then throughout the afternoon.  Upon review of her CGM and pump data, she is entering few carbs into the pump throughout the day and I believe that this is due to the fact that her insulin to carb ratio is too strict, at 1: 1.  Therefore, for now, we discussed about trying to enter all of the carbs that she is eating into the pump and I will advise her to change her insulin to carb ratio to 1: 6. -Otherwise, I feel that her pump settings are adequate and we will also continue her [place], which she is tolerating well. - I suggested to:  Patient Instructions  Please continue: - Farxiga 10 mg before b'fast - Ozempic 2 mg weekly  Also: - basal rates: 12 am: 2 units/h - target: 120-120 - ISF: 25 - Insulin on Board: 4h  Please change: - Insulin to Carb ratio to 1:6  Please return in 3-4 months.  - we checked her HbA1c: 6.6% (much better) - advised to check sugars at different times of the  day - 4x  a day, rotating check times - advised for yearly eye exams >> she is UTD - return to clinic in 3 months  2. HL -Reviewed latest lipid panel from 01/2021: Fractions at goal, with the exception of a low HDL: Lab Results  Component Value Date   CHOL 139 01/12/2021   HDL 37 (L) 01/12/2021   LDLCALC 86 01/12/2021   TRIG 79 01/12/2021   CHOLHDL 3.8 01/12/2021  -Continues pravastatin 10 mg daily without side effects  3. Obesity class I -continue SGLT 2 inhibitor and GLP-1 receptor agonist which should also help with weight loss -She lost 17 pounds immediately after starting Ozempic -She lost 2 pounds before last visit but gained these back since then  Philemon Kingdom, MD PhD [facility] End[facility]

## 2021-04-01 ENCOUNTER — Other Ambulatory Visit: Payer: Self-pay | Admitting: Family Medicine

## 2021-04-30 ENCOUNTER — Other Ambulatory Visit: Payer: Self-pay | Admitting: Internal Medicine

## 2021-05-02 ENCOUNTER — Other Ambulatory Visit: Payer: Self-pay | Admitting: Internal Medicine

## 2021-05-11 ENCOUNTER — Other Ambulatory Visit: Payer: Self-pay

## 2021-05-11 ENCOUNTER — Ambulatory Visit (HOSPITAL_COMMUNITY)
Admission: RE | Admit: 2021-05-11 | Discharge: 2021-05-11 | Disposition: A | Discharge status: Home / Self Care | Payer: Medicaid Other | Source: Ambulatory Visit | Attending: Family Medicine | Admitting: Family Medicine

## 2021-05-11 DIAGNOSIS — Z1231 Encounter for screening mammogram for malignant neoplasm of breast: Secondary | ICD-10-CM | POA: Insufficient documentation

## 2021-05-17 ENCOUNTER — Other Ambulatory Visit: Payer: Self-pay

## 2021-05-17 ENCOUNTER — Ambulatory Visit (HOSPITAL_COMMUNITY)
Admission: RE | Admit: 2021-05-17 | Discharge: 2021-05-17 | Disposition: A | Discharge status: Home / Self Care | Payer: Medicaid Other | Source: Ambulatory Visit | Attending: Family Medicine | Admitting: Family Medicine

## 2021-05-17 DIAGNOSIS — Z1231 Encounter for screening mammogram for malignant neoplasm of breast: Secondary | ICD-10-CM | POA: Insufficient documentation

## 2021-05-27 ENCOUNTER — Other Ambulatory Visit: Payer: Self-pay | Admitting: Family Medicine

## 2021-06-11 ENCOUNTER — Telehealth: Payer: Self-pay | Admitting: Internal Medicine

## 2021-06-11 NOTE — Telephone Encounter (Signed)
T, we cangive her a sample if we have, until shecan get it from the pharmacy. Ty! C

## 2021-06-11 NOTE — Telephone Encounter (Signed)
Pharmacy called to advise that they do not have the 2MG  [place] asking if RX needs to be updated or different medication needs to be sent.  Please review and advise to [facility] ph# [phone]

## 2021-06-11 NOTE — Telephone Encounter (Signed)
Do you want to provide a sample of 2 MG or send rx for 1 MG?

## 2021-06-12 ENCOUNTER — Other Ambulatory Visit: Payer: Self-pay | Admitting: Internal Medicine

## 2021-06-12 ENCOUNTER — Encounter: Payer: Self-pay | Admitting: Internal Medicine

## 2021-06-12 ENCOUNTER — Telehealth: Payer: Self-pay | Admitting: Pharmacy Technician

## 2021-06-12 ENCOUNTER — Other Ambulatory Visit (HOSPITAL_COMMUNITY): Payer: Self-pay

## 2021-06-12 MED ORDER — TIRZEPATIDE 7.5 MG/0.5ML ~~LOC~~ SOAJ: 7.5000 mg | SUBCUTANEOUS | 3 refills | Status: DC

## 2021-06-12 NOTE — Telephone Encounter (Signed)
Patient Advocate Encounter  Received notification from [place] that prior authorization for [facility] 7.5MG /0.5ML is required.   PA submitted on 11.1.22 Key [id number] Status is pending   [facility] will continue to follow  Luciano Cutter, CPhT Patient Advocate [facility] Phone: [phone] Fax:  [phone]

## 2021-06-12 NOTE — Telephone Encounter (Signed)
Contacted pt and pharmacy was able to fill rx of 2 MG. Sample no longer needed.

## 2021-06-13 ENCOUNTER — Other Ambulatory Visit (HOSPITAL_COMMUNITY): Payer: Self-pay

## 2021-06-13 NOTE — Telephone Encounter (Signed)
Received notification from [place] regarding a prior authorization for [facility] 7.5MG . Authorization has been APPROVED from 11.1.22 to 11.1.23.    Authorization # PA Case: [id number]

## 2021-06-24 ENCOUNTER — Other Ambulatory Visit: Payer: Self-pay | Admitting: Family Medicine

## 2021-06-26 ENCOUNTER — Other Ambulatory Visit (HOSPITAL_COMMUNITY): Payer: Self-pay

## 2021-06-26 ENCOUNTER — Telehealth: Payer: Self-pay | Admitting: Pharmacy Technician

## 2021-06-26 NOTE — Telephone Encounter (Addendum)
Patient Advocate Encounter   Received notification from CoverMyMeds that prior authorization for Dexcom is due for renewal through [facility].   PA submitted on 06/26/21 Key [id number] Status is pending    [facility] will continue to follow:   Armanda Magic, CPhT Patient [facility] Phone: [phone] Fax:  [phone]

## 2021-06-28 NOTE — Telephone Encounter (Signed)
Patient Advocate Encounter  Received notification from [facility] that the request for prior authorization for Dexcom Transmitter has been denied due to not being a benefit on you plan when exceeding quantity limits.     Called and left a message for a Peer to peer review.  This encounter will continue to be updated until final determination.    Armanda Magic, [facility] Patient [facility] Phone: [phone] Fax:  [phone]

## 2021-06-29 ENCOUNTER — Ambulatory Visit: Payer: Medicaid Other | Admitting: Family Medicine

## 2021-06-29 ENCOUNTER — Other Ambulatory Visit: Payer: Self-pay

## 2021-06-29 ENCOUNTER — Encounter: Payer: Self-pay | Admitting: Family Medicine

## 2021-06-29 VITALS — BP 123/83 | HR 88 | Resp 17 | Ht 62.0 in | Wt 179.1 lb

## 2021-06-29 DIAGNOSIS — E669 Obesity, unspecified: Secondary | ICD-10-CM

## 2021-06-29 DIAGNOSIS — E1159 Type 2 diabetes mellitus with other circulatory complications: Secondary | ICD-10-CM

## 2021-06-29 DIAGNOSIS — E782 Mixed hyperlipidemia: Secondary | ICD-10-CM | POA: Diagnosis not present

## 2021-06-29 DIAGNOSIS — Z23 Encounter for immunization: Secondary | ICD-10-CM

## 2021-06-29 DIAGNOSIS — I10 Essential (primary) hypertension: Secondary | ICD-10-CM

## 2021-06-29 DIAGNOSIS — E559 Vitamin D deficiency, unspecified: Secondary | ICD-10-CM

## 2021-06-29 NOTE — Patient Instructions (Signed)
F/U in 6 months, call if you need me sooner  Flu vaccine today  Covid booster as soon as possible  Excellent bP and congrats on improved blood sugar  It is important that you exercise regularly at least 30 minutes 5 times a week. If you develop chest pain, have severe difficulty breathing, or feel very tired, stop exercising immediately and seek medical attention   Fasting CBC, lipid, cmp and EGFr, tSH and vit D end January  Thanks for choosing [facility], we consider it a privelige to serve you.

## 2021-07-01 ENCOUNTER — Encounter: Payer: Self-pay | Admitting: Family Medicine

## 2021-07-01 NOTE — Assessment & Plan Note (Signed)
Hyperlipidemia:Low fat diet discussed and encouraged.   Lipid Panel  Lab Results  Component Value Date   CHOL 139 01/12/2021   HDL 37 (L) 01/12/2021   LDLCALC 86 01/12/2021   TRIG 79 01/12/2021   CHOLHDL 3.8 01/12/2021  Controlled, no change in medication Encouraged to commit to more regular exercise

## 2021-07-01 NOTE — Assessment & Plan Note (Signed)
Controlled, no change in medication DASH diet and commitment to daily physical activity for a minimum of 30 minutes discussed and encouraged, as a part of hypertension management. The importance of attaining a healthy weight is also discussed.  BP/Weight 06/29/2021 03/30/2021 03/02/2021 12/15/2020 11/13/2020 09/13/2020 70/92/9574  Systolic BP 734 037 096 438 381 840 375  Diastolic BP 83 86 75 90 74 76 81  Wt. (Lbs) 179.12 179 179.8 177 175 175.4 179  BMI 32.76 32.74 32.89 32.37 32.01 32.08 32.74

## 2021-07-01 NOTE — Assessment & Plan Note (Signed)
[name] is reminded of the importance of commitment to daily physical activity for 30 minutes or more, as able and the need to limit carbohydrate intake to 30 to 60 grams per meal to help with blood sugar control.   The need to take medication as prescribed, test blood sugar as directed, and to call between visits if there is a concern that blood sugar is uncontrolled is also discussed.   [name] is reminded of the importance of daily foot exam, annual eye examination, and good blood sugar, blood pressure and cholesterol control.  Diabetic Labs Latest Ref Rng & Units 03/30/2021 01/12/2021 12/15/2020 09/13/2020 08/08/2020  HbA1c 4.0 - 5.6 % 6.6(A) - 8.3(A) 8.0(A) -  Microalbumin mg/dL - - - - -  Micro/Creat Ratio 0 - 29 mg/g creat - 23 - - -  Chol 100 - 199 mg/dL - 139 - - 149  HDL >39 mg/dL - 37(L) - - 35(L)  Calc LDL 0 - 99 mg/dL - 86 - - 97  Triglycerides 0 - 149 mg/dL - 79 - - 88  Creatinine 0.57 - 1.00 mg/dL - 0.81 - - 0.94   BP/Weight 06/29/2021 03/30/2021 03/02/2021 12/15/2020 11/13/2020 09/13/2020 51/76/1607  Systolic BP 371 062 694 854 627 035 009  Diastolic BP 83 86 75 90 74 76 81  Wt. (Lbs) 179.12 179 179.8 177 175 175.4 179  BMI 32.76 32.74 32.89 32.37 32.01 32.08 32.74   Foot/eye exam completion dates Latest Ref Rng & Units 03/02/2021 01/11/2020  Eye Exam No Retinopathy - -  Foot exam Order - - -  Foot Form Completion - Done Done      Controlled, no change in medication Managed by Endo

## 2021-07-01 NOTE — Progress Notes (Signed)
[name]     MRN: [id number]      DOB: 1976-08-01   HPI [name] is here for follow up and re-evaluation of chronic medical conditions, medication management and review of any available recent lab and radiology data.  Preventive health is updated, specifically  Cancer screening and Immunization.   Questions or concerns regarding consultations or procedures which the PT has had in the interim are  addressed. The PT denies any adverse reactions to current medications since the last visit.  There are no new concerns.  There are no specific complaints   ROS Denies recent fever or chills. Denies sinus pressure, nasal congestion, ear pain or sore throat. Denies chest congestion, productive cough or wheezing. Denies chest pains, palpitations and leg swelling Denies abdominal pain, nausea, vomiting,diarrhea or constipation.   Denies dysuria, frequency, hesitancy or incontinence. Denies joint pain, swelling and limitation in mobility. Denies headaches, seizures, numbness, or tingling. Denies depression, anxiety or insomnia. Denies skin break down or rash.   PE  BP 123/83   Pulse 88   Resp 17   Ht 5\' 2"  (1.575 m)   Wt 179 lb 1.9 oz (81.2 kg)   SpO2 98%   BMI 32.76 kg/m   Patient alert and oriented and in no cardiopulmonary distress.  HEENT: No facial asymmetry, EOMI,     Neck supple .  Chest: Clear to auscultation bilaterally.  CVS: S1, S2 no murmurs, no S3.Regular rate.  ABD: Soft non tender.   Ext: No edema  MS: Adequate ROM spine, shoulders, hips and knees.  Skin: Intact, no ulcerations or rash noted.  Psych: Good eye contact, normal affect. Memory intact not anxious or depressed appearing.  CNS: CN 2-12 intact, power,  normal throughout.no focal deficits noted.   Assessment & Plan  Essential hypertension Controlled, no change in medication DASH diet and commitment to daily physical activity for a minimum of 30 minutes discussed and encouraged, as  a part of hypertension management. The importance of attaining a healthy weight is also discussed.  BP/Weight 06/29/2021 03/30/2021 03/02/2021 12/15/2020 11/13/2020 09/13/2020 18/29/9371  Systolic BP 696 789 381 017 510 258 527  Diastolic BP 83 86 75 90 74 76 81  Wt. (Lbs) 179.12 179 179.8 177 175 175.4 179  BMI 32.76 32.74 32.89 32.37 32.01 32.08 32.74       Mixed hyperlipidemia Hyperlipidemia:Low fat diet discussed and encouraged.   Lipid Panel  Lab Results  Component Value Date   CHOL 139 01/12/2021   HDL 37 (L) 01/12/2021   LDLCALC 86 01/12/2021   TRIG 79 01/12/2021   CHOLHDL 3.8 01/12/2021  Controlled, no change in medication Encouraged to commit to more regular exercise     Obesity (BMI 30.0-34.9)  Patient re-educated about  the importance of commitment to a  minimum of 150 minutes of exercise per week as able.  The importance of healthy food choices with portion control discussed, as well as eating regularly and within a 12 hour window most days. The need to choose "clean , green" food 50 to 75% of the time is discussed, as well as to make water the primary drink and set a goal of 64 ounces water daily.    Weight /BMI 06/29/2021 03/30/2021 03/02/2021  WEIGHT 179 lb 1.9 oz 179 lb 179 lb 12.8 oz  HEIGHT 5\' 2"  5\' 2"  5\' 2"   BMI 32.76 kg/m2 32.74 kg/m2 32.89 kg/m2      Type 2 diabetes mellitus with vascular disease ([place]) Ms.  Sampath is reminded of the importance of commitment to daily physical activity for 30 minutes or more, as able and the need to limit carbohydrate intake to 30 to 60 grams per meal to help with blood sugar control.   The need to take medication as prescribed, test blood sugar as directed, and to call between visits if there is a concern that blood sugar is uncontrolled is also discussed.   [name] is reminded of the importance of daily foot exam, annual eye examination, and good blood sugar, blood pressure and cholesterol control.  Diabetic Labs  Latest Ref Rng & Units 03/30/2021 01/12/2021 12/15/2020 09/13/2020 08/08/2020  HbA1c 4.0 - 5.6 % 6.6(A) - 8.3(A) 8.0(A) -  Microalbumin mg/dL - - - - -  Micro/Creat Ratio 0 - 29 mg/g creat - 23 - - -  Chol 100 - 199 mg/dL - 139 - - 149  HDL >39 mg/dL - 37(L) - - 35(L)  Calc LDL 0 - 99 mg/dL - 86 - - 97  Triglycerides 0 - 149 mg/dL - 79 - - 88  Creatinine 0.57 - 1.00 mg/dL - 0.81 - - 0.94   BP/Weight 06/29/2021 03/30/2021 03/02/2021 12/15/2020 11/13/2020 09/13/2020 69/62/9528  Systolic BP 413 244 010 272 536 644 034  Diastolic BP 83 86 75 90 74 76 81  Wt. (Lbs) 179.12 179 179.8 177 175 175.4 179  BMI 32.76 32.74 32.89 32.37 32.01 32.08 32.74   Foot/eye exam completion dates Latest Ref Rng & Units 03/02/2021 01/11/2020  Eye Exam No Retinopathy - -  Foot exam Order - - -  Foot Form Completion - Done Done      Controlled, no change in medication Managed by Endo

## 2021-07-01 NOTE — Assessment & Plan Note (Signed)
  Patient re-educated about  the importance of commitment to a  minimum of 150 minutes of exercise per week as able.  The importance of healthy food choices with portion control discussed, as well as eating regularly and within a 12 hour window most days. The need to choose "clean , green" food 50 to 75% of the time is discussed, as well as to make water the primary drink and set a goal of 64 ounces water daily.    Weight /BMI 06/29/2021 03/30/2021 03/02/2021  WEIGHT 179 lb 1.9 oz 179 lb 179 lb 12.8 oz  HEIGHT 5\' 2"  5\' 2"  5\' 2"   BMI 32.76 kg/m2 32.74 kg/m2 32.89 kg/m2

## 2021-07-12 ENCOUNTER — Encounter: Payer: Self-pay | Admitting: Internal Medicine

## 2021-07-12 NOTE — Telephone Encounter (Signed)
Patient has now been rescheduled

## 2021-07-13 ENCOUNTER — Ambulatory Visit: Payer: Medicaid Other | Admitting: Internal Medicine

## 2021-07-17 ENCOUNTER — Ambulatory Visit: Payer: Medicaid Other | Admitting: Family Medicine

## 2021-07-26 ENCOUNTER — Other Ambulatory Visit: Payer: Self-pay | Admitting: Family Medicine

## 2021-07-26 ENCOUNTER — Encounter: Payer: Self-pay | Admitting: Internal Medicine

## 2021-07-26 MED ORDER — TIRZEPATIDE 7.5 MG/0.5ML ~~LOC~~ SOAJ: 7.5000 mg | SUBCUTANEOUS | 2 refills | Status: DC

## 2021-07-28 ENCOUNTER — Other Ambulatory Visit: Payer: Self-pay | Admitting: Internal Medicine

## 2021-08-02 ENCOUNTER — Other Ambulatory Visit (HOSPITAL_COMMUNITY): Payer: Self-pay

## 2021-08-07 ENCOUNTER — Telehealth: Payer: Self-pay | Admitting: Pharmacy Technician

## 2021-08-07 NOTE — Telephone Encounter (Addendum)
Patient Advocate Encounter  Received notification from [place] [facility])  that prior authorization for [name] is required.   PA submitted on 12.27.22 [id number] Status is pending   [facility] will continue to follow  [name], CPhT Patient Advocate [facility] Phone: [phone] Fax:  [phone]

## 2021-08-08 ENCOUNTER — Other Ambulatory Visit (HOSPITAL_COMMUNITY): Payer: Self-pay

## 2021-08-08 NOTE — Telephone Encounter (Signed)
Received notification from [place] ([place]) regarding a prior authorization for [place]. Authorization has been APPROVED from 12.28.22 to 12.28.23.   Per test claim, copay for 30 days supply is $0  Authorization #  PA Case ID: [id number]

## 2021-08-09 ENCOUNTER — Other Ambulatory Visit (HOSPITAL_COMMUNITY): Payer: Self-pay

## 2021-08-09 NOTE — Telephone Encounter (Signed)
Dexcom G6 Receiver/Transmitter/Sensor is now approved with a return co-pay of $0

## 2021-08-10 ENCOUNTER — Other Ambulatory Visit: Payer: Self-pay

## 2021-08-10 ENCOUNTER — Encounter: Payer: Self-pay | Admitting: Internal Medicine

## 2021-08-10 ENCOUNTER — Ambulatory Visit: Payer: Medicaid Other | Admitting: Internal Medicine

## 2021-08-10 VITALS — BP 120/88 | HR 99 | Ht 62.0 in | Wt 180.0 lb

## 2021-08-10 DIAGNOSIS — E669 Obesity, unspecified: Secondary | ICD-10-CM | POA: Diagnosis not present

## 2021-08-10 DIAGNOSIS — E1159 Type 2 diabetes mellitus with other circulatory complications: Secondary | ICD-10-CM

## 2021-08-10 DIAGNOSIS — E782 Mixed hyperlipidemia: Secondary | ICD-10-CM | POA: Diagnosis not present

## 2021-08-10 LAB — POCT GLYCOSYLATED HEMOGLOBIN (HGB A1C): Hemoglobin A1C: 7.2 % — AB (ref 4.0–5.6)

## 2021-08-10 MED ORDER — DEXCOM G6 SENSOR MISC: 1.0000 | 3 refills | Status: AC

## 2021-08-10 MED ORDER — DEXCOM G6 TRANSMITTER MISC: 1.0000 | 3 refills | Status: DC

## 2021-08-10 NOTE — Progress Notes (Signed)
Patient ID: [name], female   DOB: 16-Oct-1975, 45 y.o.   MRN: [id number]   This visit occurred during the SARS-CoV-2 public health emergency.  Safety protocols were in place, including screening questions prior to the visit, additional usage of staff PPE, and extensive cleaning of exam room while observing appropriate contact time as indicated for disinfecting solutions.   HPI: [name] is a 46 y.o.-year-old female, returning for f/u for DM2, dx in 2005, insulin-dependent since 2018, uncontrolled, without long-term complications.  She previously saw Dr. Dorris Fetch. First visit with me 02/2018.  Last visit 3.5 months ago.  Interim history: She started an OmniPod insulin pump before her last visit.  Sugars improved. No increased urination, blurry vision, nausea, chest pain. She was out of [facility] for 1 month due to being on backorder at the pharmacy - just restarted 08/02/2021.  Sugars are higher, but they started to improve in the last few days.  Reviewed HbA1c levels: Lab Results  Component Value Date   HGBA1C 6.6 (A) 03/30/2021   HGBA1C 8.3 (A) 12/15/2020   HGBA1C 8.0 (A) 09/13/2020   She was previously on:  - Farxiga 5 mg daily in am - started 12/2018 >> 10 mg daily in a.m. - Ozempic - started 02/2018 >> 1 mg >> 2 mg weekly - Lantus 60 >> 66 units at bedtime >> 30 units in a.m. and 40 units at bedtime - Novolog 12 units 3x a day before meals -started 11/2020 >> 12 to 15 units before each meal Also, she was on Metformin ER 1000 mg twice a day with meals - started 05/2018 >> GI intolerance She stopped Invokana b/c yeast inf's. She stopped Janumet due to abdominal pain and diarrhea. Stopped Glipizide when we started Novolog.  Insulin pump: OmniPod DASH  CGM: Dexcom G6 - PA just approved 08/08/2021  Insulin: NovoLog  Supplies: Pharmacy  Pump settings: - basal rates: 12 am: 2 units/h - ICR: 1:1 >> 1:6  - target: 70-120 >> 120-120 - ISF: 25 - Insulin on  Board: 4h - bolus wizard: on TDD from basal insulin: 48 units (55%) >> 69% TDD from bolus insulin: 37 units (45%) >> 31% Total daily dose 70-100 units daily - extended bolusing: not using - changes infusion site: q3 days  Also: - Farxiga 10 mg daily in a.m. - Ozempic 2 mg weekly >> Mounjaro 7.5 mg weekly  Pt checks her sugars 4x a day with her Dexcom:     Previously:   Lowest sugar was 69 - pm ... 84 >> 70 >> 80s; she has hypoglycemia awareness in the 70s. Highest sugar was 365 ... >> 279 >> 250 >> >350.  Continues to walk for exercise.  Glucometer: Truemetrix   Pt's meals are: - Breakfast: cereal + milk, muffin + fruit - Lunch: salad, sandwich, hamburger - Dinner: meat + veggies  - Snacks: fruit, crackers  -no CKD, last BUN/creatinine:  Lab Results  Component Value Date   BUN 11 01/12/2021   BUN 9 08/08/2020   CREATININE 0.81 01/12/2021   CREATININE 0.94 08/08/2020  Allergic to ACEI.  + Hyperlipidemia; last set of lipids: Lab Results  Component Value Date   CHOL 139 01/12/2021   HDL 37 (L) 01/12/2021   LDLCALC 86 01/12/2021   TRIG 79 01/12/2021   CHOLHDL 3.8 01/12/2021  On pravastatin 10.  - last eye exam was in 11/2020: No DR   - no numbness and tingling in her feet.  Pt has FH of DM  in [place].  ROS: + see HPI  I reviewed pt's medications, allergies, PMH, social hx, family hx, and changes were documented in the history of present illness. Otherwise, unchanged from my initial visit note.  Past Medical History:  Diagnosis Date   Constipation    GERD (gastroesophageal reflux disease)    Hypertension    Morbid obesity ([place])    NIDDM (non-insulin dependent diabetes mellitus) 2005   Type II   OSA (obstructive sleep apnea) 06/24/2018   Past Surgical History:  Procedure Laterality Date   BACK SURGERY  05/25/2010   Dr,Kabell    CESAREAN SECTION  6/09, 2015   LUMBAR LAMINECTOMY/DECOMPRESSION MICRODISCECTOMY Left 09/13/2015   Procedure: Left Lumbar  five-Sacral one microdiskectomy;  Surgeon: Ashok Pall, MD;  Location: [facility];  Service: Neurosurgery;  Laterality: Left;  Left Lumbar five-Sacral one microdiskectomy   Social History   Socioeconomic History   Marital status: Married    Spouse name: Not on file   Number of children: 2   Years of education: Not on file   Highest education level: Not on file  Occupational History   Occupation: unemployed   Tobacco Use   Smoking status: Never Smoker   Smokeless tobacco: Never Used  Substance and Sexual Activity   Alcohol use: No   Drug use: No   Current Outpatient Medications on File Prior to Visit  Medication Sig Dispense Refill   azelastine (ASTELIN) 0.1 % nasal spray Place 2 sprays into both nostrils 2 (two) times daily. Use in each nostril as directed 30 mL 3   Continuous Blood Gluc Transmit (DEXCOM G6 TRANSMITTER) MISC 1 Device by Does not apply route every 3 (three) months. 1 each 3   FARXIGA 10 MG TABS tablet TAKE 1 TABLET BY MOUTH DAILY BEFORE BREAKFAST 30 tablet 3   Glucagon (BAQSIMI ONE PACK) 3 MG/DOSE POWD Use as needed for hypoglycemia 1 each 11   hydrochlorothiazide (HYDRODIURIL) 25 MG tablet TAKE 1 TABLET BY MOUTH EVERY DAY 30 tablet 1   insulin aspart (NOVOLOG) 100 UNIT/ML injection Use up to 110 units a day in the insulin pump. 100 mL 3   Insulin Disposable Pump (OMNIPOD DASH PODS, GEN 4,) MISC Use 1 pod every 2 days 45 each 3   montelukast (SINGULAIR) 10 MG tablet TAKE 1 TABLET BY MOUTH AT BEDTIME 30 tablet 3   potassium chloride (KLOR-CON) 10 MEQ tablet TAKE 1 TABLET BY MOUTH DAILY 30 tablet 5   pravastatin (PRAVACHOL) 10 MG tablet TAKE 1 TABLET BY MOUTH AT BEDTIME 30 tablet 1   tirzepatide (MOUNJARO) 7.5 MG/0.5ML Pen Inject 7.5 mg into the skin once a week. 2 mL 2   No current facility-administered medications on file prior to visit.   Allergies  Allergen Reactions   Ace Inhibitors Cough   Amlodipine Besylate     REACTION: severe edema of legs   Family  History  Problem Relation Age of Onset   Diabetes Father    Hypertension Father    Kidney disease Father    Kidney failure Father    Diabetes Mother    Hypertension Mother    PE: BP 120/88 (BP Location: Right Arm, Patient Position: Sitting, Cuff Size: Normal)    Pulse 99    Ht 5\' 2"  (1.575 m)    Wt 180 lb (81.6 kg)    SpO2 98%    BMI 32.92 kg/m  Wt Readings from Last 3 Encounters:  08/10/21 180 lb (81.6 kg)  06/29/21 179 lb 1.9  oz (81.2 kg)  03/30/21 179 lb (81.2 kg)   Constitutional: overweight, in NAD Eyes: PERRLA, EOMI, no exophthalmos ENT: moist mucous membranes, no thyromegaly, no cervical lymphadenopathy Cardiovascular: tachycardia, RR, No MRG Respiratory: CTA B Musculoskeletal: no deformities, strength intact in all 4 Skin: moist, warm, no rashes Neurological: no tremor with outstretched hands, DTR normal in all 4  ASSESSMENT: 1. DM2, insulin-dependent, uncontrolled, without long-term complications, but with hyperglycemia  2. HL  3. Obesity class I  PLAN:  1. Patient with longstanding, uncontrolled, type 2 diabetes, on a complex diabetic regimen with an SGLT2 inhibitor, weekly GLP-1 receptor agonist and also an OmniPod insulin pump, started before last visit.  Her sugars improved significantly on this, as seen on the downloads from her CGM at last visit.  In the last few days, she did not have the CGM on due to insurance problems, but the PA was recently approved and today I sent a new prescription to her pharmacy.  Also, she was off Mounjaro for 1 month due to being on backorder.  She was just able to restart it a week ago.  Sugars started to improve. CGM interpretation: -At today's visit, we reviewed her CGM downloads: It appears that 90.5% of values are in target range (goal >70%), while 80.5% are higher than 180 (goal <25%), and 0% are lower than 70 (goal <4%).  The calculated average blood sugar is 229.   -Reviewing the CGM trends, it appears that her sugars are much  worse compared to before.  They are fluctuating in the higher range, usually above 180, with significantly increased blood sugars after breakfast and lunch and persistently high blood sugars after lunch.  This is most likely related to being out of [facility], but also most likely due to the holidays.  Comparing the blood sugar control in the last 2 weeks with the previous 2 weeks, she has been doing much worse during Christmas and after Christmas except for the last few days, in which sugars improved after starting Mounjaro. -Reviewing her pump download, she was not introducing any carbs into the pump and bolusing for lunch and dinner and I strongly advised her to do so. -For now, we will continue the same pump settings and also [place] in [place].  I advised her to let me know if she has problems obtaining this from now on. - I suggested to:  Patient Instructions  Please continue: - Farxiga 10 mg before b'fast - Mounjaro 7.5 mg weekly  Also: - basal rates: 12 am: 2 units/h - target: 120-120 - ICR 1:6 - ISF: 25 - Insulin on Board: 4h  Please return in 3 months.  - we checked her HbA1c: 7.2% (higher) - advised to check sugars at different times of the day - 4x a day, rotating check times - advised for yearly eye exams >> she is UTD - return to clinic in 3-4 months  2. HL -Reviewed latest lipid panel from 01/2021: Fractions at goal with the exception of a low HDL: Lab Results  Component Value Date   CHOL 139 01/12/2021   HDL 37 (L) 01/12/2021   LDLCALC 86 01/12/2021   TRIG 79 01/12/2021   CHOLHDL 3.8 01/12/2021  -Continues pravastatin 10 mg daily without side effects  3. Obesity class I -continue SGLT 2 inhibitor and GLP-1 receptor agonist which should also help with weight loss -She lost 17 pounds immediately after starting Ozempic -Since last visit, she gained 1 pound.  Philemon Kingdom, MD PhD [facility]

## 2021-08-10 NOTE — Patient Instructions (Addendum)
Please continue: - Farxiga 10 mg before b'fast - Mounjaro 7.5 mg weekly  Also: - basal rates: 12 am: 2 units/h - target: 120-120 - ICR 1:6 - ISF: 25 - Insulin on Board: 4h  Please return in 3 months.

## 2021-08-26 ENCOUNTER — Other Ambulatory Visit: Payer: Self-pay | Admitting: Internal Medicine

## 2021-09-21 DIAGNOSIS — I10 Essential (primary) hypertension: Secondary | ICD-10-CM | POA: Diagnosis not present

## 2021-09-21 DIAGNOSIS — E782 Mixed hyperlipidemia: Secondary | ICD-10-CM | POA: Diagnosis not present

## 2021-09-21 DIAGNOSIS — E559 Vitamin D deficiency, unspecified: Secondary | ICD-10-CM | POA: Diagnosis not present

## 2021-09-22 ENCOUNTER — Other Ambulatory Visit: Payer: Self-pay | Admitting: Family Medicine

## 2021-09-22 LAB — LIPID PANEL
Chol/HDL Ratio: 3.1 ratio (ref 0.0–4.4)
Cholesterol, Total: 137 mg/dL (ref 100–199)
HDL: 44 mg/dL (ref >39)
LDL Chol Calc (NIH): 82 mg/dL (ref 0–99)
Triglycerides: 47 mg/dL (ref 0–149)
VLDL Cholesterol Cal: 11 mg/dL (ref 5–40)

## 2021-09-22 LAB — CMP14+EGFR
ALT: 19 IU/L (ref 0–32)
AST: 20 IU/L (ref 0–40)
Albumin/Globulin Ratio: 1.5 (ref 1.2–2.2)
Albumin: 4.1 g/dL (ref 3.8–4.8)
Alkaline Phosphatase: 93 IU/L (ref 44–121)
BUN/Creatinine Ratio: 17 (ref 9–23)
BUN: 17 mg/dL (ref 6–24)
Bilirubin Total: 0.5 mg/dL (ref 0.0–1.2)
CO2: 25 mmol/L (ref 20–29)
Calcium: 9.4 mg/dL (ref 8.7–10.2)
Chloride: 98 mmol/L (ref 96–106)
Creatinine, Ser: 1.03 mg/dL — ABNORMAL HIGH (ref 0.57–1.00)
Globulin, Total: 2.8 g/dL (ref 1.5–4.5)
Glucose: 123 mg/dL — ABNORMAL HIGH (ref 70–99)
Potassium: 3.8 mmol/L (ref 3.5–5.2)
Sodium: 136 mmol/L (ref 134–144)
Total Protein: 6.9 g/dL (ref 6.0–8.5)
eGFR: 69 mL/min/{1.73_m2} (ref >59)

## 2021-09-22 LAB — CBC
Hematocrit: 41.7 % (ref 34.0–46.6)
Hemoglobin: 13.7 g/dL (ref 11.1–15.9)
MCH: 26.2 pg — ABNORMAL LOW (ref 26.6–33.0)
MCHC: 32.9 g/dL (ref 31.5–35.7)
MCV: 80 fL (ref 79–97)
Platelets: 217 10*3/uL (ref 150–450)
RBC: 5.23 x10E6/uL (ref 3.77–5.28)
RDW: 13.1 % (ref 11.7–15.4)
WBC: 9.9 10*3/uL (ref 3.4–10.8)

## 2021-09-22 LAB — VITAMIN D 25 HYDROXY (VIT D DEFICIENCY, FRACTURES): Vit D, 25-Hydroxy: 18.1 ng/mL — ABNORMAL LOW (ref 30.0–100.0)

## 2021-09-22 LAB — TSH: TSH: 0.712 u[IU]/mL (ref 0.450–4.500)

## 2021-09-22 MED ORDER — ERGOCALCIFEROL 1.25 MG (50000 UT) PO CAPS: 50000.0000 [IU] | ORAL_CAPSULE | ORAL | 2 refills | Status: DC

## 2021-09-25 ENCOUNTER — Other Ambulatory Visit: Payer: Self-pay | Admitting: Family Medicine

## 2021-09-26 ENCOUNTER — Encounter: Payer: Self-pay | Admitting: Family Medicine

## 2021-10-08 ENCOUNTER — Encounter: Payer: Self-pay | Admitting: Internal Medicine

## 2021-10-08 DIAGNOSIS — E1159 Type 2 diabetes mellitus with other circulatory complications: Secondary | ICD-10-CM

## 2021-10-09 ENCOUNTER — Other Ambulatory Visit (HOSPITAL_COMMUNITY): Payer: Self-pay

## 2021-10-09 MED ORDER — TIRZEPATIDE 7.5 MG/0.5ML ~~LOC~~ SOAJ
7.5000 mg | SUBCUTANEOUS | 2 refills | Status: DC
  Filled 2021-10-09 (×2): qty 2, 28d supply, fill #0

## 2021-10-25 ENCOUNTER — Other Ambulatory Visit: Payer: Self-pay | Admitting: Family Medicine

## 2021-10-31 ENCOUNTER — Other Ambulatory Visit: Payer: Self-pay | Admitting: Internal Medicine

## 2021-11-08 LAB — HM DIABETES EYE EXAM

## 2021-11-09 ENCOUNTER — Encounter: Payer: Self-pay | Admitting: Internal Medicine

## 2021-11-09 ENCOUNTER — Ambulatory Visit: Payer: Medicaid Other | Admitting: Internal Medicine

## 2021-11-09 VITALS — BP 120/84 | HR 93 | Ht 62.0 in | Wt 181.4 lb

## 2021-11-09 DIAGNOSIS — E669 Obesity, unspecified: Secondary | ICD-10-CM | POA: Diagnosis not present

## 2021-11-09 DIAGNOSIS — E782 Mixed hyperlipidemia: Secondary | ICD-10-CM | POA: Diagnosis not present

## 2021-11-09 DIAGNOSIS — E1159 Type 2 diabetes mellitus with other circulatory complications: Secondary | ICD-10-CM | POA: Diagnosis not present

## 2021-11-09 LAB — POCT GLYCOSYLATED HEMOGLOBIN (HGB A1C): Hemoglobin A1C: 7.1 % — AB (ref 4.0–5.6)

## 2021-11-09 MED ORDER — DAPAGLIFLOZIN PROPANEDIOL 10 MG PO TABS: 10.0000 mg | ORAL_TABLET | Freq: Every day | ORAL | 3 refills | Status: DC

## 2021-11-09 NOTE — Progress Notes (Signed)
Patient ID: [name], female   DOB: 04-01-1976, 46 y.o.   MRN: [id number]  ? ?This visit occurred during the SARS-CoV-2 public health emergency.  Safety protocols were in place, including screening questions prior to the visit, additional usage of staff PPE, and extensive cleaning of exam room while observing appropriate contact time as indicated for disinfecting solutions.  ? ?HPI: ?[name] is a 46 y.o.-year-old female, returning for f/u for DM2, dx in 2005, insulin-dependent since 2018, uncontrolled, without long-term complications.  She previously saw Dr. Dorris Fetch. First visit with me 02/2018.  Last visit 3 months ago. ? ?Interim history: ?No increased urination, blurry vision, nausea, chest pain. ?She has problems with her OmniPod PDM malfunctioning.  She sometimes cannot bolus for meals as she cannot turn it on.  Also, it sometimes dies while she is at work and she has to wait until coming home to get insulin. ? ?Reviewed HbA1c levels: ?Lab Results  ?Component Value Date  ? HGBA1C 7.2 (A) 08/10/2021  ? HGBA1C 6.6 (A) 03/30/2021  ? HGBA1C 8.3 (A) 12/15/2020  ? ?She was previously on:  ?- Farxiga 5 mg daily in am - started 12/2018 >> 10 mg daily in a.m. ?- Ozempic - started 02/2018 >> 1 mg >> 2 mg weekly ?- Lantus 60 >> 66 units at bedtime >> 30 units in a.m. and 40 units at bedtime ?- Novolog 12 units 3x a day before meals -started 11/2020 >> 12 to 15 units before each meal ?Also, she was on Metformin ER 1000 mg twice a day with meals - started 05/2018 >> GI intolerance ?She stopped Invokana b/c yeast inf's. ?She stopped Janumet due to abdominal pain and diarrhea. ?Stopped Glipizide when we started Novolog. ? ?Insulin pump: ?OmniPod DASH - defective ? ?CGM: ?Dexcom G6 - [place] approved 08/08/2021 ? ?Insulin: ?NovoLog ? ?Supplies: ?Pharmacy ? ?Pump settings: ?- basal rates: ?12 am: 2 units/h ?- ICR: 1:1 >> 1:6  ?- target: 70-120 >> 120-120 ?- ISF: 25 ?- Insulin on Board: 4h ?- bolus wizard: on ?TDD  from basal insulin: 48 units (55%) >> 69% >> 63% ?TDD from bolus insulin: 37 units (45%) >> 31% >> 27% ?Total daily dose 70-100 units daily ?- extended bolusing: not using ?- changes infusion site: q3 days ? ?Also: ?- Farxiga 10 mg daily in a.m. ?- Ozempic 2 mg weekly >> Mounjaro 7.5 mg weekly ? ?Pt checks her sugars 4x a day with her Dexcom: ? ? ?Previously: ? ? ? ? ?Previously: ? ? ?Lowest sugar was 69 - pm ... 84 >> 70 >> 80s >> 80; she has hypoglycemia awareness in the 70s. ?Highest sugar was 279 >> 250 >> >350 >> 200s. ? ?Continues to walk for exercise. ? ?Glucometer: Truemetrix  ? ?Pt's meals are: ?- Breakfast: cereal + milk, muffin + fruit ?- Lunch: salad, sandwich, hamburger ?- Dinner: meat + veggies  ?- Snacks: fruit, crackers ? ?-no CKD, last BUN/creatinine:  ?Lab Results  ?Component Value Date  ? BUN 17 09/21/2021  ? BUN 11 01/12/2021  ? CREATININE 1.03 (H) 09/21/2021  ? CREATININE 0.81 01/12/2021  ?Allergic to ACEI. ? ?+ Hyperlipidemia; last set of lipids: ?Lab Results  ?Component Value Date  ? CHOL 137 09/21/2021  ? HDL 44 09/21/2021  ? [place] 82 09/21/2021  ? TRIG 47 09/21/2021  ? CHOLHDL 3.1 09/21/2021  ?On pravastatin 10. ? ?- last eye exam was 11/08/2021: No DR reportedly. [facility]. ? ?- no numbness and tingling in her feet.  Latest foot exam: 02/2021. ? ?Pt has FH of DM in M. ? ?ROS: ?+ see HPI ? ?I reviewed pt's medications, allergies, PMH, social hx, family hx, and changes were documented in the history of present illness. Otherwise, unchanged from my initial visit note. ? ?Past Medical History:  ?Diagnosis Date  ? Constipation   ? GERD (gastroesophageal reflux disease)   ? Hypertension   ? Morbid obesity ([place])   ? NIDDM (non-insulin dependent diabetes mellitus) 2005  ? Type II  ? OSA (obstructive sleep apnea) 06/24/2018  ? ?Past Surgical History:  ?Procedure Laterality Date  ? BACK SURGERY  05/25/2010  ? Dr,Kabell   ? CESAREAN SECTION  6/09, 2015  ? LUMBAR LAMINECTOMY/DECOMPRESSION  MICRODISCECTOMY Left 09/13/2015  ? Procedure: Left Lumbar five-Sacral one microdiskectomy;  Surgeon: Ashok Pall, MD;  Location: [facility];  Service: Neurosurgery;  Laterality: Left;  Left Lumbar five-Sacral one microdiskectomy  ? ?Social History  ? ?Socioeconomic History  ? Marital status: Married  ?  Spouse name: Not on file  ? Number of children: 2  ? Years of education: Not on file  ? Highest education level: Not on file  ?Occupational History  ? Occupation: unemployed   ?Tobacco Use  ? Smoking status: Never Smoker  ? Smokeless tobacco: Never Used  ?Substance and Sexual Activity  ? Alcohol use: No  ? Drug use: No  ? ?Current Outpatient Medications on File Prior to Visit  ?Medication Sig Dispense Refill  ? azelastine (ASTELIN) 0.1 % nasal spray Place 2 sprays into both nostrils 2 (two) times daily. Use in each nostril as directed 30 mL 3  ? Continuous Blood Gluc Transmit (DEXCOM G6 TRANSMITTER) MISC 1 Device by Does not apply route every 3 (three) months. 1 each 3  ? ergocalciferol (VITAMIN D2) 1.25 MG (50000 UT) capsule Take 1 capsule (50,000 Units total) by mouth once a week. One capsule once weekly 12 capsule 2  ? FARXIGA 10 MG TABS tablet TAKE 1 TABLET BY MOUTH DAILY BEFORE BREAKFAST 30 tablet 3  ? Glucagon (BAQSIMI ONE PACK) 3 MG/DOSE POWD Use as needed for hypoglycemia 1 each 11  ? hydrochlorothiazide (HYDRODIURIL) 25 MG tablet TAKE 1 TABLET BY MOUTH EVERY DAY 30 tablet 1  ? Insulin Disposable Pump (OMNIPOD DASH PODS, GEN 4,) MISC Use 1 pod every 2 days 45 each 3  ? montelukast (SINGULAIR) 10 MG tablet TAKE 1 TABLET BY MOUTH AT BEDTIME 30 tablet 3  ? NOVOLOG 100 UNIT/ML injection USE UP TO 110 units a DAY in THE insulin pump 100 mL 3  ? potassium chloride (KLOR-CON) 10 MEQ tablet TAKE 1 TABLET BY MOUTH DAILY 30 tablet 5  ? pravastatin (PRAVACHOL) 10 MG tablet TAKE 1 TABLET BY MOUTH AT BEDTIME 30 tablet 1  ? tirzepatide (MOUNJARO) 7.5 MG/0.5ML Pen Inject 7.5 mg into the skin once a week. 2 mL 2  ? ?No  current facility-administered medications on file prior to visit.  ? ?Allergies  ?Allergen Reactions  ? Ace Inhibitors Cough  ? Amlodipine Besylate   ?  REACTION: severe edema of legs  ? ?Family History  ?Problem Relation Age of Onset  ? Diabetes Father   ? Hypertension Father   ? Kidney disease Father   ? Kidney failure Father   ? Diabetes Mother   ? Hypertension Mother   ? ?PE: ?BP 120/84 (BP Location: Left Arm, Patient Position: Sitting, Cuff Size: Normal)   Pulse 93   Ht '5\' 2"'$  (1.575 m)   Wt  181 lb 6.4 oz (82.3 kg)   SpO2 99%   BMI 33.18 kg/m?  ?Wt Readings from Last 3 Encounters:  ?11/09/21 181 lb 6.4 oz (82.3 kg)  ?08/10/21 180 lb (81.6 kg)  ?06/29/21 179 lb 1.9 oz (81.2 kg)  ? ?Constitutional: overweight, in NAD ?Eyes: PERRLA, EOMI, no exophthalmos ?ENT: moist mucous membranes, no thyromegaly, no cervical lymphadenopathy ?Cardiovascular: tachycardia, RR, No MRG ?Respiratory: CTA B ?Musculoskeletal: no deformities, strength intact in all 4 ?Skin: moist, warm, no rashes ?Neurological: no tremor with outstretched hands, DTR normal in all 4 ? ?ASSESSMENT: ?1. DM2, insulin-dependent, uncontrolled, without long-term complications, but with hyperglycemia ? ?2. HL ? ?3. Obesity class I ? ?PLAN:  ?1. Patient with longstanding, uncontrolled, type 2 diabetes, on a complex diabetic regimen with SGLT2 inhibitor, weekly GLP-1/GIP receptor agonist and also on OmniPod insulin pump + Dexcom CGM.  Her sugars improved significantly after starting insulin pump and also after starting Mounjaro.  At last visit, she returned after being off the medication due to being on backorder, but she was able to restart it right before the visit.  Because of this, her blood sugars were much worse compared to before, fluctuating usually above our upper limit of the target range of 180 with significantly increased blood sugars after breakfast and lunch and also after dinner.  We discussed that the sugars will most likely improve after  restarting Mounjaro, but reviewing her insulin pump I also advised her to try to introduce carbs into the pump and bolus for lunch and dinner as she was not doing so.  We did not change her pump settings at that time. ?CGM

## 2021-11-09 NOTE — Patient Instructions (Addendum)
Please continue: ?- Farxiga 10 mg before b'fast ?- Mounjaro 7.5 mg weekly ? ?Also: ?- basal rates: ?12 am: 2 units/h ?- target: 120-120 ?- ICR 1:6 ?- ISF: 25 ?- Insulin on Board: 4h ? ?Try to change the Omnipod DASH PDM. ? ?Please return in 3-4 months. ?

## 2021-11-14 ENCOUNTER — Encounter: Payer: Self-pay | Admitting: Internal Medicine

## 2021-11-26 ENCOUNTER — Other Ambulatory Visit: Payer: Self-pay | Admitting: Family Medicine

## 2021-12-03 ENCOUNTER — Other Ambulatory Visit: Payer: Self-pay | Admitting: Family Medicine

## 2021-12-22 ENCOUNTER — Other Ambulatory Visit: Payer: Self-pay | Admitting: Internal Medicine

## 2021-12-22 DIAGNOSIS — E1159 Type 2 diabetes mellitus with other circulatory complications: Secondary | ICD-10-CM

## 2021-12-24 ENCOUNTER — Other Ambulatory Visit: Payer: Self-pay | Admitting: Internal Medicine

## 2021-12-24 ENCOUNTER — Other Ambulatory Visit: Payer: Self-pay | Admitting: Family Medicine

## 2021-12-28 ENCOUNTER — Other Ambulatory Visit (HOSPITAL_COMMUNITY): Payer: Self-pay | Admitting: Family Medicine

## 2021-12-28 ENCOUNTER — Ambulatory Visit: Payer: Medicaid Other | Admitting: Family Medicine

## 2021-12-28 ENCOUNTER — Encounter: Payer: Self-pay | Admitting: Family Medicine

## 2021-12-28 VITALS — BP 127/84 | HR 88 | Ht 62.0 in | Wt 180.1 lb

## 2021-12-28 DIAGNOSIS — E782 Mixed hyperlipidemia: Secondary | ICD-10-CM

## 2021-12-28 DIAGNOSIS — E1159 Type 2 diabetes mellitus with other circulatory complications: Secondary | ICD-10-CM

## 2021-12-28 DIAGNOSIS — E559 Vitamin D deficiency, unspecified: Secondary | ICD-10-CM | POA: Diagnosis not present

## 2021-12-28 DIAGNOSIS — Z1231 Encounter for screening mammogram for malignant neoplasm of breast: Secondary | ICD-10-CM

## 2021-12-28 DIAGNOSIS — Z1211 Encounter for screening for malignant neoplasm of colon: Secondary | ICD-10-CM | POA: Diagnosis not present

## 2021-12-28 DIAGNOSIS — E669 Obesity, unspecified: Secondary | ICD-10-CM | POA: Diagnosis not present

## 2021-12-28 DIAGNOSIS — I10 Essential (primary) hypertension: Secondary | ICD-10-CM | POA: Diagnosis not present

## 2021-12-28 NOTE — Assessment & Plan Note (Signed)
Currently uncontrolled but working with Endo on this [name] is reminded of the importance of commitment to daily physical activity for 30 minutes or more, as able and the need to limit carbohydrate intake to 30 to 60 grams per meal to help with blood sugar control.   The need to take medication as prescribed, test blood sugar as directed, and to call between visits if there is a concern that blood sugar is uncontrolled is also discussed.   [name] is reminded of the importance of daily foot exam, annual eye examination, and good blood sugar, blood pressure and cholesterol control.     Latest Ref Rng & Units 11/09/2021    9:10 AM 09/21/2021    8:20 AM 08/10/2021   10:50 AM 03/30/2021    9:32 AM 01/12/2021    9:31 AM  Diabetic Labs  HbA1c 4.0 - 5.6 % 7.1    7.2   6.6     Micro/Creat Ratio 0 - 29 mg/g creat     23    Chol 100 - 199 mg/dL  137     139    HDL >39 mg/dL  44     37    Calc LDL 0 - 99 mg/dL  82     86    Triglycerides 0 - 149 mg/dL  47     79    Creatinine 0.57 - 1.00 mg/dL  1.03     0.81        12/28/2021    8:04 AM 11/09/2021    9:07 AM 08/10/2021   10:36 AM 06/29/2021   11:43 AM 03/30/2021    8:26 AM 03/02/2021    8:40 AM 12/15/2020   10:23 AM  BP/Weight  Systolic BP 622 633 354 562 563 893 734  Diastolic BP 84 84 88 83 86 75 90  Wt. (Lbs) 180.08 181.4 180 179.12 179 179.8 177  BMI 32.94 kg/m2 33.18 kg/m2 32.92 kg/m2 32.76 kg/m2 32.74 kg/m2 32.89 kg/m2 32.37 kg/m2      Latest Ref Rng & Units 12/28/2021    8:00 AM 11/08/2021   12:00 AM  Foot/eye exam completion dates  Eye Exam No Retinopathy  No Retinopathy       Foot Form Completion  Done      This result is from an external source.

## 2021-12-28 NOTE — Progress Notes (Signed)
[name]     MRN: [id number]      DOB: 12/05/1975   HPI [name] is here for follow up and re-evaluation of chronic medical conditions, medication management and review of any available recent lab and radiology data.  Preventive health is updated, specifically  Cancer screening and Immunization.   Questions or concerns regarding consultations or procedures which the PT has had in the interim are  addressed. The PT denies any adverse reactions to current medications since the last visit.  Malfunctioning insulin pump caused sugar to be off but working on this  ROS Denies recent fever or chills. Denies sinus pressure, nasal congestion, ear pain or sore throat. Denies chest congestion, productive cough or wheezing. Denies chest pains, palpitations and leg swelling Denies abdominal pain, nausea, vomiting,diarrhea or constipation.   Denies dysuria, frequency, hesitancy or incontinence. Denies joint pain, swelling and limitation in mobility. Denies headaches, seizures, numbness, or tingling. Denies depression, anxiety or insomnia. Denies skin break down or rash.   PE  BP 127/84   Pulse 88   Ht '5\' 2"'$  (1.575 m)   Wt 180 lb 1.3 oz (81.7 kg)   SpO2 96%   BMI 32.94 kg/m   Patient alert and oriented and in no cardiopulmonary distress.  HEENT: No facial asymmetry, EOMI,     Neck supple .  Chest: Clear to auscultation bilaterally.  CVS: S1, S2 no murmurs, no S3.Regular rate.  ABD: Soft non tender.   Ext: No edema  MS: Adequate ROM spine, shoulders, hips and knees.  Skin: Intact, no ulcerations or rash noted.  Psych: Good eye contact, normal affect. Memory intact not anxious or depressed appearing.  CNS: CN 2-12 intact, power,  normal throughout.no focal deficits noted.   Assessment & Plan  Essential hypertension Controlled, no change in medication DASH diet and commitment to daily physical activity for a minimum of 30 minutes discussed and encouraged, as a  part of hypertension management. The importance of attaining a healthy weight is also discussed.     12/28/2021    8:04 AM 11/09/2021    9:07 AM 08/10/2021   10:36 AM 06/29/2021   11:43 AM 03/30/2021    8:26 AM 03/02/2021    8:40 AM 12/15/2020   10:23 AM  BP/Weight  Systolic BP 967 591 638 466 599 357 017  Diastolic BP 84 84 88 83 86 75 90  Wt. (Lbs) 180.08 181.4 180 179.12 179 179.8 177  BMI 32.94 kg/m2 33.18 kg/m2 32.92 kg/m2 32.76 kg/m2 32.74 kg/m2 32.89 kg/m2 32.37 kg/m2       Mixed hyperlipidemia Hyperlipidemia:Low fat diet discussed and encouraged.   Lipid Panel  Lab Results  Component Value Date   CHOL 137 09/21/2021   HDL 44 09/21/2021   LDLCALC 82 09/21/2021   TRIG 47 09/21/2021   CHOLHDL 3.1 09/21/2021     Updated lab needed at/ before next visit.   Obesity (BMI 30.0-34.9)  Patient re-educated about  the importance of commitment to a  minimum of 150 minutes of exercise per week as able.  The importance of healthy food choices with portion control discussed, as well as eating regularly and within a 12 hour window most days. The need to choose "clean , green" food 50 to 75% of the time is discussed, as well as to make water the primary drink and set a goal of 64 ounces water daily.       12/28/2021    8:04 AM 11/09/2021  9:07 AM 08/10/2021   10:36 AM  Weight /BMI  Weight 180 lb 1.3 oz 181 lb 6.4 oz 180 lb  Height '5\' 2"'$  (1.575 m) '5\' 2"'$  (1.575 m) '5\' 2"'$  (1.575 m)  BMI 32.94 kg/m2 33.18 kg/m2 32.92 kg/m2      Type 2 diabetes mellitus with vascular disease ([place]) Currently uncontrolled but working with Endo on this [name] is reminded of the importance of commitment to daily physical activity for 30 minutes or more, as able and the need to limit carbohydrate intake to 30 to 60 grams per meal to help with blood sugar control.   The need to take medication as prescribed, test blood sugar as directed, and to call between visits if there is a concern that  blood sugar is uncontrolled is also discussed.   [name] is reminded of the importance of daily foot exam, annual eye examination, and good blood sugar, blood pressure and cholesterol control.     Latest Ref Rng & Units 11/09/2021    9:10 AM 09/21/2021    8:20 AM 08/10/2021   10:50 AM 03/30/2021    9:32 AM 01/12/2021    9:31 AM  Diabetic Labs  HbA1c 4.0 - 5.6 % 7.1    7.2   6.6     Micro/Creat Ratio 0 - 29 mg/g creat     23    Chol 100 - 199 mg/dL  137     139    HDL >39 mg/dL  44     37    Calc LDL 0 - 99 mg/dL  82     86    Triglycerides 0 - 149 mg/dL  47     79    Creatinine 0.57 - 1.00 mg/dL  1.03     0.81        12/28/2021    8:04 AM 11/09/2021    9:07 AM 08/10/2021   10:36 AM 06/29/2021   11:43 AM 03/30/2021    8:26 AM 03/02/2021    8:40 AM 12/15/2020   10:23 AM  BP/Weight  Systolic BP 629 528 413 244 010 272 536  Diastolic BP 84 84 88 83 86 75 90  Wt. (Lbs) 180.08 181.4 180 179.12 179 179.8 177  BMI 32.94 kg/m2 33.18 kg/m2 32.92 kg/m2 32.76 kg/m2 32.74 kg/m2 32.89 kg/m2 32.37 kg/m2      Latest Ref Rng & Units 12/28/2021    8:00 AM 11/08/2021   12:00 AM  Foot/eye exam completion dates  Eye Exam No Retinopathy  No Retinopathy       Foot Form Completion  Done      This result is from an external source.        Vitamin D deficiency Updated lab needed at/ before next visit.

## 2021-12-28 NOTE — Assessment & Plan Note (Signed)
Updated lab needed at/ before next visit.   

## 2021-12-28 NOTE — Assessment & Plan Note (Signed)
  Patient re-educated about  the importance of commitment to a  minimum of 150 minutes of exercise per week as able.  The importance of healthy food choices with portion control discussed, as well as eating regularly and within a 12 hour window most days. The need to choose "clean , green" food 50 to 75% of the time is discussed, as well as to make water the primary drink and set a goal of 64 ounces water daily.       12/28/2021    8:04 AM 11/09/2021    9:07 AM 08/10/2021   10:36 AM  Weight /BMI  Weight 180 lb 1.3 oz 181 lb 6.4 oz 180 lb  Height '5\' 2"'$  (1.575 m) '5\' 2"'$  (1.575 m) '5\' 2"'$  (1.575 m)  BMI 32.94 kg/m2 33.18 kg/m2 32.92 kg/m2

## 2021-12-28 NOTE — Addendum Note (Signed)
Addended by: Fayrene Helper on: 12/28/2021 09:12 AM   Modules accepted: Orders

## 2021-12-28 NOTE — Patient Instructions (Addendum)
F/U in 5 months, call if you need me sooner..Flu vaccine at visit  Fasting lipid, cmp and EGFr, vit D 5 and microalb  days before visit   No change in meds  Good foot exam  You are referred for collonoscopy, read the letter that will come from the office   Excellent foot exam  Please schedule mammogram at checkout  Thanks for choosing [facility], we consider it a privelige to serve you.

## 2021-12-28 NOTE — Addendum Note (Signed)
Addended by: Eual Fines on: 12/28/2021 09:26 AM   Modules accepted: Orders

## 2021-12-28 NOTE — Assessment & Plan Note (Signed)
Controlled, no change in medication DASH diet and commitment to daily physical activity for a minimum of 30 minutes discussed and encouraged, as a part of hypertension management. The importance of attaining a healthy weight is also discussed.     12/28/2021    8:04 AM 11/09/2021    9:07 AM 08/10/2021   10:36 AM 06/29/2021   11:43 AM 03/30/2021    8:26 AM 03/02/2021    8:40 AM 12/15/2020   10:23 AM  BP/Weight  Systolic BP 342 876 811 572 620 355 974  Diastolic BP 84 84 88 83 86 75 90  Wt. (Lbs) 180.08 181.4 180 179.12 179 179.8 177  BMI 32.94 kg/m2 33.18 kg/m2 32.92 kg/m2 32.76 kg/m2 32.74 kg/m2 32.89 kg/m2 32.37 kg/m2

## 2021-12-28 NOTE — Assessment & Plan Note (Signed)
Hyperlipidemia:Low fat diet discussed and encouraged.   Lipid Panel  Lab Results  Component Value Date   CHOL 137 09/21/2021   HDL 44 09/21/2021   LDLCALC 82 09/21/2021   TRIG 47 09/21/2021   CHOLHDL 3.1 09/21/2021     Updated lab needed at/ before next visit.

## 2021-12-30 ENCOUNTER — Other Ambulatory Visit: Payer: Self-pay | Admitting: Internal Medicine

## 2021-12-30 DIAGNOSIS — E1159 Type 2 diabetes mellitus with other circulatory complications: Secondary | ICD-10-CM

## 2021-12-31 ENCOUNTER — Ambulatory Visit: Payer: Medicaid Other | Admitting: Dietician

## 2021-12-31 ENCOUNTER — Encounter (INDEPENDENT_AMBULATORY_CARE_PROVIDER_SITE_OTHER): Payer: Self-pay | Admitting: *Deleted

## 2022-01-21 ENCOUNTER — Other Ambulatory Visit: Payer: Self-pay | Admitting: Family Medicine

## 2022-02-18 ENCOUNTER — Other Ambulatory Visit: Payer: Self-pay | Admitting: Family Medicine

## 2022-03-13 ENCOUNTER — Other Ambulatory Visit: Payer: Self-pay | Admitting: Family Medicine

## 2022-03-15 ENCOUNTER — Encounter: Payer: Medicaid Other | Attending: Internal Medicine | Admitting: Dietician

## 2022-03-15 ENCOUNTER — Encounter: Payer: Self-pay | Admitting: Dietician

## 2022-03-15 ENCOUNTER — Ambulatory Visit: Payer: Medicaid Other | Admitting: Internal Medicine

## 2022-03-15 ENCOUNTER — Encounter: Payer: Self-pay | Admitting: Internal Medicine

## 2022-03-15 VITALS — BP 120/82 | HR 91 | Ht 62.0 in | Wt 180.4 lb

## 2022-03-15 DIAGNOSIS — E1159 Type 2 diabetes mellitus with other circulatory complications: Secondary | ICD-10-CM | POA: Insufficient documentation

## 2022-03-15 DIAGNOSIS — E782 Mixed hyperlipidemia: Secondary | ICD-10-CM

## 2022-03-15 DIAGNOSIS — E669 Obesity, unspecified: Secondary | ICD-10-CM | POA: Diagnosis not present

## 2022-03-15 LAB — POCT GLYCOSYLATED HEMOGLOBIN (HGB A1C): Hemoglobin A1C: 6.6 % — AB (ref 4.0–5.6)

## 2022-03-15 NOTE — Patient Instructions (Addendum)
Please continue: - Farxiga 10 mg before b'fast - Mounjaro 7.5 mg weekly  Also: - basal rates: 12 am: 2 >> 2.2 units/h - target: 120-120 - ICR 1:6 - ISF: 25 - Insulin on Board: 4h  Please return in 4 months.

## 2022-03-15 NOTE — Progress Notes (Signed)
Diabetes Self-Management Education  Visit Type: First/Initial  Appt. Start Time: 0950 Appt. End Time: 1020  03/15/2022  [name], identified by name and date of birth, is a 46 y.o. female with a diagnosis of Diabetes: Type 2.   ASSESSMENT Patient is here today alone. She was referred to update her Omnipod DASH PDM to a new PDM.   She was able to update all of her settings herself prior to this visit. She has stopped drinking coffee as she noted that it made her blood glucose increase.  Also reviewed that the powdered creamer had increased saturated and unhealthy fat.   Reviewed her Dexcom which shows she is in range 75% of the time with no low blood glucose events. She is walking daily for 30 minutes with her family each night after dinner She is going to [employer] 3-5 times per week.  History includes Type 2 Diabetes, HTN, GERD Medications include:  Novolog via pump, Farxiga, Baqsimi, Mounjaro Insulin pump:  Omnipod DASH CGM:  Dexcom G6 Labs:  A1C 6.6% 03/15/2022 decreased from 7.1% 11/09/2021 Vitamin D 18.1 09/21/2021 and is taking 50,000 units of vitamin D each week.  She states she is to see her PCP to have this rechecked soon and then will start over the counter vitamin D if it is in the norma range. Weight 180 lb (81.6 kg). Body mass index is 32.92 kg/m.   Diabetes Self-Management Education - 03/15/22 1254       Visit Information   Visit Type First/Initial      Initial Visit   Diabetes Type Type 2    Are you taking your medications as prescribed? Yes      Psychosocial Assessment   Self-care barriers None    Self-management support Doctor's office;CDE visits    Other persons present Patient    Patient Concerns Nutrition/Meal planning    Special Needs None    Preferred Learning Style No preference indicated    Learning Readiness Ready    How often do you need to have someone help you when you read instructions, pamphlets, or other written materials  from your doctor or pharmacy? 1 - Never      Pre-Education Assessment   Patient understands the diabetes disease and treatment process. Demonstrates understanding / competency    Patient understands incorporating nutritional management into lifestyle. Needs Review    Patient undertands incorporating physical activity into lifestyle. Demonstrates understanding / competency    Patient understands using medications safely. Demonstrates understanding / competency    Patient understands monitoring blood glucose, interpreting and using results Comprehends key points    Patient understands prevention, detection, and treatment of acute complications. Demonstrates understanding / competency    Patient understands prevention, detection, and treatment of chronic complications. Demonstrates understanding / competency    Patient understands how to develop strategies to address psychosocial issues. Demonstrates understanding / competency    Patient understands how to develop strategies to promote health/change behavior. Comprehends key points      Complications   Last HgB A1C per patient/outside source 6.6 %   03/15/2022 decreased from 7.1% 11/09/2021   How often do you check your blood sugar? > 4 times/day    Fasting Blood glucose range (mg/dL) 70-129    Postprandial Blood glucose range (mg/dL) 130-179;180-200    Number of hyperglycemic episodes ( >'200mg'$ /dL): Weekly      Activity / Exercise   Activity / Exercise Type Light (walking / raking leaves)    How many  days per week do you exercise? 7    How many minutes per day do you exercise? 45    Total minutes per week of exercise 315      Patient Education   Previous Diabetes Education Yes (please comment)   routine CDCES visits   Healthy Eating Carbohydrate counting   review   Monitoring Other (comment)   showed patient Dexcom G7 that should be available next year for Omnipod users     Individualized Goals (developed by patient)   Nutrition Carb  counting    Physical Activity Exercise 5-7 days per week;45 minutes per day    Medications take my medication as prescribed    Monitoring  Consistenly use CGM      Post-Education Assessment   Patient understands the diabetes disease and treatment process. Demonstrates understanding / competency    Patient understands incorporating nutritional management into lifestyle. Comprehends key points    Patient undertands incorporating physical activity into lifestyle. Demonstrates understanding / competency    Patient understands using medications safely. Demonstrates understanding / competency    Patient understands monitoring blood glucose, interpreting and using results Demonstrates understanding / competency    Patient understands prevention, detection, and treatment of acute complications. Demonstrates understanding / competency    Patient understands prevention, detection, and treatment of chronic complications. Demonstrates understanding / competency    Patient understands how to develop strategies to address psychosocial issues. Demonstrates understanding / competency    Patient understands how to develop strategies to promote health/change behavior. Comprehends key points      Outcomes   Expected Outcomes Demonstrated interest in learning. Expect positive outcomes    Future DMSE PRN    Program Status Completed             Individualized Plan for Diabetes Self-Management Training:   Learning Objective:  Patient will have a greater understanding of diabetes self-management. Patient education plan is to attend individual and/or group sessions per assessed needs and concerns.   Plan:   Patient Instructions  Continue to stay active! Continue to carbohydrate count.  Consider the Calorie [employer] app and other resources to help as needed.  Please call if you have questions (or MyChart)  Expected Outcomes:  Demonstrated interest in learning. Expect positive outcomes  Education material  provided: Meal plan card  If problems or questions, patient to contact team via:  Phone  Future DSME appointment: PRN

## 2022-03-15 NOTE — Patient Instructions (Signed)
Continue to stay active! Continue to carbohydrate count.  Consider the [employer] app and other resources to help as needed.  Please call if you have questions (or MyChart)

## 2022-03-15 NOTE — Progress Notes (Signed)
Patient ID: [name], female   DOB: 1976-02-19, 46 y.o.   MRN: [id number]   HPI: [name] is a 46 y.o.-year-old female, returning for f/u for DM2, dx in 2005, insulin-dependent since 2018, uncontrolled, without long-term complications.  She previously saw Dr. Dorris Fetch. First visit with me 02/2018.  Last visit 4 months ago.  Interim history: No increased urination, blurry vision, nausea, chest pain. She is exercising consistently and she is starting to also walk with husband.  Reviewed HbA1c levels: Lab Results  Component Value Date   HGBA1C 7.1 (A) 11/09/2021   HGBA1C 7.2 (A) 08/10/2021   HGBA1C 6.6 (A) 03/30/2021   She was previously on:  - Farxiga 5 mg daily in am - started 12/2018 >> 10 mg daily in a.m. - Ozempic - started 02/2018 >> 1 mg >> 2 mg weekly - Lantus 60 >> 66 units at bedtime >> 30 units in a.m. and 40 units at bedtime - Novolog 12 units 3x a day before meals -started 11/2020 >> 12 to 15 units before each meal Also, she was on Metformin ER 1000 mg twice a day with meals - started 05/2018 >> GI intolerance She stopped Invokana b/c yeast inf's. She stopped Janumet due to abdominal pain and diarrhea. Stopped Glipizide when we started Novolog.  Insulin pump: OmniPod DASH -changed last visit, as the previous one was defective  CGM: Dexcom G6 - [place] approved 08/08/2021  Insulin: NovoLog  Supplies: Pharmacy  Pump settings: - basal rates: 12 am: 2 units/h - ICR: 1:1 >> 1:6  - target: 70-120 >> 120-120 - ISF: 25 - Insulin on Board: 4h - bolus wizard: on TDD from basal insulin: 48 units (55%) >> 69% >> 63% >> 68-80% TDD from bolus insulin: 37 units (45%) >> 31% >> 27% >> 20-32% Total daily dose 70-100 units daily - extended bolusing: not using - changes infusion site: q3 days  Also: - Farxiga 10 mg daily in a.m. - Ozempic 2 mg weekly >> Mounjaro 7.5 mg weekly  Pt checks her sugars 4x a day with her  Dexcom:   Previously:   Previously:    Lowest sugar was 69 - pm ... >> 80 >> 80; she has hypoglycemia awareness in the 70s. Highest sugar was >350 >> 200s >> 200s  Continues to walk for exercise.  Glucometer: Truemetrix   Pt's meals are: - Breakfast: cereal + milk, muffin + fruit - Lunch: salad, sandwich, hamburger - Dinner: meat + veggies  - Snacks: fruit, crackers  -no CKD, last BUN/creatinine:  Lab Results  Component Value Date   BUN 17 09/21/2021   BUN 11 01/12/2021   CREATININE 1.03 (H) 09/21/2021   CREATININE 0.81 01/12/2021  Allergic to ACEI.  + Hyperlipidemia; last set of lipids: Lab Results  Component Value Date   CHOL 137 09/21/2021   HDL 44 09/21/2021   LDLCALC 82 09/21/2021   TRIG 47 09/21/2021   CHOLHDL 3.1 09/21/2021  On pravastatin 10.  - last eye exam was 11/08/2021: No DR reportedly. [facility].  Marland Kitchen no numbness and tingling in her feet.  Latest foot exam: 12/28/2021  Pt has FH of DM in M.  ROS: + see HPI  I reviewed pt's medications, allergies, PMH, social hx, family hx, and changes were documented in the history of present illness. Otherwise, unchanged from my initial visit note.  Past Medical History:  Diagnosis Date   Constipation    GERD (gastroesophageal reflux disease)    Hypertension  Morbid obesity ([place])    NIDDM (non-insulin dependent diabetes mellitus) 2005   Type II   OSA (obstructive sleep apnea) 06/24/2018   Past Surgical History:  Procedure Laterality Date   BACK SURGERY  05/25/2010   Dr,Kabell    CESAREAN SECTION  6/09, 2015   LUMBAR LAMINECTOMY/DECOMPRESSION MICRODISCECTOMY Left 09/13/2015   Procedure: Left Lumbar five-Sacral one microdiskectomy;  Surgeon: Ashok Pall, MD;  Location: [facility];  Service: Neurosurgery;  Laterality: Left;  Left Lumbar five-Sacral one microdiskectomy   Social History   Socioeconomic History   Marital status: Married    Spouse name: Not on file   Number of children: 2   Years  of education: Not on file   Highest education level: Not on file  Occupational History   Occupation: unemployed   Tobacco Use   Smoking status: Never Smoker   Smokeless tobacco: Never Used  Substance and Sexual Activity   Alcohol use: No   Drug use: No   Current Outpatient Medications on File Prior to Visit  Medication Sig Dispense Refill   azelastine (ASTELIN) 0.1 % nasal spray INSTILL TWO SPRAYS IN EACH NOSTRIL TWICE DAILY 30 mL 3   Continuous Blood Gluc Transmit (DEXCOM G6 TRANSMITTER) MISC 1 Device by Does not apply route every 3 (three) months. 1 each 3   dapagliflozin propanediol (FARXIGA) 10 MG TABS tablet Take 1 tablet (10 mg total) by mouth daily before breakfast. 90 tablet 3   ergocalciferol (VITAMIN D2) 1.25 MG (50000 UT) capsule Take 1 capsule (50,000 Units total) by mouth once a week. One capsule once weekly 12 capsule 2   Glucagon (BAQSIMI ONE PACK) 3 MG/DOSE POWD Use as needed for hypoglycemia 1 each 11   hydrochlorothiazide (HYDRODIURIL) 25 MG tablet TAKE 1 TABLET BY MOUTH EVERY DAY 30 tablet 5   Insulin Disposable Pump (OMNIPOD DASH PODS, GEN 4,) MISC USE ONE POD EVERY TWO DAYS 45 each 3   montelukast (SINGULAIR) 10 MG tablet TAKE 1 TABLET BY MOUTH AT BEDTIME 30 tablet 3   MOUNJARO 7.5 MG/0.5ML Pen INJECT 7.5 MG into THE SKIN ONCE a WEEK 2 mL 3   NOVOLOG 100 UNIT/ML injection USE UP TO 110 units a DAY in THE insulin pump 100 mL 3   potassium chloride (KLOR-CON M) 10 MEQ tablet TAKE 1 TABLET BY MOUTH DAILY 30 tablet 5   pravastatin (PRAVACHOL) 10 MG tablet TAKE 1 TABLET BY MOUTH AT BEDTIME 30 tablet 1   No current facility-administered medications on file prior to visit.   Allergies  Allergen Reactions   Ace Inhibitors Cough   Amlodipine Besylate     REACTION: severe edema of legs   Family History  Problem Relation Age of Onset   Diabetes Father    Hypertension Father    Kidney disease Father    Kidney failure Father    Diabetes Mother    Hypertension Mother     PE: BP 120/82 (BP Location: Right Arm, Patient Position: Sitting, Cuff Size: Normal)   Pulse 91   Ht '5\' 2"'$  (1.575 m)   Wt 180 lb 6.4 oz (81.8 kg)   SpO2 98%   BMI 33.00 kg/m  Wt Readings from Last 3 Encounters:  03/15/22 180 lb 6.4 oz (81.8 kg)  12/28/21 180 lb 1.3 oz (81.7 kg)  11/09/21 181 lb 6.4 oz (82.3 kg)   Constitutional: overweight, in NAD Eyes: no exophthalmos ENT: moist mucous membranes, no masses palpated in neck, no cervical lymphadenopathy Cardiovascular: RRR, No MRG Respiratory:  CTA B Musculoskeletal: no deformities Skin: moist, warm, no rashes Neurological: no tremor with outstretched hands  ASSESSMENT: 1. DM2, insulin-dependent, uncontrolled, without long-term complications, but with hyperglycemia  2. HL  3. Obesity class I  PLAN:  1. Patient with longstanding, uncontrolled, type 2 diabetes, on a complex medication regimen including SGLT2 inhibitor, GLP-1/GIP receptor agonist, and also OmniPod insulin pump + Dexcom G6 CGM.  Her sugars improved significantly after starting the insulin pump and also after starting Mounjaro.  At last visit, reviewing her CGM trends, her sugars were stable overnight but fluctuating around the upper target of the normal range and increasing after breakfast and lunch.  Sugars are much better after dinner.  Upon questioning, she was sometimes unable to turn on the PDM to give herself boluses and had to stay the entire day without insulin while at work.  She had contacted the company at that time but was not sent a new PDM as of last visit.  At the time of the visit, we called the OmniPod rep together and she was advised to call the customer service line to be sent another PDM.  We discussed about possibly switching to OmniPod 5, but I did advise her that this was a more complicated system and since the patient was not comfortable managing her pump, we decided to hold off. -At last visit she was telling me that when her sugars were at goal  before meals, for example in the 80s, she was not bolusing insulin.  I advised her to go ahead and bolus before each meal.  We did not change her pump settings at that time.  HbA1c was 7.1%, lower. CGM interpretation: -At today's visit, we reviewed her CGM downloads: It appears that 75% of values are in target range (goal >70%), while 25% are higher than 180 (goal <25%), and 0% are lower than 70 (goal <4%).  The calculated average blood sugar is 164.  The projected HbA1c for the next 3 months (GMI) is 7.2%. -Reviewing the CGM trends, it appears that her sugars are fluctuating in the higher half of the target range, with slight increase in blood sugars after breakfast and lunch, but better blood sugars after dinner.  The improvement in blood sugars after dinner is probably related to her walking in the evening.  I advised her to continue with this.  For now, since sugars are fluctuating in a slightly higher range than desired, I advised her to increase her basal rates throughout the day and night.  Of note, we are using a higher daily insulin dose from basal rates compared to bolus since he is also on [place] to help with postprandial blood sugars.  We will continue these for now.  At next visit, we can consider increasing the [facility], but the 10 mg dose is now on backorder.  She is able to get the 7.5 mg weekly now. - I suggested to:  Patient Instructions  Please continue: - Farxiga 10 mg before b'fast - Mounjaro 7.5 mg weekly  Also: - basal rates: 12 am: 2 >> 2.2 units/h - target: 120-120 - ICR 1:6 - ISF: 25 - Insulin on Board: 4h  Please return in 4 months.  - we checked her HbA1c: 6.6% (lower) - advised to check sugars at different times of the day - 4x a day, rotating check times - advised for yearly eye exams >> she is UTD - return to clinic in 3-4 months  2. HL -Reviewed latest lipid panel  from 09/2021: Fractions at goal: Lab Results  Component Value Date   CHOL 137  09/21/2021   HDL 44 09/21/2021   LDLCALC 82 09/21/2021   TRIG 47 09/21/2021   CHOLHDL 3.1 09/21/2021  -She continues on pravastatin 10 mg daily without side effects  3. Obesity class I -continue SGLT 2 inhibitor and GLP-1 receptor agonist which should also help with weight loss -She lost 17 pounds immediately after starting Ozempic and maintained the weight loss after switching to [facility] -At last visit, she gained 1 pound -At this visit, weight is stable  Philemon Kingdom, MD PhD [facility] Endocrinology

## 2022-03-18 ENCOUNTER — Other Ambulatory Visit: Payer: Self-pay | Admitting: Family Medicine

## 2022-03-19 IMAGING — MG DIGITAL SCREENING BILAT W/ TOMO W/ CAD
6 of 10 series · 6 of 30 positions shown · non-contrast
Comparison: Previous exam(s).

CLINICAL DATA: Screening.

EXAM:
DIGITAL SCREENING BILATERAL MAMMOGRAM WITH TOMO AND CAD

[R MLO synth-2D]
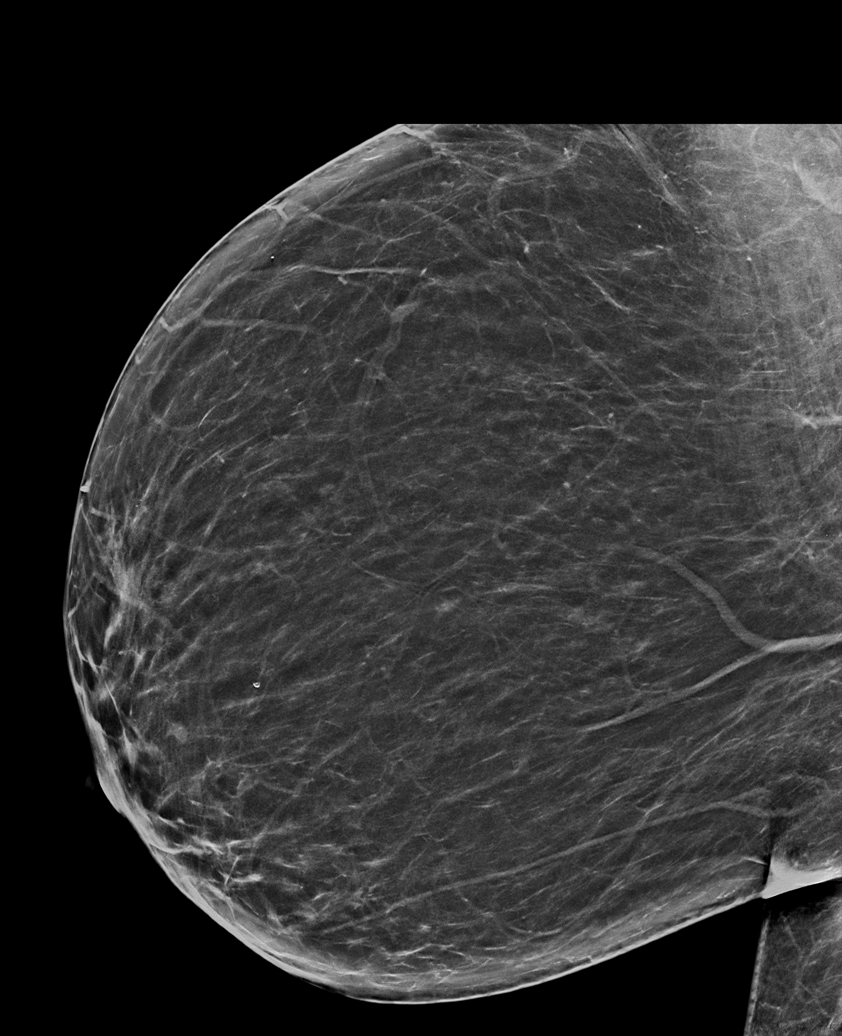

[R XCCL synth-2D]
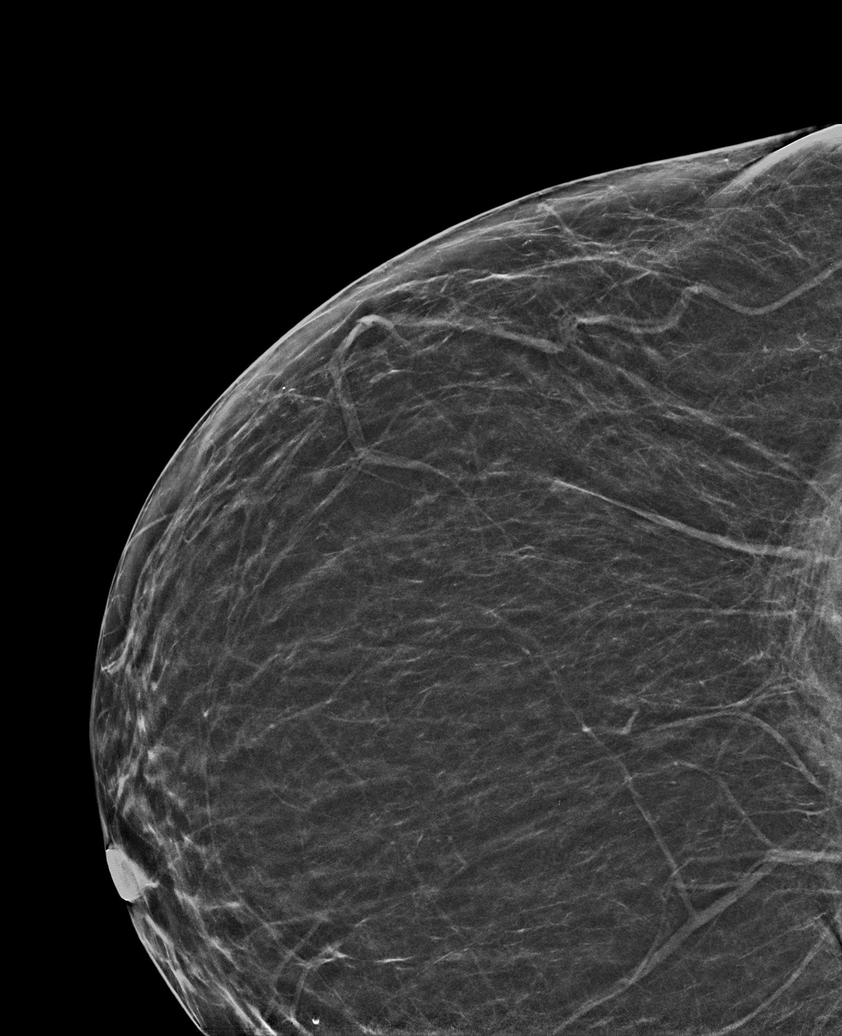

[R CC synth-2D]
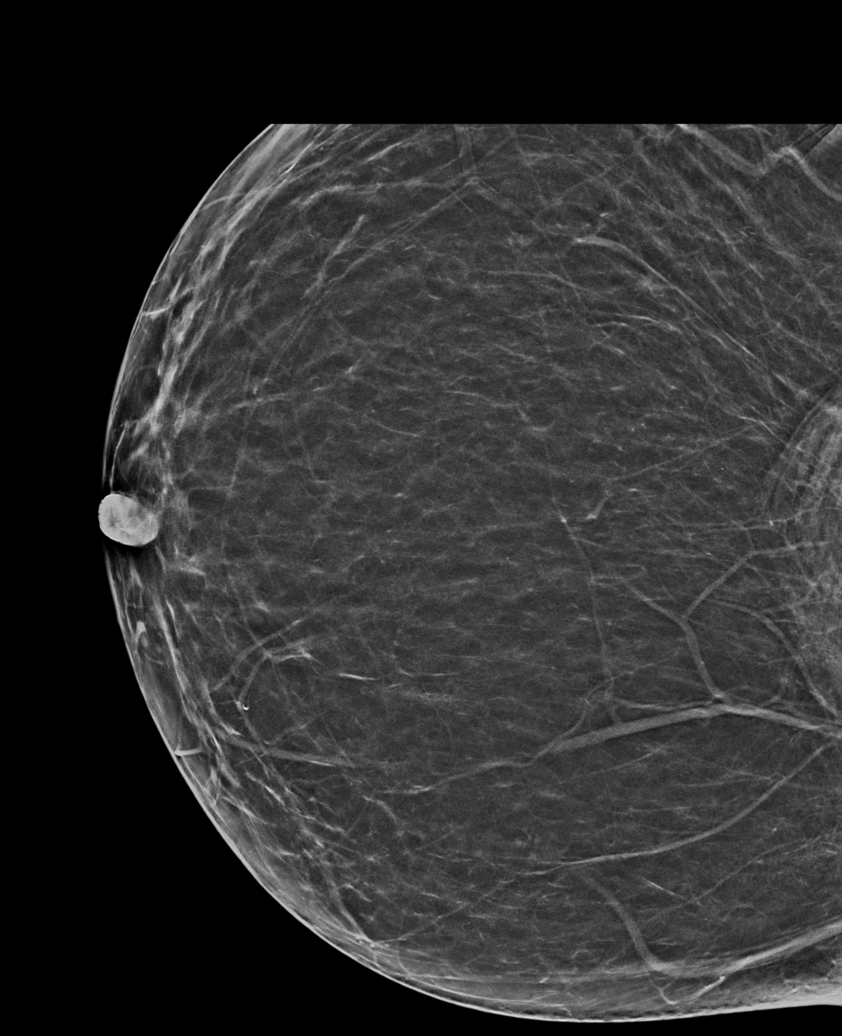

[L CC synth-2D]
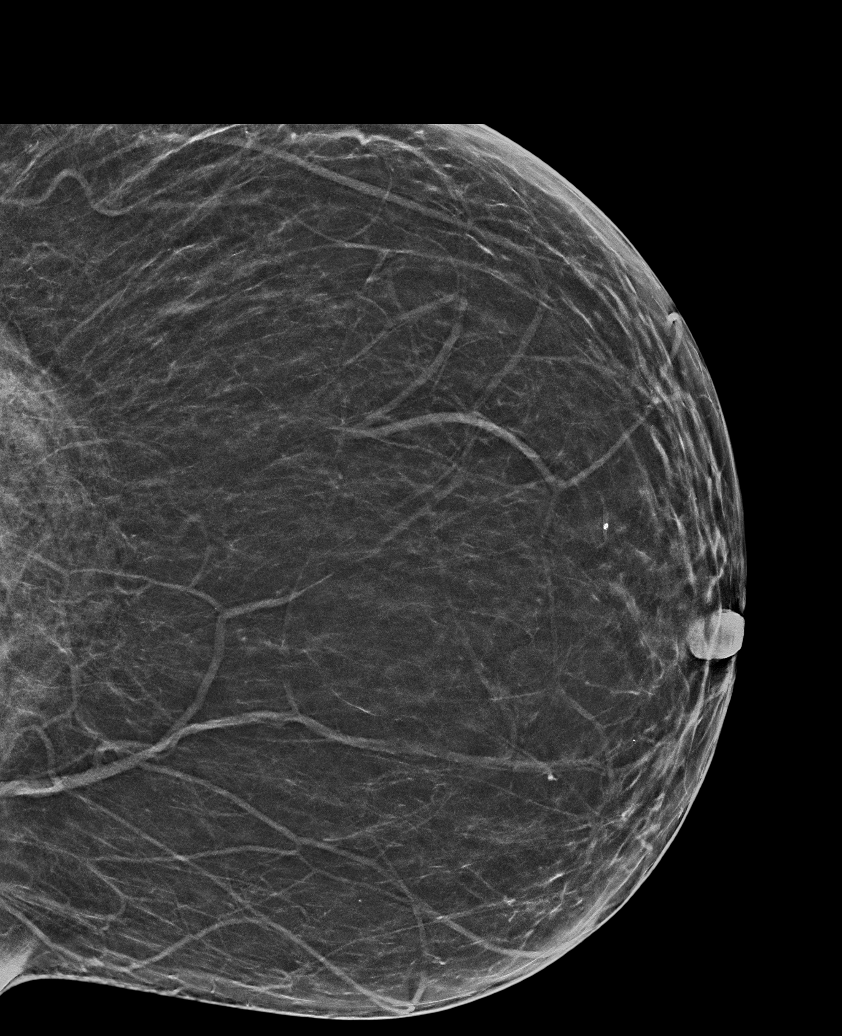

[L MLO synth-2D]
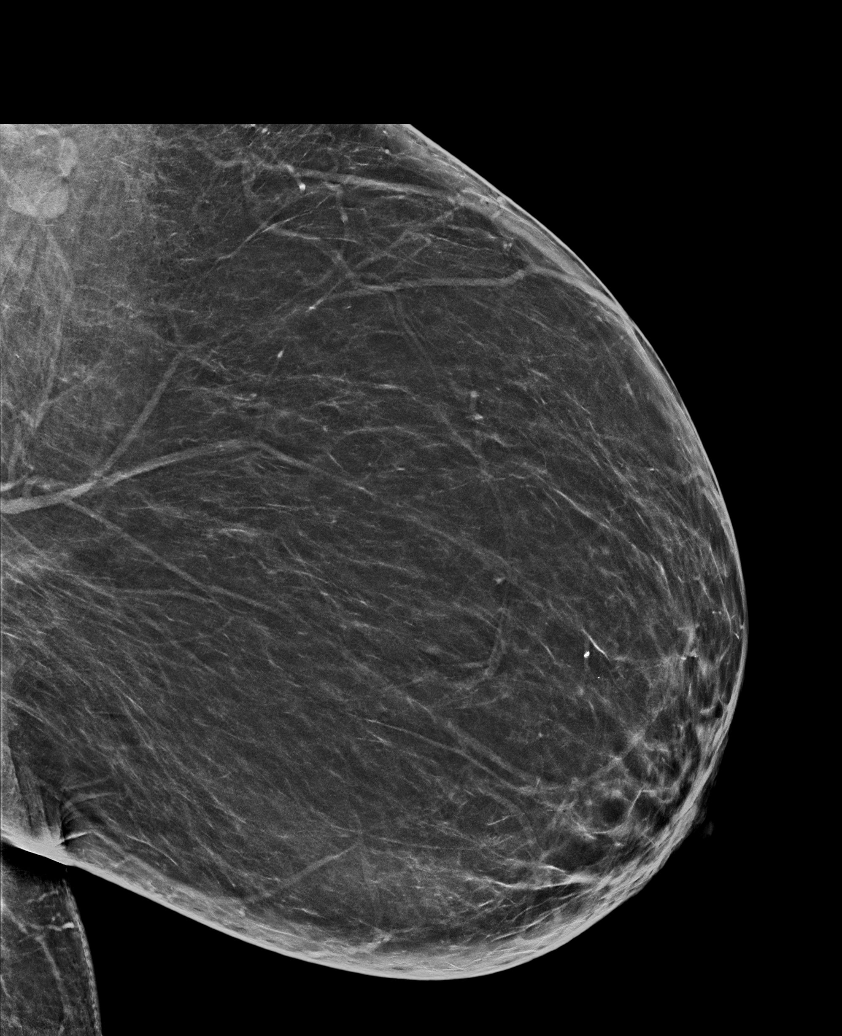

[L CC tomo · tomo slice 32/63.0]
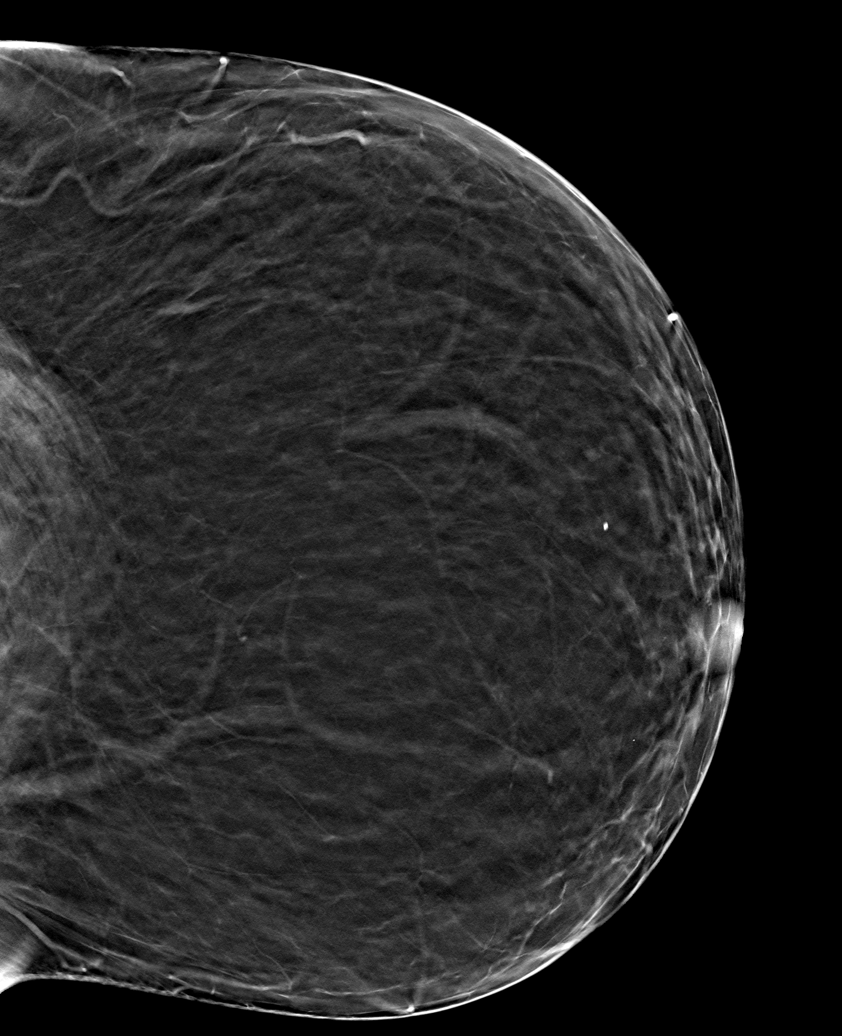

[6 of 30 positions shown; findings below may reference images not displayed]

ACR Breast Density Category b: There are scattered areas of
fibroglandular density.
FINDINGS: There are no findings suspicious for malignancy. Images were
processed with CAD.
IMPRESSION: No mammographic evidence of malignancy. A result letter of this
screening mammogram will be mailed directly to the patient.

RECOMMENDATION:
Screening mammogram in one year. (Code:[id number])

BI-RADS CATEGORY  1: Negative.

## 2022-05-08 ENCOUNTER — Telehealth: Payer: Self-pay

## 2022-05-08 DIAGNOSIS — E1159 Type 2 diabetes mellitus with other circulatory complications: Secondary | ICD-10-CM

## 2022-05-08 NOTE — Telephone Encounter (Signed)
Aspart is generic for NovoLog - OK to change

## 2022-05-08 NOTE — Telephone Encounter (Signed)
Inbound fax from [employer] insurance advising starting 05/12/2022 Novolog will no longer be the preferred insulin and needs to be  switched to insulin aspart U-100 or submit a Prior Auth.

## 2022-05-11 ENCOUNTER — Other Ambulatory Visit: Payer: Self-pay | Admitting: Family Medicine

## 2022-05-17 ENCOUNTER — Other Ambulatory Visit: Payer: Self-pay | Admitting: Internal Medicine

## 2022-05-17 DIAGNOSIS — E1159 Type 2 diabetes mellitus with other circulatory complications: Secondary | ICD-10-CM

## 2022-05-19 ENCOUNTER — Other Ambulatory Visit: Payer: Self-pay | Admitting: Family Medicine

## 2022-05-20 MED ORDER — INSULIN ASPART 100 UNIT/ML IJ SOLN: INTRAMUSCULAR | 1 refills | Status: DC

## 2022-05-20 NOTE — Telephone Encounter (Signed)
Updated rx sent to preferred pharmacy

## 2022-05-31 ENCOUNTER — Other Ambulatory Visit (HOSPITAL_COMMUNITY): Payer: Self-pay

## 2022-05-31 ENCOUNTER — Ambulatory Visit (HOSPITAL_COMMUNITY): Payer: Medicaid Other

## 2022-05-31 ENCOUNTER — Telehealth: Payer: Self-pay

## 2022-05-31 ENCOUNTER — Ambulatory Visit: Payer: Medicaid Other | Admitting: Family Medicine

## 2022-05-31 NOTE — Telephone Encounter (Signed)
Received notification from [facility] regarding a prior authorization renewal for Mounjaro 7.'5mg'$ /0.81m with [facility] (Medicaid).  The MJavier Dockeris available without authorization.   Key: [id number]

## 2022-06-07 ENCOUNTER — Ambulatory Visit (HOSPITAL_COMMUNITY)
Admission: RE | Admit: 2022-06-07 | Discharge: 2022-06-07 | Disposition: A | Discharge status: Home / Self Care | Payer: Medicaid Other | Source: Ambulatory Visit | Attending: Family Medicine | Admitting: Family Medicine

## 2022-06-07 DIAGNOSIS — Z1231 Encounter for screening mammogram for malignant neoplasm of breast: Secondary | ICD-10-CM

## 2022-06-10 ENCOUNTER — Encounter: Payer: Self-pay | Admitting: Family Medicine

## 2022-06-12 DIAGNOSIS — E782 Mixed hyperlipidemia: Secondary | ICD-10-CM | POA: Diagnosis not present

## 2022-06-12 DIAGNOSIS — E1159 Type 2 diabetes mellitus with other circulatory complications: Secondary | ICD-10-CM | POA: Diagnosis not present

## 2022-06-12 DIAGNOSIS — E559 Vitamin D deficiency, unspecified: Secondary | ICD-10-CM | POA: Diagnosis not present

## 2022-06-14 ENCOUNTER — Encounter: Payer: Self-pay | Admitting: Family Medicine

## 2022-06-14 ENCOUNTER — Ambulatory Visit: Payer: Medicaid Other | Admitting: Family Medicine

## 2022-06-14 VITALS — BP 120/80 | HR 86 | Ht 62.0 in | Wt 178.0 lb

## 2022-06-14 DIAGNOSIS — I10 Essential (primary) hypertension: Secondary | ICD-10-CM | POA: Diagnosis not present

## 2022-06-14 DIAGNOSIS — Z599 Problem related to housing and economic circumstances, unspecified: Secondary | ICD-10-CM

## 2022-06-14 DIAGNOSIS — E559 Vitamin D deficiency, unspecified: Secondary | ICD-10-CM

## 2022-06-14 DIAGNOSIS — E1159 Type 2 diabetes mellitus with other circulatory complications: Secondary | ICD-10-CM

## 2022-06-14 DIAGNOSIS — M542 Cervicalgia: Secondary | ICD-10-CM

## 2022-06-14 DIAGNOSIS — E782 Mixed hyperlipidemia: Secondary | ICD-10-CM

## 2022-06-14 DIAGNOSIS — E66811 Obesity, class 1: Secondary | ICD-10-CM

## 2022-06-14 DIAGNOSIS — Z1211 Encounter for screening for malignant neoplasm of colon: Secondary | ICD-10-CM | POA: Diagnosis not present

## 2022-06-14 DIAGNOSIS — E669 Obesity, unspecified: Secondary | ICD-10-CM | POA: Diagnosis not present

## 2022-06-14 DIAGNOSIS — Z23 Encounter for immunization: Secondary | ICD-10-CM

## 2022-06-14 LAB — LIPID PANEL
Chol/HDL Ratio: 3.7 ratio (ref 0.0–4.4)
Cholesterol, Total: 143 mg/dL (ref 100–199)
HDL: 39 mg/dL — ABNORMAL LOW (ref >39)
LDL Chol Calc (NIH): 90 mg/dL (ref 0–99)
Triglycerides: 67 mg/dL (ref 0–149)
VLDL Cholesterol Cal: 14 mg/dL (ref 5–40)

## 2022-06-14 LAB — CMP14+EGFR
ALT: 23 IU/L (ref 0–32)
AST: 20 IU/L (ref 0–40)
Albumin/Globulin Ratio: 1.6 (ref 1.2–2.2)
Albumin: 4.2 g/dL (ref 3.9–4.9)
Alkaline Phosphatase: 105 IU/L (ref 44–121)
BUN/Creatinine Ratio: 12 (ref 9–23)
BUN: 12 mg/dL (ref 6–24)
Bilirubin Total: 0.4 mg/dL (ref 0.0–1.2)
CO2: 28 mmol/L (ref 20–29)
Calcium: 9.8 mg/dL (ref 8.7–10.2)
Chloride: 96 mmol/L (ref 96–106)
Creatinine, Ser: 1.03 mg/dL — ABNORMAL HIGH (ref 0.57–1.00)
Globulin, Total: 2.6 g/dL (ref 1.5–4.5)
Glucose: 112 mg/dL — ABNORMAL HIGH (ref 70–99)
Potassium: 3.4 mmol/L — ABNORMAL LOW (ref 3.5–5.2)
Sodium: 138 mmol/L (ref 134–144)
Total Protein: 6.8 g/dL (ref 6.0–8.5)
eGFR: 68 mL/min/{1.73_m2} (ref >59)

## 2022-06-14 LAB — MICROALBUMIN / CREATININE URINE RATIO
Creatinine, Urine: 163.4 mg/dL
Microalb/Creat Ratio: 20 mg/g creat (ref 0–29)
Microalbumin, Urine: 32.3 ug/mL

## 2022-06-14 LAB — VITAMIN D 25 HYDROXY (VIT D DEFICIENCY, FRACTURES): Vit D, 25-Hydroxy: 63 ng/mL (ref 30.0–100.0)

## 2022-06-14 MED ORDER — VITAMIN D3 25 MCG (1000 UT) PO CAPS: 1000.0000 [IU] | ORAL_CAPSULE | Freq: Every day | ORAL | 2 refills | Status: DC

## 2022-06-14 NOTE — Assessment & Plan Note (Signed)
  Patient re-educated about  the importance of commitment to a  minimum of 150 minutes of exercise per week as able.  The importance of healthy food choices with portion control discussed, as well as eating regularly and within a 12 hour window most days. The need to choose "clean , green" food 50 to 75% of the time is discussed, as well as to make water the primary drink and set a goal of 64 ounces water daily.       06/14/2022    9:02 AM 03/15/2022   12:54 PM 03/15/2022    9:14 AM  Weight /BMI  Weight 178 lb 0.6 oz 180 lb 180 lb 6.4 oz  Height '5\' 2"'$  (1.575 m)  '5\' 2"'$  (1.575 m)  BMI 32.56 kg/m2 32.92 kg/m2 33 kg/m2

## 2022-06-14 NOTE — Assessment & Plan Note (Signed)
Controlled, no change in medication DASH diet and commitment to daily physical activity for a minimum of 30 minutes discussed and encouraged, as a part of hypertension management. The importance of attaining a healthy weight is also discussed.     06/14/2022    9:02 AM 03/15/2022   12:54 PM 03/15/2022    9:14 AM 12/28/2021    8:04 AM 11/09/2021    9:07 AM 08/10/2021   10:36 AM 06/29/2021   11:43 AM  BP/Weight  Systolic BP 518  841 660 630 160 109  Diastolic BP 80  82 84 84 88 83  Wt. (Lbs) 178.04 180 180.4 180.08 181.4 180 179.12  BMI 32.56 kg/m2 32.92 kg/m2 33 kg/m2 32.94 kg/m2 33.18 kg/m2 32.92 kg/m2 32.76 kg/m2

## 2022-06-14 NOTE — Assessment & Plan Note (Signed)
4 month h/o worsening symptoms , awaken pt rated at 10 with direct pressure, no new weakness or numbness, xray and MRI

## 2022-06-14 NOTE — Progress Notes (Unsigned)
[name]     MRN: [id number]      DOB: October 07, 1975   HPI [name] is here for follow up and re-evaluation of chronic medical conditions, medication management and review of any available recent lab and radiology data.  Preventive health is updated, specifically  Cancer screening and Immunization.   Questions or concerns regarding consultations or procedures which the PT has had in the interim are  addressed.Diabetes doing well The PT denies any adverse reactions to current medications since the last visit.  4 month h/o worsening LUE pain awakens her rated at 5 in the day, but direct pressure gets her up to a 10, denies weakness or numbness, no inciting trauma ROS Denies recent fever or chills. Denies sinus pressure, nasal congestion, ear pain or sore throat. Denies chest congestion, productive cough or wheezing. Denies chest pains, palpitations and leg swelling Denies abdominal pain, nausea, vomiting,diarrhea or constipation.   Denies dysuria, frequency, hesitancy or incontinence. . Denies headaches, seizures, numbness, or tingling. Denies depression, anxiety or insomnia. Denies skin break down or rash.   PE  BP 120/80 (BP Location: Right Arm, Patient Position: Sitting, Cuff Size: Large)   Pulse 86   Ht '5\' 2"'$  (1.575 m)   Wt 178 lb 0.6 oz (80.8 kg)   LMP 04/28/2022   SpO2 97%   BMI 32.56 kg/m   Patient alert and oriented and in no cardiopulmonary distress.  HEENT: No facial asymmetry, EOMI,     Neck supple .  Chest: Clear to auscultation bilaterally.  CVS: S1, S2 no murmurs, no S3.Regular rate.  ABD: Soft non tender.   Ext: No edema  MS: Adequate ROM spine, shoulders, hips and knees.  Skin: Intact, no ulcerations or rash noted.  Psych: Good eye contact, normal affect. Memory intact not anxious or depressed appearing.  CNS: CN 2-12 intact, power,  normal throughout.no focal deficits noted.   Assessment & Plan  Essential hypertension Controlled,  no change in medication DASH diet and commitment to daily physical activity for a minimum of 30 minutes discussed and encouraged, as a part of hypertension management. The importance of attaining a healthy weight is also discussed.     06/14/2022    9:02 AM 03/15/2022   12:54 PM 03/15/2022    9:14 AM 12/28/2021    8:04 AM 11/09/2021    9:07 AM 08/10/2021   10:36 AM 06/29/2021   11:43 AM  BP/Weight  Systolic BP 220  254 270 623 762 831  Diastolic BP 80  82 84 84 88 83  Wt. (Lbs) 178.04 180 180.4 180.08 181.4 180 179.12  BMI 32.56 kg/m2 32.92 kg/m2 33 kg/m2 32.94 kg/m2 33.18 kg/m2 32.92 kg/m2 32.76 kg/m2       Neck pain on left side 4 month h/o worsening symptoms , awaken pt rated at 10 with direct pressure, no new weakness or numbness, xray and MRI  Obesity (BMI 30.0-34.9)  Patient re-educated about  the importance of commitment to a  minimum of 150 minutes of exercise per week as able.  The importance of healthy food choices with portion control discussed, as well as eating regularly and within a 12 hour window most days. The need to choose "clean , green" food 50 to 75% of the time is discussed, as well as to make water the primary drink and set a goal of 64 ounces water daily.       06/14/2022    9:02 AM 03/15/2022   12:54 PM 03/15/2022  9:14 AM  Weight /BMI  Weight 178 lb 0.6 oz 180 lb 180 lb 6.4 oz  Height '5\' 2"'$  (1.575 m)  '5\' 2"'$  (1.575 m)  BMI 32.56 kg/m2 32.92 kg/m2 33 kg/m2      Mixed hyperlipidemia Hyperlipidemia:Low fat diet discussed and encouraged.   Lipid Panel  Lab Results  Component Value Date   CHOL 143 06/12/2022   HDL 39 (L) 06/12/2022   LDLCALC 90 06/12/2022   TRIG 67 06/12/2022   CHOLHDL 3.7 06/12/2022     Increase exercise to improve HDL  Vitamin D deficiency Stop weekly by December, and start OTC 1000 iU daily  Type 2 diabetes mellitus with vascular disease ([place]) Controlled, no change in medication [name] is reminded of the  importance of commitment to daily physical activity for 30 minutes or more, as able and the need to limit carbohydrate intake to 30 to 60 grams per meal to help with blood sugar control.   The need to take medication as prescribed, test blood sugar as directed, and to call between visits if there is a concern that blood sugar is uncontrolled is also discussed.   [name] is reminded of the importance of daily foot exam, annual eye examination, and good blood sugar, blood pressure and cholesterol control.     Latest Ref Rng & Units 06/12/2022    8:07 AM 03/15/2022    9:14 AM 11/09/2021    9:10 AM 09/21/2021    8:20 AM 08/10/2021   10:50 AM  Diabetic Labs  HbA1c 4.0 - 5.6 %  6.6  7.1   7.2   Micro/Creat Ratio 0 - 29 mg/g creat 20       Chol 100 - 199 mg/dL 143    137    HDL >39 mg/dL 39    44    Calc LDL 0 - 99 mg/dL 90    82    Triglycerides 0 - 149 mg/dL 67    47    Creatinine 0.57 - 1.00 mg/dL 1.03    1.03        06/14/2022    9:02 AM 03/15/2022   12:54 PM 03/15/2022    9:14 AM 12/28/2021    8:04 AM 11/09/2021    9:07 AM 08/10/2021   10:36 AM 06/29/2021   11:43 AM  BP/Weight  Systolic BP 585  277 824 235 361 443  Diastolic BP 80  82 84 84 88 83  Wt. (Lbs) 178.04 180 180.4 180.08 181.4 180 179.12  BMI 32.56 kg/m2 32.92 kg/m2 33 kg/m2 32.94 kg/m2 33.18 kg/m2 32.92 kg/m2 32.76 kg/m2      Latest Ref Rng & Units 12/28/2021    8:00 AM 11/08/2021   12:00 AM  Foot/eye exam completion dates  Eye Exam No Retinopathy  No Retinopathy      Foot Form Completion  Done      This result is from an external source.      Managed by Endo

## 2022-06-14 NOTE — Patient Instructions (Addendum)
F/U in  5 months, call if you need me sooner  Flu vaccine today   Fasting lipid, cmp and EGFr, tSH, cBC, vit D 1 week before 5 month f/u  Pls get covid vaccine  Will check re colonoscopy coverage through your ins, case manager referral  Stop weekly vit D, may take the last 2 you have, start once daily vit D3, 1000 iU in December  Xray of neck today and MRI will also ordered, referral made as appropriate   It is important that you exercise regularly at least 30 minutes 5 times a week. If you develop chest pain, have severe difficulty breathing, or feel very tired, stop exercising immediately and seek medical attention   Think about what you will eat, plan ahead. Choose " clean, green, fresh or frozen" over canned, processed or packaged foods which are more sugary, salty and fatty. 70 to 75% of food eaten should be vegetables and fruit. Three meals at set times with snacks allowed between meals, but they must be fruit or vegetables. Aim to eat over a 12 hour period , example 7 am to 7 pm, and STOP after  your last meal of the day. Drink water,generally about 64 ounces per day, no other drink is as healthy. Fruit juice is best enjoyed in a healthy way, by EATING the fruit. Thanks for choosing [facility], we consider it a privelige to serve you.

## 2022-06-14 NOTE — Assessment & Plan Note (Signed)
Stop weekly by December, and start OTC 1000 iU daily

## 2022-06-14 NOTE — Assessment & Plan Note (Signed)
Hyperlipidemia:Low fat diet discussed and encouraged.   Lipid Panel  Lab Results  Component Value Date   CHOL 143 06/12/2022   HDL 39 (L) 06/12/2022   LDLCALC 90 06/12/2022   TRIG 67 06/12/2022   CHOLHDL 3.7 06/12/2022     Increase exercise to improve HDL

## 2022-06-15 ENCOUNTER — Encounter: Payer: Self-pay | Admitting: Family Medicine

## 2022-06-15 NOTE — Assessment & Plan Note (Signed)
Controlled, no change in medication [name] is reminded of the importance of commitment to daily physical activity for 30 minutes or more, as able and the need to limit carbohydrate intake to 30 to 60 grams per meal to help with blood sugar control.   The need to take medication as prescribed, test blood sugar as directed, and to call between visits if there is a concern that blood sugar is uncontrolled is also discussed.   [name] is reminded of the importance of daily foot exam, annual eye examination, and good blood sugar, blood pressure and cholesterol control.     Latest Ref Rng & Units 06/12/2022    8:07 AM 03/15/2022    9:14 AM 11/09/2021    9:10 AM 09/21/2021    8:20 AM 08/10/2021   10:50 AM  Diabetic Labs  HbA1c 4.0 - 5.6 %  6.6  7.1   7.2   Micro/Creat Ratio 0 - 29 mg/g creat 20       Chol 100 - 199 mg/dL 143    137    HDL >39 mg/dL 39    44    Calc LDL 0 - 99 mg/dL 90    82    Triglycerides 0 - 149 mg/dL 67    47    Creatinine 0.57 - 1.00 mg/dL 1.03    1.03        06/14/2022    9:02 AM 03/15/2022   12:54 PM 03/15/2022    9:14 AM 12/28/2021    8:04 AM 11/09/2021    9:07 AM 08/10/2021   10:36 AM 06/29/2021   11:43 AM  BP/Weight  Systolic BP 829  562 130 865 784 696  Diastolic BP 80  82 84 84 88 83  Wt. (Lbs) 178.04 180 180.4 180.08 181.4 180 179.12  BMI 32.56 kg/m2 32.92 kg/m2 33 kg/m2 32.94 kg/m2 33.18 kg/m2 32.92 kg/m2 32.76 kg/m2      Latest Ref Rng & Units 12/28/2021    8:00 AM 11/08/2021   12:00 AM  Foot/eye exam completion dates  Eye Exam No Retinopathy  No Retinopathy      Foot Form Completion  Done      This result is from an external source.      Managed by Endo

## 2022-06-16 ENCOUNTER — Other Ambulatory Visit: Payer: Self-pay | Admitting: Family Medicine

## 2022-06-20 ENCOUNTER — Ambulatory Visit (HOSPITAL_COMMUNITY)
Admission: RE | Admit: 2022-06-20 | Discharge: 2022-06-20 | Disposition: A | Discharge status: Home / Self Care | Payer: Medicaid Other | Source: Ambulatory Visit | Attending: Family Medicine | Admitting: Family Medicine

## 2022-06-20 DIAGNOSIS — M542 Cervicalgia: Secondary | ICD-10-CM | POA: Insufficient documentation

## 2022-06-26 ENCOUNTER — Encounter: Payer: Self-pay | Admitting: Family Medicine

## 2022-06-26 ENCOUNTER — Telehealth: Payer: Self-pay | Admitting: Family Medicine

## 2022-06-26 DIAGNOSIS — M542 Cervicalgia: Secondary | ICD-10-CM

## 2022-06-26 NOTE — Telephone Encounter (Signed)
The note on the colonoscopy referral from May 2023 says "she did not respond to the mail questioner" so they close the referral after 6 months. She did not turn 46 until August so at the time the referral was placed she was 45. Looks like she just needs to contact them and return the packet

## 2022-06-26 NOTE — Telephone Encounter (Signed)
I am asking for your help with this pt. He states managed medicaid that ahe was told ins would not cover her colonscopy due to age. She is 46, please follow through on this for me, also, what is the ref # for managed medicaid to put in a referral? I have also just ordered an mRI of her c spine, can you try to get asap due tpo iuncontrolled pain, x ray not too abn Thanks!

## 2022-06-27 ENCOUNTER — Other Ambulatory Visit: Payer: Self-pay | Admitting: Family Medicine

## 2022-06-27 DIAGNOSIS — Z1211 Encounter for screening for malignant neoplasm of colon: Secondary | ICD-10-CM

## 2022-06-27 NOTE — Telephone Encounter (Signed)
I spoke to GI and pt and she turned the form in before she turned 46 and they told her it may not be covered at that time. Per GI I re entered referral and mailed the packet to pt and she is aware.  The MRI was Not covered without completing 6 weeks of physical therapy

## 2022-06-28 ENCOUNTER — Encounter: Payer: Self-pay | Admitting: Family Medicine

## 2022-06-28 ENCOUNTER — Encounter (INDEPENDENT_AMBULATORY_CARE_PROVIDER_SITE_OTHER): Payer: Self-pay | Admitting: *Deleted

## 2022-06-28 NOTE — Telephone Encounter (Signed)
Spoke with pt advised of Dr Griffin Dakin message pt verbalized understanding

## 2022-07-16 ENCOUNTER — Other Ambulatory Visit: Payer: Self-pay | Admitting: Family Medicine

## 2022-07-17 ENCOUNTER — Telehealth: Payer: Self-pay | Admitting: *Deleted

## 2022-07-17 DIAGNOSIS — Z1211 Encounter for screening for malignant neoplasm of colon: Secondary | ICD-10-CM

## 2022-07-17 MED ORDER — CLENPIQ 10-3.5-12 MG-GM -GM/175ML PO SOLN: 1.0000 | ORAL | 0 refills | Status: DC

## 2022-07-17 NOTE — Addendum Note (Signed)
Addended by: Cheron Every on: 07/17/2022 04:27 PM   Modules accepted: Orders

## 2022-07-17 NOTE — Telephone Encounter (Signed)
Called pt. Scheduled for 1/26 at 7:30am. Aware will send instructions. Rx for prep sent to pharmacy. Reports insurance will be same for next year

## 2022-07-17 NOTE — Telephone Encounter (Signed)
Referring MD/PCP: Dr. Moshe Cipro  Procedure: Colonoscopy  Reason/Indication:  screening  Has patient had this procedure before?  no  If so, when, by whom and where?    Is there a family history of colon cancer?  no  Who?  What age when diagnosed?    Is patient diabetic? If yes, Type 1 or Type 2   yes type 2      Does patient have prosthetic heart valve or mechanical valve?  no  Do you have a pacemaker/defibrillator?  no  Has patient ever had endocarditis/atrial fibrillation? no  Does patient use oxygen? no  Has patient had joint replacement within last 12 months?  no  Is patient constipated or do they take laxatives? Yes sometimes  Does patient have a history of alcohol/drug use?  no  Have you had a stroke/heart attack last 6 mths? no  Do you take medicine for weight loss?  no  For female patients,: have you had a hysterectomy no                      are you post menopausal no                      do you still have your menstrual cycle yes  Is patient on blood thinner such as Coumadin, Plavix and/or Aspirin? no  Medications:  Current Outpatient Medications on File Prior to Visit  Medication Sig Dispense Refill   dapagliflozin propanediol (FARXIGA) 10 MG TABS tablet Take 1 tablet (10 mg total) by mouth daily before breakfast. 90 tablet 3   Glucagon (BAQSIMI ONE PACK) 3 MG/DOSE POWD Use as needed for hypoglycemia 1 each 11   hydrochlorothiazide (HYDRODIURIL) 25 MG tablet TAKE 1 TABLET BY MOUTH EVERY DAY 30 tablet 5   montelukast (SINGULAIR) 10 MG tablet TAKE 1 TABLET BY MOUTH AT BEDTIME 30 tablet 3   MOUNJARO 7.5 MG/0.5ML Pen INJECT 7.5 MG into THE SKIN ONCE a WEEK 2 mL 3   potassium chloride (KLOR-CON M) 10 MEQ tablet TAKE 1 TABLET BY MOUTH DAILY 30 tablet 5   pravastatin (PRAVACHOL) 10 MG tablet TAKE 1 TABLET BY MOUTH AT BEDTIME 30 tablet 1   azelastine (ASTELIN) 0.1 % nasal spray INSTILL TWO SPRAYS IN EACH NOSTRIL TWICE DAILY 30 mL 3   Cholecalciferol (VITAMIN D3) 25  MCG (1000 UT) CAPS Take 1 capsule (1,000 Units total) by mouth daily. 90 capsule 2   Continuous Blood Gluc Transmit (DEXCOM G6 TRANSMITTER) MISC 1 Device by Does not apply route every 3 (three) months. 1 each 3   insulin aspart (NOVOLOG) 100 UNIT/ML injection USE UP TO 110 units a DAY in THE insulin pump 100 mL 1   Insulin Disposable Pump (OMNIPOD DASH PODS, GEN 4,) MISC USE ONE POD EVERY TWO DAYS 45 each 3   No current facility-administered medications on file prior to visit.     Allergies:  Allergies  Allergen Reactions   Ace Inhibitors Cough   Amlodipine Besylate     REACTION: severe edema of legs     [facility]

## 2022-07-17 NOTE — Telephone Encounter (Signed)
Any room, need to adjust Mounjaro Thanks

## 2022-07-18 NOTE — Telephone Encounter (Signed)
Referral completed

## 2022-07-25 ENCOUNTER — Other Ambulatory Visit (HOSPITAL_COMMUNITY): Payer: Self-pay

## 2022-07-26 ENCOUNTER — Ambulatory Visit: Payer: Medicaid Other | Admitting: Internal Medicine

## 2022-07-26 ENCOUNTER — Encounter: Payer: Self-pay | Admitting: Internal Medicine

## 2022-07-26 VITALS — Wt 182.4 lb

## 2022-07-26 DIAGNOSIS — E669 Obesity, unspecified: Secondary | ICD-10-CM

## 2022-07-26 DIAGNOSIS — E1159 Type 2 diabetes mellitus with other circulatory complications: Secondary | ICD-10-CM

## 2022-07-26 DIAGNOSIS — E782 Mixed hyperlipidemia: Secondary | ICD-10-CM | POA: Diagnosis not present

## 2022-07-26 LAB — POCT GLYCOSYLATED HEMOGLOBIN (HGB A1C): Hemoglobin A1C: 6.5 % — AB (ref 4.0–5.6)

## 2022-07-26 MED ORDER — BAQSIMI ONE PACK 3 MG/DOSE NA POWD: NASAL | 11 refills | Status: AC

## 2022-07-26 MED ORDER — DEXCOM G7 SENSOR MISC: 3.0000 | 4 refills | Status: DC

## 2022-07-26 MED ORDER — DAPAGLIFLOZIN PROPANEDIOL 10 MG PO TABS: 10.0000 mg | ORAL_TABLET | Freq: Every day | ORAL | 3 refills | Status: DC

## 2022-07-26 MED ORDER — MOUNJARO 7.5 MG/0.5ML ~~LOC~~ SOAJ: SUBCUTANEOUS | 3 refills | Status: DC

## 2022-07-26 NOTE — Progress Notes (Signed)
Patient ID: [name], female   DOB: January 19, 1976, 46 y.o.   MRN: [id number]   HPI: [name] is a 46 y.o.-year-old female, returning for f/u for DM2, dx in 2005, insulin-dependent since 2018, uncontrolled, without long-term complications.  She previously saw Dr. Dorris Fetch. First visit with me 02/2018.  Last visit 4 months ago.  Interim history: No increased urination, blurry vision, nausea, chest pain. Before last visit, she started exercising consistently.  Reviewed HbA1c levels: Lab Results  Component Value Date   HGBA1C 6.6 (A) 03/15/2022   HGBA1C 7.1 (A) 11/09/2021   HGBA1C 7.2 (A) 08/10/2021   She was previously on:  - Farxiga 5 mg daily in am - started 12/2018 >> 10 mg daily in a.m. - Ozempic - started 02/2018 >> 1 mg >> 2 mg weekly - Lantus 60 >> 66 units at bedtime >> 30 units in a.m. and 40 units at bedtime - Novolog 12 units 3x a day before meals -started 11/2020 >> 12 to 15 units before each meal Also, she was on Metformin ER 1000 mg twice a day with meals - started 05/2018 >> GI intolerance She stopped Invokana b/c yeast inf's. She stopped Janumet due to abdominal pain and diarrhea. Stopped Glipizide when we started Novolog.  Insulin pump: OmniPod DASH   CGM: Dexcom G6 - [place] approved 08/08/2021  Insulin: NovoLog >> Aspart  Supplies: Pharmacy  Pump settings: - basal rates: 12 am: 2 >> 2.2 units/h - target: 120-120 - ICR 1:6 - ISF: 25 - Insulin on Board: 4h TDD from basal insulin: 48 units (55%) >> 69% >> 63% >> 68-80% >> 52.8 units (69-91%) TDD from bolus insulin: 37 units (45%) >> 31% >> 27% >> 20-32% >> 9-31% Total daily dose 70-100 units daily - extended bolusing: not using - changes infusion site: q3 days  Also: - Farxiga 10 mg daily in a.m. - Ozempic 2 mg weekly >> Mounjaro 7.5 mg weekly  Pt checks her sugars 4x a day with her Dexcom:   Previously:   Previously:   Lowest sugar was 69 - pm ... >> 80 >> 80 >> 70; she has  hypoglycemia awareness in the 70s. Highest sugar was >350 >> 200s >> 200s >> 260.  Continues to walk for exercise.  Glucometer: Truemetrix   Pt's meals are: - Breakfast: cereal + milk, muffin + fruit - Lunch: salad, sandwich, hamburger - Dinner: meat + veggies  - Snacks: fruit, crackers  -no CKD, last BUN/creatinine:  Lab Results  Component Value Date   BUN 12 06/12/2022   BUN 17 09/21/2021   CREATININE 1.03 (H) 06/12/2022   CREATININE 1.03 (H) 09/21/2021  Allergic to ACEI.  + Hyperlipidemia; last set of lipids: Lab Results  Component Value Date   CHOL 143 06/12/2022   HDL 39 (L) 06/12/2022   LDLCALC 90 06/12/2022   TRIG 67 06/12/2022   CHOLHDL 3.7 06/12/2022  On pravastatin 10.  - last eye exam was 11/08/2021: No DR reportedly. [facility].  Marland Kitchen no numbness and tingling in her feet.  Latest foot exam: 12/28/2021  Pt has FH of DM in M.  ROS: + see HPI  I reviewed pt's medications, allergies, PMH, social hx, family hx, and changes were documented in the history of present illness. Otherwise, unchanged from my initial visit note.  Past Medical History:  Diagnosis Date   Constipation    GERD (gastroesophageal reflux disease)    Hypertension    Morbid obesity (HCC)    NIDDM (  non-insulin dependent diabetes mellitus) 2005   Type II   OSA (obstructive sleep apnea) 06/24/2018   Past Surgical History:  Procedure Laterality Date   BACK SURGERY  05/25/2010   Dr,Kabell    CESAREAN SECTION  6/09, 2015   LUMBAR LAMINECTOMY/DECOMPRESSION MICRODISCECTOMY Left 09/13/2015   Procedure: Left Lumbar five-Sacral one microdiskectomy;  Surgeon: Ashok Pall, MD;  Location: [facility];  Service: Neurosurgery;  Laterality: Left;  Left Lumbar five-Sacral one microdiskectomy   Social History   Socioeconomic History   Marital status: Married    Spouse name: Not on file   Number of children: 2   Years of education: Not on file   Highest education level: Not on file  Occupational  History   Occupation: unemployed   Tobacco Use   Smoking status: Never Smoker   Smokeless tobacco: Never Used  Substance and Sexual Activity   Alcohol use: No   Drug use: No   Current Outpatient Medications on File Prior to Visit  Medication Sig Dispense Refill   azelastine (ASTELIN) 0.1 % nasal spray INSTILL TWO SPRAYS IN EACH NOSTRIL TWICE DAILY 30 mL 3   Cholecalciferol (VITAMIN D3) 25 MCG (1000 UT) CAPS Take 1 capsule (1,000 Units total) by mouth daily. 90 capsule 2   Continuous Blood Gluc Transmit (DEXCOM G6 TRANSMITTER) MISC 1 Device by Does not apply route every 3 (three) months. 1 each 3   dapagliflozin propanediol (FARXIGA) 10 MG TABS tablet Take 1 tablet (10 mg total) by mouth daily before breakfast. 90 tablet 3   Glucagon (BAQSIMI ONE PACK) 3 MG/DOSE POWD Use as needed for hypoglycemia 1 each 11   hydrochlorothiazide (HYDRODIURIL) 25 MG tablet TAKE 1 TABLET BY MOUTH EVERY DAY 30 tablet 5   insulin aspart (NOVOLOG) 100 UNIT/ML injection USE UP TO 110 units a DAY in THE insulin pump 100 mL 1   Insulin Disposable Pump (OMNIPOD DASH PODS, GEN 4,) MISC USE ONE POD EVERY TWO DAYS 45 each 3   montelukast (SINGULAIR) 10 MG tablet TAKE 1 TABLET BY MOUTH AT BEDTIME 30 tablet 3   MOUNJARO 7.5 MG/0.5ML Pen INJECT 7.5 MG into THE SKIN ONCE a WEEK 2 mL 3   potassium chloride (KLOR-CON M) 10 MEQ tablet TAKE 1 TABLET BY MOUTH DAILY 30 tablet 5   pravastatin (PRAVACHOL) 10 MG tablet TAKE 1 TABLET BY MOUTH AT BEDTIME 30 tablet 1   Sod Picosulfate-Mag Ox-Cit Acd (CLENPIQ) 10-3.5-12 MG-GM -GM/175ML SOLN Take 1 kit by mouth as directed. 350 mL 0   No current facility-administered medications on file prior to visit.   Allergies  Allergen Reactions   Ace Inhibitors Cough   Amlodipine Besylate     REACTION: severe edema of legs   Family History  Problem Relation Age of Onset   Diabetes Father    Hypertension Father    Kidney disease Father    Kidney failure Father    Diabetes Mother     Hypertension Mother    PE: Wt 182 lb 6.4 oz (82.7 kg)   BMI 33.36 kg/m  Wt Readings from Last 3 Encounters:  07/26/22 182 lb 6.4 oz (82.7 kg)  06/14/22 178 lb 0.6 oz (80.8 kg)  03/15/22 180 lb (81.6 kg)   Constitutional: overweight, in NAD Eyes: no exophthalmos ENT: no masses palpated in neck, no cervical lymphadenopathy Cardiovascular: RRR, No MRG Respiratory: CTA B Musculoskeletal: no deformities Skin: no rashes Neurological: no tremor with outstretched hands  ASSESSMENT: 1. DM2, insulin-dependent, uncontrolled, without long-term complications, but  with hyperglycemia  2. HL  3. Obesity class I  PLAN:  1. Patient with longstanding, previously uncontrolled, type 2 diabetes, on a complex medication regimen including SGLT2 inhibitor, GLP-1/GIP receptor agonist and also OmniPod insulin pump + Dexcom G6 CGM.  Her sugars improved significantly after starting insulin pump and also after starting Mounjaro. -At last visit, sugars were fluctuating in the higher half of the target range with slight increase in blood sugars after breakfast and lunch but better blood sugars after dinner.  The improvement in blood sugars after dinner was likely related to walking in the evening.  I advised her to continue with this. CGM interpretation: -At today's visit, we reviewed her CGM downloads: It appears that 76% of values are in target range (goal >70%), while 24% are higher than 180 (goal <25%), and 0% are lower than 70 (goal <4%).  The calculated average blood sugar is 156.  The projected HbA1c for the next 3 months (GMI) is 7.0%. -Reviewing the CGM trends, sugars are fluctuating in the upper half of the target range, with occasional mildly hyperglycemic spikes especially after dinner.  Reviewing her pump downloads, however, she is not entering carbs consistently before meals, and when she is, she most frequently enters between 20 to 30 g of carbs.  As she is already on [place], this works  fairly well for breakfast and lunch, but not for dinner, for which she is mainly does not enter any carbs.  I strongly advised her to also enter carbs for dinner, but for now, I do not think we need to change her regimen. -I refilled her prescriptions today - I suggested to:  Patient Instructions  Please continue: - Farxiga 10 mg before b'fast - Mounjaro 7.5 mg weekly  Also: - basal rates: 12 am: 2.2 units/h - target: 120-120 - ICR 1:6 - ISF: 25 - Insulin on Board: 4h  Please return in 4 months.  - we checked her HbA1c: 6.5% (slightly lower) - advised to check sugars at different times of the day - 4x a day, rotating check times - advised for yearly eye exams >> she is UTD - return to clinic in 3-4 months  2. HL -Reviewed latest lipid panel from 06/2022: Fractions at goal with the exception of a slightly low HDL: Lab Results  Component Value Date   CHOL 143 06/12/2022   HDL 39 (L) 06/12/2022   LDLCALC 90 06/12/2022   TRIG 67 06/12/2022   CHOLHDL 3.7 06/12/2022  -She continues on pravastatin 10 mg daily without side effects  3. Obesity class I -continue SGLT 2 inhibitor and GLP-1/GIP receptor agonist which should also help with weight loss -She lost 17 pounds immediately after starting Ozempic and maintained the weight loss after switching to [facility] -At last visit, weight was stable -she gained 2 lbs since then  Philemon Kingdom, MD PhD [facility]

## 2022-07-26 NOTE — Patient Instructions (Signed)
Please continue: - Farxiga 10 mg before b'fast - Mounjaro 7.5 mg weekly  Also: - basal rates: 12 am: 2.2 units/h - target: 120-120 - ICR 1:6 - ISF: 25 - Insulin on Board: 4h  Please return in 4 months.

## 2022-08-16 ENCOUNTER — Telehealth: Payer: Self-pay | Admitting: Family Medicine

## 2022-08-16 DIAGNOSIS — M542 Cervicalgia: Secondary | ICD-10-CM

## 2022-08-16 NOTE — Telephone Encounter (Signed)
States till having left arm pain  up to a 10 at least 3 days /week , no inciting trauma, most time an 8 , radiates to arms , fingers  and to foot at times, c/o weakness 1 month, not in leg, no numbness, will see if peer to peer may change decision or refer for pT in [place]

## 2022-09-02 NOTE — Telephone Encounter (Signed)
Received call from pt requesting her instructions be resent to her in [place]. I have done so and pt aware.

## 2022-09-05 ENCOUNTER — Encounter: Payer: Self-pay | Admitting: Internal Medicine

## 2022-09-05 ENCOUNTER — Other Ambulatory Visit: Payer: Self-pay | Admitting: Family Medicine

## 2022-09-05 ENCOUNTER — Other Ambulatory Visit (HOSPITAL_COMMUNITY)
Admission: RE | Admit: 2022-09-05 | Discharge: 2022-09-05 | Disposition: A | Discharge status: Home / Self Care | Payer: Medicaid Other | Source: Ambulatory Visit | Attending: Gastroenterology | Admitting: Gastroenterology

## 2022-09-05 DIAGNOSIS — E1159 Type 2 diabetes mellitus with other circulatory complications: Secondary | ICD-10-CM

## 2022-09-05 DIAGNOSIS — Z1211 Encounter for screening for malignant neoplasm of colon: Secondary | ICD-10-CM | POA: Diagnosis not present

## 2022-09-05 LAB — BASIC METABOLIC PANEL
Anion gap: 10 (ref 5–15)
BUN: 15 mg/dL (ref 6–20)
CO2: 28 mmol/L (ref 22–32)
Calcium: 8.3 mg/dL — ABNORMAL LOW (ref 8.9–10.3)
Chloride: 98 mmol/L (ref 98–111)
Creatinine, Ser: 1.05 mg/dL — ABNORMAL HIGH (ref 0.44–1.00)
GFR, Estimated: >60 mL/min (ref >60)
Glucose, Bld: 111 mg/dL — ABNORMAL HIGH (ref 70–99)
Potassium: 3.2 mmol/L — ABNORMAL LOW (ref 3.5–5.1)
Sodium: 136 mmol/L (ref 135–145)

## 2022-09-05 LAB — PREGNANCY, URINE: Preg Test, Ur: NEGATIVE

## 2022-09-06 ENCOUNTER — Ambulatory Visit (HOSPITAL_COMMUNITY)
Admission: RE | Admit: 2022-09-06 | Discharge: 2022-09-06 | Disposition: A | Discharge status: Home / Self Care | Payer: Medicaid Other | Attending: Gastroenterology | Admitting: Gastroenterology

## 2022-09-06 ENCOUNTER — Other Ambulatory Visit: Payer: Self-pay

## 2022-09-06 ENCOUNTER — Encounter: Payer: Self-pay | Admitting: Family Medicine

## 2022-09-06 ENCOUNTER — Ambulatory Visit (HOSPITAL_COMMUNITY): Payer: Medicaid Other | Admitting: Anesthesiology

## 2022-09-06 ENCOUNTER — Ambulatory Visit (HOSPITAL_BASED_OUTPATIENT_CLINIC_OR_DEPARTMENT_OTHER): Payer: Medicaid Other | Admitting: Anesthesiology

## 2022-09-06 ENCOUNTER — Encounter (HOSPITAL_COMMUNITY): Payer: Self-pay | Admitting: Gastroenterology

## 2022-09-06 ENCOUNTER — Encounter (HOSPITAL_COMMUNITY)
Admission: RE | Disposition: A | Discharge status: Home / Self Care | Payer: Self-pay | Source: Home / Self Care | Attending: Gastroenterology

## 2022-09-06 DIAGNOSIS — Z833 Family history of diabetes mellitus: Secondary | ICD-10-CM | POA: Insufficient documentation

## 2022-09-06 DIAGNOSIS — K649 Unspecified hemorrhoids: Secondary | ICD-10-CM | POA: Insufficient documentation

## 2022-09-06 DIAGNOSIS — E119 Type 2 diabetes mellitus without complications: Secondary | ICD-10-CM | POA: Insufficient documentation

## 2022-09-06 DIAGNOSIS — Z139 Encounter for screening, unspecified: Secondary | ICD-10-CM | POA: Diagnosis not present

## 2022-09-06 DIAGNOSIS — D124 Benign neoplasm of descending colon: Secondary | ICD-10-CM | POA: Insufficient documentation

## 2022-09-06 DIAGNOSIS — Z6832 Body mass index (BMI) 32.0-32.9, adult: Secondary | ICD-10-CM | POA: Diagnosis not present

## 2022-09-06 DIAGNOSIS — Z8249 Family history of ischemic heart disease and other diseases of the circulatory system: Secondary | ICD-10-CM | POA: Diagnosis not present

## 2022-09-06 DIAGNOSIS — Z1211 Encounter for screening for malignant neoplasm of colon: Secondary | ICD-10-CM

## 2022-09-06 DIAGNOSIS — K635 Polyp of colon: Secondary | ICD-10-CM

## 2022-09-06 DIAGNOSIS — K5730 Diverticulosis of large intestine without perforation or abscess without bleeding: Secondary | ICD-10-CM

## 2022-09-06 DIAGNOSIS — G4733 Obstructive sleep apnea (adult) (pediatric): Secondary | ICD-10-CM | POA: Insufficient documentation

## 2022-09-06 DIAGNOSIS — I10 Essential (primary) hypertension: Secondary | ICD-10-CM | POA: Insufficient documentation

## 2022-09-06 DIAGNOSIS — Z7984 Long term (current) use of oral hypoglycemic drugs: Secondary | ICD-10-CM | POA: Diagnosis not present

## 2022-09-06 DIAGNOSIS — Z794 Long term (current) use of insulin: Secondary | ICD-10-CM | POA: Insufficient documentation

## 2022-09-06 DIAGNOSIS — E1169 Type 2 diabetes mellitus with other specified complication: Secondary | ICD-10-CM

## 2022-09-06 DIAGNOSIS — K219 Gastro-esophageal reflux disease without esophagitis: Secondary | ICD-10-CM | POA: Diagnosis not present

## 2022-09-06 HISTORY — PX: POLYPECTOMY: SHX5525

## 2022-09-06 HISTORY — PX: COLONOSCOPY WITH PROPOFOL: SHX5780

## 2022-09-06 LAB — GLUCOSE, CAPILLARY
Glucose-Capillary: 92 mg/dL (ref 70–99)
Glucose-Capillary: 83 mg/dL (ref 70–99)

## 2022-09-06 LAB — HM COLONOSCOPY

## 2022-09-06 SURGERY — COLONOSCOPY WITH PROPOFOL
Anesthesia: General

## 2022-09-06 MED ORDER — MISC. DEVICES KIT: 1.0000 | PACK | Freq: Every day | 0 refills | Status: AC

## 2022-09-06 MED ORDER — BLOOD GLUCOSE METER KIT: PACK | 0 refills | Status: AC

## 2022-09-06 MED ORDER — PROPOFOL 500 MG/50ML IV EMUL
INTRAVENOUS | Status: DC | PRN
  Administered 2022-09-06: 100 ug/kg/min via INTRAVENOUS

## 2022-09-06 MED ORDER — MISC. DEVICES KIT: 1.0000 | PACK | Freq: Every day | 0 refills | Status: DC

## 2022-09-06 MED ORDER — LACTATED RINGERS IV SOLN: INTRAVENOUS | Status: DC

## 2022-09-06 MED ORDER — PROPOFOL 10 MG/ML IV BOLUS
INTRAVENOUS | Status: DC | PRN
  Administered 2022-09-06 (×2): 40 mg via INTRAVENOUS
  Administered 2022-09-06: 100 mg via INTRAVENOUS

## 2022-09-06 NOTE — H&P (Signed)
[name] is an 47 y.o. female.   Chief Complaint: screening colonoscopy HPI: 47 y/o F with PMH GERD, obesity, diabetes, OSA, hypertension, coming for screening colonoscopy. The patient has never had a colonoscopy in the past.  The patient denies having any complaints such as melena, hematochezia, abdominal pain or distention, change in her bowel movement consistency or frequency, no changes in weight recently.  No family history of colorectal cancer.   Past Medical History:  Diagnosis Date   Constipation    GERD (gastroesophageal reflux disease)    Hypertension    Morbid obesity ([place])    NIDDM (non-insulin dependent diabetes mellitus) 2005   Type II   OSA (obstructive sleep apnea) 06/24/2018    Past Surgical History:  Procedure Laterality Date   BACK SURGERY  05/25/2010   Dr,Kabell    CESAREAN SECTION  6/09, 2015   LUMBAR LAMINECTOMY/DECOMPRESSION MICRODISCECTOMY Left 09/13/2015   Procedure: Left Lumbar five-Sacral one microdiskectomy;  Surgeon: Ashok Pall, MD;  Location: [facility];  Service: Neurosurgery;  Laterality: Left;  Left Lumbar five-Sacral one microdiskectomy    Family History  Problem Relation Age of Onset   Diabetes Father    Hypertension Father    Kidney disease Father    Kidney failure Father    Diabetes Mother    Hypertension Mother    Social History:  reports that she has never smoked. She has never used smokeless tobacco. She reports that she does not drink alcohol and does not use drugs.  Allergies:  Allergies  Allergen Reactions   Ace Inhibitors Cough   Amlodipine Besylate     REACTION: severe edema of legs    Medications Prior to Admission  Medication Sig Dispense Refill   dapagliflozin propanediol (FARXIGA) 10 MG TABS tablet Take 1 tablet (10 mg total) by mouth daily before breakfast. 90 tablet 3   Glucagon (BAQSIMI ONE PACK) 3 MG/DOSE POWD Use as needed for hypoglycemia 1 each 11   hydrochlorothiazide (HYDRODIURIL) 25 MG tablet TAKE  1 TABLET BY MOUTH EVERY DAY 30 tablet 5   insulin aspart (NOVOLOG) 100 UNIT/ML injection USE UP TO 110 units a DAY in THE insulin pump 100 mL 1   Insulin Disposable Pump (OMNIPOD DASH PODS, GEN 4,) MISC USE ONE POD EVERY TWO DAYS 45 each 3   montelukast (SINGULAIR) 10 MG tablet TAKE 1 TABLET BY MOUTH AT BEDTIME 30 tablet 3   potassium chloride (KLOR-CON M) 10 MEQ tablet TAKE 1 TABLET BY MOUTH DAILY 30 tablet 5   pravastatin (PRAVACHOL) 10 MG tablet TAKE 1 TABLET BY MOUTH AT BEDTIME 30 tablet 1   tirzepatide (MOUNJARO) 7.5 MG/0.5ML Pen INJECT 7.5 MG into THE SKIN ONCE a WEEK 6 mL 3   Continuous Blood Gluc Sensor (DEXCOM G7 SENSOR) MISC 3 each by Does not apply route every 30 (thirty) days. Apply 1 sensor every 10 days 9 each 4   glucose blood (ACCU-CHEK GUIDE) test strip USE AS DIRECTED UP TO THREE TIMES DAILY 100 strip 5   ibuprofen (ADVIL) 800 MG tablet Take 800 mg by mouth every 8 (eight) hours as needed for cramping.     Sod Picosulfate-Mag Ox-Cit Acd (CLENPIQ) 10-3.5-12 MG-GM -GM/175ML SOLN Take 1 kit by mouth as directed. 350 mL 0    Results for orders placed or performed during the hospital encounter of 09/06/22 (from the past 48 hour(s))  Glucose, capillary     Status: None   Collection Time: 09/06/22  6:58 AM  Result Value Ref  Range   Glucose-Capillary 92 70 - 99 mg/dL    Comment: Glucose reference range applies only to samples taken after fasting for at least 8 hours.   No results found.  Review of Systems  All other systems reviewed and are negative.   Blood pressure 121/73, pulse 91, temperature 98.8 F (37.1 C), temperature source Oral, resp. rate 20, height '5\' 2"'$  (1.575 m), weight 81.6 kg, last menstrual period 09/05/2022, SpO2 98 %. Physical Exam  GENERAL: The patient is AO x3, in no acute distress. HEENT: Head is normocephalic and atraumatic. EOMI are intact. Mouth is well hydrated and without lesions. NECK: Supple. No masses LUNGS: Clear to auscultation. No presence of  rhonchi/wheezing/rales. Adequate chest expansion HEART: RRR, normal s1 and s2. ABDOMEN: Soft, nontender, no guarding, no peritoneal signs, and nondistended. BS +. No masses. EXTREMITIES: Without any cyanosis, clubbing, rash, lesions or edema. NEUROLOGIC: AOx3, no focal motor deficit. SKIN: no jaundice, no rashes  Assessment/Plan  47 y/o F with PMH GERD, obesity, diabetes, OSA, hypertension, coming for screening colonoscopy. The patient is at average risk for colorectal cancer.  We will proceed with colonoscopy today.   Harvel Quale, MD 09/06/2022, 7:25 AM

## 2022-09-06 NOTE — Telephone Encounter (Signed)
Patient aware sent electronically and manually faxed to

## 2022-09-06 NOTE — Anesthesia Preprocedure Evaluation (Addendum)
Anesthesia Evaluation  Patient identified by MRN, date of birth, ID band Patient awake    Reviewed: Allergy & Precautions, H&P , NPO status , Patient's Chart, lab work & pertinent test results  Airway Mallampati: III  TM Distance: >3 FB Neck ROM: Full    Dental  (+) Dental Advisory Given, Teeth Intact   Pulmonary sleep apnea    Pulmonary exam normal breath sounds clear to auscultation       Cardiovascular Exercise Tolerance: Good hypertension, Pt. on medications Normal cardiovascular exam Rhythm:Regular Rate:Normal     Neuro/Psych negative neurological ROS  negative psych ROS   GI/Hepatic Neg liver ROS,GERD  Medicated and Controlled,,  Endo/Other  diabetes, Well Controlled, Type 2, Oral Hypoglycemic Agents, Insulin Dependent    Renal/GU negative Renal ROS  negative genitourinary   Musculoskeletal negative musculoskeletal ROS (+)    Abdominal   Peds negative pediatric ROS (+)  Hematology negative hematology ROS (+)   Anesthesia Other Findings   Reproductive/Obstetrics negative OB ROS                             Anesthesia Physical Anesthesia Plan  ASA: 2  Anesthesia Plan: General   Post-op Pain Management: Minimal or no pain anticipated   Induction: Intravenous  PONV Risk Score and Plan: 1 and Propofol infusion  Airway Management Planned: Nasal Cannula and Natural Airway  Additional Equipment:   Intra-op Plan:   Post-operative Plan:   Informed Consent: I have reviewed the patients History and Physical, chart, labs and discussed the procedure including the risks, benefits and alternatives for the proposed anesthesia with the patient or authorized representative who has indicated his/her understanding and acceptance.     Dental advisory given  Plan Discussed with: CRNA and Surgeon  Anesthesia Plan Comments:        Anesthesia Quick Evaluation

## 2022-09-06 NOTE — Anesthesia Procedure Notes (Signed)
Date/Time: 09/06/2022 7:33 AM  Performed by: Vista Deck, CRNAPre-anesthesia Checklist: Patient identified, Emergency Drugs available, Suction available, Timeout performed and Patient being monitored Patient Re-evaluated:Patient Re-evaluated prior to induction Oxygen Delivery Method: Nasal Cannula

## 2022-09-06 NOTE — Anesthesia Postprocedure Evaluation (Signed)
Anesthesia Post Note  Patient: [name]  Procedure(s) Performed: COLONOSCOPY WITH PROPOFOL POLYPECTOMY  Patient location during evaluation: Phase II Anesthesia Type: General Level of consciousness: awake and alert and oriented Pain management: pain level controlled Vital Signs Assessment: post-procedure vital signs reviewed and stable Respiratory status: spontaneous breathing, nonlabored ventilation and respiratory function stable Cardiovascular status: blood pressure returned to baseline and stable Postop Assessment: no apparent nausea or vomiting Anesthetic complications: no  No notable events documented.   Last Vitals:  Vitals:   09/06/22 0700 09/06/22 0803  BP: 121/73 (!) 101/56  Pulse: 91 97  Resp: 20 (!) 24  Temp: 37.1 C 36.8 C  SpO2: 98% 96%    Last Pain:  Vitals:   09/06/22 0803  TempSrc: Oral  PainSc: 0-No pain                 [name] C Kionte Baumgardner

## 2022-09-06 NOTE — Discharge Instructions (Signed)
You are being discharged to home.  Resume your previous diet.  We are waiting for your pathology results.  Your physician has recommended a repeat colonoscopy for surveillance based on pathology results.  

## 2022-09-06 NOTE — Op Note (Signed)
[facility] Patient Name: [name] Procedure Date: 09/06/2022 7:05 AM MRN: [id number] Date of Birth: 1976-07-21 Attending MD: Maylon Peppers , , [id number] CSN: [id number] Age: 47 Admit Type: Outpatient Procedure:                Colonoscopy Indications:              Screening for colorectal malignant neoplasm Providers:                Maylon Peppers, Rosina Lowenstein, RN, Aram Candela Referring MD:              Medicines:                Monitored Anesthesia Care Complications:            No immediate complications. Estimated Blood Loss:     Estimated blood loss: none. Procedure:                Pre-Anesthesia Assessment:                           - Prior to the procedure, a History and Physical                            was performed, and patient medications, allergies                            and sensitivities were reviewed. The patient's                            tolerance of previous anesthesia was reviewed.                           - The risks and benefits of the procedure and the                            sedation options and risks were discussed with the                            patient. All questions were answered and informed                            consent was obtained.                           - ASA Grade Assessment: II - A patient with mild                            systemic disease.                           After obtaining informed consent, the colonoscope                            was passed under direct vision. Throughout the                            procedure, the patient's blood  pressure, pulse, and                            oxygen saturations were monitored continuously. The                            [id number]-[id number] ([id number]) was introduced through the                            anus and advanced to the the cecum, identified by                            appendiceal orifice and ileocecal valve. The                            colonoscopy  was performed without difficulty. The                            patient tolerated the procedure well. The quality                            of the bowel preparation was good. Scope In: 7:38:22 AM Scope Out: 7:58:43 AM Scope Withdrawal Time: 0 hours 13 minutes 44 seconds  Total Procedure Duration: 0 hours 20 minutes 21 seconds  Findings:      Hemorrhoids were found on perianal exam.      A 3 mm polyp was found in the descending colon. The polyp was sessile.       The polyp was removed with a cold snare. Resection and retrieval were       complete.      Scattered small-mouthed diverticula were found in the sigmoid colon.      The retroflexed view of the distal rectum and anal verge was normal and       showed no anal or rectal abnormalities. Impression:               - Hemorrhoids found on perianal exam.                           - One 3 mm polyp in the descending colon, removed                            with a cold snare. Resected and retrieved.                           - Diverticulosis in the sigmoid colon.                           - The distal rectum and anal verge are normal on                            retroflexion view. Moderate Sedation:      Per Anesthesia Care Recommendation:           - Discharge patient to home (ambulatory).                           -  Resume previous diet.                           - Await pathology results.                           - Repeat colonoscopy for surveillance based on                            pathology results. Procedure Code(s):        --- Professional ---                           [phone], Colonoscopy, flexible; with removal of                            tumor(s), polyp(s), or other lesion(s) by snare                            technique Diagnosis Code(s):        --- Professional ---                           Z12.11, Encounter for screening for malignant                            neoplasm of colon                           D12.4,  Benign neoplasm of descending colon                           K64.9, Unspecified hemorrhoids                           K57.30, Diverticulosis of large intestine without                            perforation or abscess without bleeding CPT copyright 2022 American Medical Association. All rights reserved. The codes documented in this report are preliminary and upon coder review may  be revised to meet current compliance requirements. Maylon Peppers, MD Maylon Peppers,  09/06/2022 8:03:02 AM This report has been signed electronically. Number of Addenda: 0

## 2022-09-06 NOTE — Transfer of Care (Addendum)
Immediate Anesthesia Transfer of Care Note  Patient: LOREAN EKSTRAND  Procedure(s) Performed: COLONOSCOPY WITH PROPOFOL POLYPECTOMY  Patient Location: Endoscopy Unit  Anesthesia Type:General  Level of Consciousness: awake and patient cooperative  Airway & Oxygen Therapy: Patient Spontanous Breathing  Post-op Assessment: Report given to RN and Post -op Vital signs reviewed and stable  Post vital signs: Reviewed and stable  Last Vitals:  Vitals Value Taken Time  BP 101/56 0807  Temp 98.3 0807  Pulse 75 0807  Resp 26 0807  SpO2 96 0807    Last Pain:  Vitals:   09/06/22 0733  TempSrc:   PainSc: 0-No pain      Patients Stated Pain Goal: 6 (28/31/51 7616)  Complications: No notable events documented.

## 2022-09-09 ENCOUNTER — Other Ambulatory Visit (HOSPITAL_COMMUNITY): Payer: Self-pay

## 2022-09-09 ENCOUNTER — Telehealth: Payer: Self-pay | Admitting: Pharmacy Technician

## 2022-09-09 ENCOUNTER — Encounter (INDEPENDENT_AMBULATORY_CARE_PROVIDER_SITE_OTHER): Payer: Self-pay | Admitting: *Deleted

## 2022-09-09 LAB — SURGICAL PATHOLOGY

## 2022-09-09 NOTE — Telephone Encounter (Signed)
-----  Message from Lauralyn Primes, [place] sent at 09/05/2022 12:03 PM EST ----- Regarding: PA Can we get an expedited PA for the Dexcom G7. Thank you.

## 2022-09-09 NOTE — Telephone Encounter (Signed)
Pharmacy Patient Advocate Encounter   Received notification from RMA/Staff msgs that prior authorization for Dexcom G7 is required/requested.    PA submitted on 09/09/22 to (ins) [place] [employer] via [place] Case ID: [id number] Status is pending

## 2022-09-11 ENCOUNTER — Encounter (HOSPITAL_COMMUNITY): Payer: Self-pay | Admitting: Gastroenterology

## 2022-09-12 DIAGNOSIS — Z20822 Contact with and (suspected) exposure to covid-19: Secondary | ICD-10-CM | POA: Diagnosis not present

## 2022-09-12 DIAGNOSIS — R059 Cough, unspecified: Secondary | ICD-10-CM | POA: Diagnosis not present

## 2022-09-15 ENCOUNTER — Other Ambulatory Visit: Payer: Self-pay | Admitting: Family Medicine

## 2022-09-30 ENCOUNTER — Other Ambulatory Visit: Payer: Self-pay | Admitting: Family Medicine

## 2022-09-30 NOTE — Telephone Encounter (Signed)
Patient aware.

## 2022-09-30 NOTE — Addendum Note (Signed)
Addended by: Fayrene Helper on: 09/30/2022 03:13 AM   Modules accepted: Orders

## 2022-10-01 ENCOUNTER — Telehealth: Payer: Self-pay | Admitting: Family Medicine

## 2022-10-01 NOTE — Telephone Encounter (Signed)
Patient will call and see who is in network with her ins.

## 2022-10-01 NOTE — Telephone Encounter (Signed)
Shineice called from physical therapy and hand specialist, said received a referral for patient, but patient insurance is out of network with insurance

## 2022-10-07 NOTE — Telephone Encounter (Signed)
Pharmacy Patient Advocate Encounter  Prior Authorization for Dexcom G7 sensors has been approved.    PA#  PA Case ID: [id number] Effective dates: 09/09/22 through 09/09/23  Rx filled 09/09/22

## 2022-10-17 ENCOUNTER — Other Ambulatory Visit: Payer: Self-pay | Admitting: Family Medicine

## 2022-10-28 ENCOUNTER — Encounter: Payer: Self-pay | Admitting: Family Medicine

## 2022-11-13 ENCOUNTER — Ambulatory Visit: Payer: Medicaid Other | Admitting: Family Medicine

## 2022-11-18 ENCOUNTER — Other Ambulatory Visit: Payer: Self-pay | Admitting: Family Medicine

## 2022-11-29 ENCOUNTER — Ambulatory Visit: Payer: Medicaid Other | Admitting: Internal Medicine

## 2022-12-17 ENCOUNTER — Other Ambulatory Visit: Payer: Self-pay | Admitting: Family Medicine

## 2022-12-17 ENCOUNTER — Other Ambulatory Visit: Payer: Self-pay | Admitting: Internal Medicine

## 2022-12-27 DIAGNOSIS — E1159 Type 2 diabetes mellitus with other circulatory complications: Secondary | ICD-10-CM | POA: Diagnosis not present

## 2022-12-27 DIAGNOSIS — E559 Vitamin D deficiency, unspecified: Secondary | ICD-10-CM | POA: Diagnosis not present

## 2022-12-27 DIAGNOSIS — I10 Essential (primary) hypertension: Secondary | ICD-10-CM | POA: Diagnosis not present

## 2022-12-27 DIAGNOSIS — E782 Mixed hyperlipidemia: Secondary | ICD-10-CM | POA: Diagnosis not present

## 2022-12-28 LAB — LIPID PANEL
Chol/HDL Ratio: 3.4 ratio (ref 0.0–4.4)
Cholesterol, Total: 139 mg/dL (ref 100–199)
HDL: 41 mg/dL (ref >39)
LDL Chol Calc (NIH): 85 mg/dL (ref 0–99)
Triglycerides: 64 mg/dL (ref 0–149)
VLDL Cholesterol Cal: 13 mg/dL (ref 5–40)

## 2022-12-28 LAB — CMP14+EGFR
ALT: 26 IU/L (ref 0–32)
AST: 21 IU/L (ref 0–40)
Albumin/Globulin Ratio: 1.3 (ref 1.2–2.2)
Albumin: 4.1 g/dL (ref 3.9–4.9)
Alkaline Phosphatase: 105 IU/L (ref 44–121)
BUN/Creatinine Ratio: 21 (ref 9–23)
BUN: 21 mg/dL (ref 6–24)
Bilirubin Total: 0.4 mg/dL (ref 0.0–1.2)
CO2: 26 mmol/L (ref 20–29)
Calcium: 9.3 mg/dL (ref 8.7–10.2)
Chloride: 98 mmol/L (ref 96–106)
Creatinine, Ser: 1.01 mg/dL — ABNORMAL HIGH (ref 0.57–1.00)
Globulin, Total: 3.2 g/dL (ref 1.5–4.5)
Glucose: 176 mg/dL — ABNORMAL HIGH (ref 70–99)
Potassium: 3.9 mmol/L (ref 3.5–5.2)
Sodium: 140 mmol/L (ref 134–144)
Total Protein: 7.3 g/dL (ref 6.0–8.5)
eGFR: 70 mL/min/{1.73_m2} (ref >59)

## 2022-12-28 LAB — TSH: TSH: 0.761 u[IU]/mL (ref 0.450–4.500)

## 2022-12-28 LAB — CBC
Hematocrit: 38.5 % (ref 34.0–46.6)
Hemoglobin: 12.5 g/dL (ref 11.1–15.9)
MCH: 25.6 pg — ABNORMAL LOW (ref 26.6–33.0)
MCHC: 32.5 g/dL (ref 31.5–35.7)
MCV: 79 fL (ref 79–97)
Platelets: 207 10*3/uL (ref 150–450)
RBC: 4.88 x10E6/uL (ref 3.77–5.28)
RDW: 12.6 % (ref 11.7–15.4)
WBC: 9.6 10*3/uL (ref 3.4–10.8)

## 2022-12-28 LAB — VITAMIN D 25 HYDROXY (VIT D DEFICIENCY, FRACTURES): Vit D, 25-Hydroxy: 48.5 ng/mL (ref 30.0–100.0)

## 2022-12-30 ENCOUNTER — Encounter: Payer: Self-pay | Admitting: Internal Medicine

## 2022-12-30 ENCOUNTER — Other Ambulatory Visit: Payer: Self-pay | Admitting: Internal Medicine

## 2022-12-30 MED ORDER — OZEMPIC (2 MG/DOSE) 8 MG/3ML ~~LOC~~ SOPN: 2.0000 mg | PEN_INJECTOR | SUBCUTANEOUS | 3 refills | Status: DC

## 2023-01-01 ENCOUNTER — Ambulatory Visit: Payer: Medicaid Other | Admitting: Family Medicine

## 2023-01-01 ENCOUNTER — Encounter: Payer: Self-pay | Admitting: Family Medicine

## 2023-01-01 VITALS — BP 129/85 | HR 89 | Ht 62.0 in | Wt 191.1 lb

## 2023-01-01 DIAGNOSIS — M489 Spondylopathy, unspecified: Secondary | ICD-10-CM | POA: Diagnosis not present

## 2023-01-01 DIAGNOSIS — E669 Obesity, unspecified: Secondary | ICD-10-CM

## 2023-01-01 DIAGNOSIS — E1159 Type 2 diabetes mellitus with other circulatory complications: Secondary | ICD-10-CM

## 2023-01-01 DIAGNOSIS — R0602 Shortness of breath: Secondary | ICD-10-CM

## 2023-01-01 DIAGNOSIS — M7989 Other specified soft tissue disorders: Secondary | ICD-10-CM

## 2023-01-01 DIAGNOSIS — Z8249 Family history of ischemic heart disease and other diseases of the circulatory system: Secondary | ICD-10-CM

## 2023-01-01 DIAGNOSIS — I152 Hypertension secondary to endocrine disorders: Secondary | ICD-10-CM

## 2023-01-01 DIAGNOSIS — E559 Vitamin D deficiency, unspecified: Secondary | ICD-10-CM | POA: Diagnosis not present

## 2023-01-01 DIAGNOSIS — E782 Mixed hyperlipidemia: Secondary | ICD-10-CM

## 2023-01-01 DIAGNOSIS — M542 Cervicalgia: Secondary | ICD-10-CM | POA: Diagnosis not present

## 2023-01-01 NOTE — Progress Notes (Signed)
[name]     MRN: [id number]      DOB: 01/18/1976  Chief Complaint  Patient presents with   Follow-up    Follow up    HPI [name] is here for follow up and re-evaluation of chronic medical conditions, medication management and review of any available recent lab and radiology data.  Preventive health is updated, specifically  Cancer screening and Immunization.   Increased and unbearable RUE and lleft upper and lower extremity pain. Lefting RUE is restricted to about 40% unable to raise arm straight up and unable to reach behind back, pain prevents motionc/o bilateral weak grip in hands, drops objects, symptoms over the last 2 weeks, no ionciting trauma Pain in right arm wakes her every night for past 2 weeks. Tylenol arthritis , no relief Full ROM of LUE, left hand weak with tingling of fingers Persistent at 6 in left heel, tennis shoes hurt  has open shoes in both legsIntermittent left anterior thigh pain, after walking or standing for 1 hour pain is unbearable  No new incontinence Has had 2 operations on lumbar spine 2 week h/o bilateral leg swelling and poor exercise tolerance denies PND or orthopnea or chest pain, no palpitations Father had CAD in early to mid 50's ROS Denies recent fever or chills. Denies sinus pressure, nasal congestion, ear pain or sore throat. Denies chest congestion, productive cough or wheezing. Denies chest pains, palpitations and leg swelling Denies abdominal pain, nausea, vomiting,diarrhea or constipation.   Denies dysuria, frequency, hesitancy or incontinence.  C/o  depression and  anxiety .due to health challenge x 6 months Denies polyuria, polydipsia, blurred vision , or hypoglycemic episodes. Unable tp get Mounjaro, states blood sugar is good Denies skin break down or rash.   PE  BP 129/85 (BP Location: Right Arm, Patient Position: Sitting, Cuff Size: Large)   Pulse 89   Ht 5\' 2"  (1.575 m)   Wt 191 lb 1.3 oz (86.7 kg)   SpO2  94%   BMI 34.95 kg/m   Patient alert and oriented and in no cardiopulmonary distress.Uncomfortable and in pain  HEENT: No facial asymmetry, EOMI,     Neck decreased ROM Chest: Clear to auscultation bilaterally.  CVS: S1, S2 no murmurs, no S3.Regular rate.  ABD: Soft non tender.   Ext: No edema  MS: Adequate ROM spine,  hips and knees.  Skin: Intact, no ulcerations or rash noted.  Psych: Good eye contact, normal affect. Memory intact mildly anxious and depressed appearing.  CNS: CN 2-12 intact, grade 4 power in hands, reduced RUE power liomited by severe pain power grade 3 in RUE Assessment & Plan  Cervical spine disease Severe pain with weakness  in upper extremities worsened in past 3 weeks, Urgent MRI and neurosurgery eval  Mixed hyperlipidemia Hyperlipidemia:Low fat diet discussed and encouraged.   Lipid Panel  Lab Results  Component Value Date   CHOL 139 12/27/2022   HDL 41 12/27/2022   LDLCALC 85 12/27/2022   TRIG 64 12/27/2022   CHOLHDL 3.4 12/27/2022     Controlled, no change in medication   Shortness of breath New onset SOB with minimal exertion and leg swelling, positive f/h of premature CAD in father  in mid 80's personal inc risk of CAD due to co morbidities, refer cardiology  Obesity (BMI 30.0-34.9)  Patient re-educated about  the importance of commitment to a  minimum of 150 minutes of exercise per week as able.  The importance of healthy food choices  with portion control discussed, as well as eating regularly and within a 12 hour window most days. The need to choose "clean , green" food 50 to 75% of the time is discussed, as well as to make water the primary drink and set a goal of 64 ounces water daily.       01/01/2023    8:03 AM 09/06/2022    7:00 AM 07/26/2022    9:19 AM  Weight /BMI  Weight 191 lb 1.3 oz 180 lb 182 lb 6.4 oz  Height 5\' 2"  (1.575 m) 5\' 2"  (1.575 m)   BMI 34.95 kg/m2 32.92 kg/m2 33.36 kg/m2    deteriorated  Type 2  diabetes mellitus with vascular disease (HCC) [name] is reminded of the importance of commitment to daily physical activity for 30 minutes or more, as able and the need to limit carbohydrate intake to 30 to 60 grams per meal to help with blood sugar control.   The need to take medication as prescribed, test blood sugar as directed, and to call between visits if there is a concern that blood sugar is uncontrolled is also discussed.   [name] is reminded of the importance of daily foot exam, annual eye examination, and good blood sugar, blood pressure and cholesterol control.     Latest Ref Rng & Units 12/27/2022    9:09 AM 09/05/2022    8:20 AM 07/26/2022    9:38 AM 06/12/2022    8:07 AM 03/15/2022    9:14 AM  Diabetic Labs  HbA1c 4.8 - 5.6 % 7.3   6.5   6.6   Micro/Creat Ratio 0 - 29 mg/g creat    20    Chol 100 - 199 mg/dL 409    811    HDL >91 mg/dL 41    39    Calc LDL 0 - 99 mg/dL 85    90    Triglycerides 0 - 149 mg/dL 64    67    Creatinine 0.57 - 1.00 mg/dL 4.78  2.95   6.21        01/01/2023    8:03 AM 09/06/2022    8:03 AM 09/06/2022    7:00 AM 07/26/2022    9:19 AM 06/14/2022    9:02 AM 03/15/2022   12:54 PM 03/15/2022    9:14 AM  BP/Weight  Systolic BP 129 101 121  120  308  Diastolic BP 85 56 73  80  82  Wt. (Lbs) 191.08  180 182.4 178.04 180 180.4  BMI 34.95 kg/m2  32.92 kg/m2 33.36 kg/m2 32.56 kg/m2 32.92 kg/m2 33 kg/m2      01/01/2023    8:00 AM 12/28/2021    8:00 AM  Foot/eye exam completion dates  Foot Form Completion Done Done      Not as well controlled, unable to get medication Working with Endo, highly motivated to controlling blood sugar  Vitamin D deficiency Corrected , and at goal

## 2023-01-01 NOTE — Patient Instructions (Addendum)
Follow weeks, call if you need it sooner.  You are referred for an urgent MRI of your neck.  You are being referred to cardiology in [place] because of new symptoms of shortness of breath and leg swelling with positive f/h of premature CAD  Please get chest x-ray today at [facility]  Labs are excellent.  Nurse please add HbA1c to the labs recently drawn.  You do need to get your COVID-vaccine.this is overdue  Thanks for choosing [facility], we consider it a privelige to serve you.

## 2023-01-02 ENCOUNTER — Encounter: Payer: Self-pay | Admitting: Internal Medicine

## 2023-01-03 ENCOUNTER — Telehealth: Payer: Self-pay | Admitting: Family Medicine

## 2023-01-03 ENCOUNTER — Ambulatory Visit (HOSPITAL_COMMUNITY)
Admission: RE | Admit: 2023-01-03 | Discharge: 2023-01-03 | Disposition: A | Discharge status: Home / Self Care | Payer: Medicaid Other | Source: Ambulatory Visit | Attending: Family Medicine | Admitting: Family Medicine

## 2023-01-03 ENCOUNTER — Other Ambulatory Visit: Payer: Self-pay

## 2023-01-03 DIAGNOSIS — M542 Cervicalgia: Secondary | ICD-10-CM | POA: Diagnosis not present

## 2023-01-03 DIAGNOSIS — M489 Spondylopathy, unspecified: Secondary | ICD-10-CM | POA: Diagnosis not present

## 2023-01-03 DIAGNOSIS — R0602 Shortness of breath: Secondary | ICD-10-CM

## 2023-01-03 NOTE — Telephone Encounter (Signed)
Enrique Sack, from [facility] called and requested an order for a Chest Xray for pt. Please follow up in regards.

## 2023-01-03 NOTE — Telephone Encounter (Signed)
Chest xray ordered

## 2023-01-04 ENCOUNTER — Encounter: Payer: Self-pay | Admitting: Family Medicine

## 2023-01-04 DIAGNOSIS — R0602 Shortness of breath: Secondary | ICD-10-CM | POA: Insufficient documentation

## 2023-01-04 DIAGNOSIS — R0609 Other forms of dyspnea: Secondary | ICD-10-CM | POA: Insufficient documentation

## 2023-01-04 DIAGNOSIS — M489 Spondylopathy, unspecified: Secondary | ICD-10-CM | POA: Insufficient documentation

## 2023-01-04 NOTE — Assessment & Plan Note (Signed)
New onset SOB with minimal exertion and leg swelling, positive f/h of premature CAD in father  in mid 68's personal inc risk of CAD due to co morbidities, refer cardiology

## 2023-01-04 NOTE — Assessment & Plan Note (Signed)
Corrected , and at goal

## 2023-01-04 NOTE — Assessment & Plan Note (Signed)
Severe pain with weakness  in upper extremities worsened in past 3 weeks, Urgent MRI and neurosurgery eval

## 2023-01-04 NOTE — Assessment & Plan Note (Signed)
[name] is reminded of the importance of commitment to daily physical activity for 30 minutes or more, as able and the need to limit carbohydrate intake to 30 to 60 grams per meal to help with blood sugar control.   The need to take medication as prescribed, test blood sugar as directed, and to call between visits if there is a concern that blood sugar is uncontrolled is also discussed.   [name] is reminded of the importance of daily foot exam, annual eye examination, and good blood sugar, blood pressure and cholesterol control.     Latest Ref Rng & Units 12/27/2022    9:09 AM 09/05/2022    8:20 AM 07/26/2022    9:38 AM 06/12/2022    8:07 AM 03/15/2022    9:14 AM  Diabetic Labs  HbA1c 4.8 - 5.6 % 7.3   6.5   6.6   Micro/Creat Ratio 0 - 29 mg/g creat    20    Chol 100 - 199 mg/dL 161    096    HDL >04 mg/dL 41    39    Calc LDL 0 - 99 mg/dL 85    90    Triglycerides 0 - 149 mg/dL 64    67    Creatinine 0.57 - 1.00 mg/dL 5.40  9.81   1.91        01/01/2023    8:03 AM 09/06/2022    8:03 AM 09/06/2022    7:00 AM 07/26/2022    9:19 AM 06/14/2022    9:02 AM 03/15/2022   12:54 PM 03/15/2022    9:14 AM  BP/Weight  Systolic BP 129 101 121  120  478  Diastolic BP 85 56 73  80  82  Wt. (Lbs) 191.08  180 182.4 178.04 180 180.4  BMI 34.95 kg/m2  32.92 kg/m2 33.36 kg/m2 32.56 kg/m2 32.92 kg/m2 33 kg/m2      01/01/2023    8:00 AM 12/28/2021    8:00 AM  Foot/eye exam completion dates  Foot Form Completion Done Done      Not as well controlled, unable to get medication Working with Endo, highly motivated to controlling blood sugar

## 2023-01-04 NOTE — Assessment & Plan Note (Signed)
Hyperlipidemia:Low fat diet discussed and encouraged.   Lipid Panel  Lab Results  Component Value Date   CHOL 139 12/27/2022   HDL 41 12/27/2022   LDLCALC 85 12/27/2022   TRIG 64 12/27/2022   CHOLHDL 3.4 12/27/2022     Controlled, no change in medication

## 2023-01-04 NOTE — Assessment & Plan Note (Signed)
  Patient re-educated about  the importance of commitment to a  minimum of 150 minutes of exercise per week as able.  The importance of healthy food choices with portion control discussed, as well as eating regularly and within a 12 hour window most days. The need to choose "clean , green" food 50 to 75% of the time is discussed, as well as to make water the primary drink and set a goal of 64 ounces water daily.       01/01/2023    8:03 AM 09/06/2022    7:00 AM 07/26/2022    9:19 AM  Weight /BMI  Weight 191 lb 1.3 oz 180 lb 182 lb 6.4 oz  Height 5\' 2"  (1.575 m) 5\' 2"  (1.575 m)   BMI 34.95 kg/m2 32.92 kg/m2 33.36 kg/m2    deteriorated

## 2023-01-07 ENCOUNTER — Encounter: Payer: Self-pay | Admitting: Family Medicine

## 2023-01-07 ENCOUNTER — Encounter: Payer: Self-pay | Admitting: Internal Medicine

## 2023-01-07 ENCOUNTER — Telehealth: Payer: Self-pay

## 2023-01-07 ENCOUNTER — Other Ambulatory Visit (HOSPITAL_COMMUNITY): Payer: Self-pay

## 2023-01-07 NOTE — Telephone Encounter (Signed)
Patient Advocate Encounter   Received notification from pt msgs that prior authorization is required for Ozempic  Submitted: 01/07/23 [facility]  Status is pending

## 2023-01-09 ENCOUNTER — Encounter: Payer: Self-pay | Admitting: Family Medicine

## 2023-01-09 LAB — SPECIMEN STATUS REPORT

## 2023-01-09 LAB — HEMOGLOBIN A1C
Est. average glucose Bld gHb Est-mCnc: 163 mg/dL
Hgb A1c MFr Bld: 7.3 % — ABNORMAL HIGH (ref 4.8–5.6)

## 2023-01-09 NOTE — Telephone Encounter (Signed)
   Does this mean the referral failed to electronically send?

## 2023-01-10 ENCOUNTER — Ambulatory Visit (HOSPITAL_COMMUNITY)
Admission: RE | Admit: 2023-01-10 | Discharge: 2023-01-10 | Disposition: A | Discharge status: Home / Self Care | Payer: Medicaid Other | Source: Ambulatory Visit | Attending: Family Medicine | Admitting: Family Medicine

## 2023-01-10 DIAGNOSIS — R0602 Shortness of breath: Secondary | ICD-10-CM | POA: Insufficient documentation

## 2023-01-10 NOTE — Telephone Encounter (Signed)
PA has been APPROVED from 01/07/2023-01/07/2024

## 2023-01-13 DIAGNOSIS — M25511 Pain in right shoulder: Secondary | ICD-10-CM | POA: Diagnosis not present

## 2023-01-13 DIAGNOSIS — M4722 Other spondylosis with radiculopathy, cervical region: Secondary | ICD-10-CM | POA: Diagnosis not present

## 2023-01-13 DIAGNOSIS — Z6835 Body mass index (BMI) 35.0-35.9, adult: Secondary | ICD-10-CM | POA: Diagnosis not present

## 2023-01-15 ENCOUNTER — Other Ambulatory Visit: Payer: Self-pay | Admitting: Family Medicine

## 2023-01-24 DIAGNOSIS — R202 Paresthesia of skin: Secondary | ICD-10-CM | POA: Diagnosis not present

## 2023-01-24 DIAGNOSIS — M25511 Pain in right shoulder: Secondary | ICD-10-CM | POA: Diagnosis not present

## 2023-01-24 DIAGNOSIS — M25611 Stiffness of right shoulder, not elsewhere classified: Secondary | ICD-10-CM | POA: Diagnosis not present

## 2023-01-24 DIAGNOSIS — M5382 Other specified dorsopathies, cervical region: Secondary | ICD-10-CM | POA: Diagnosis not present

## 2023-01-24 DIAGNOSIS — Z789 Other specified health status: Secondary | ICD-10-CM | POA: Diagnosis not present

## 2023-01-24 DIAGNOSIS — R531 Weakness: Secondary | ICD-10-CM | POA: Diagnosis not present

## 2023-01-30 ENCOUNTER — Encounter: Payer: Self-pay | Admitting: Internal Medicine

## 2023-01-30 ENCOUNTER — Ambulatory Visit: Payer: Medicaid Other | Attending: Internal Medicine | Admitting: Internal Medicine

## 2023-01-30 VITALS — BP 116/68 | HR 89 | Ht 62.0 in | Wt 196.1 lb

## 2023-01-30 DIAGNOSIS — R0609 Other forms of dyspnea: Secondary | ICD-10-CM

## 2023-01-30 NOTE — Progress Notes (Signed)
Cardiology Office Note  Date: 01/30/2023   ID: [name], DOB [birth year], MRN [id number]  PCP:  Kerri Perches, MD  Cardiologist:  None Electrophysiologist:  None   Reason for Office Visit: Evaluation of DOE at the request of Dr. Lodema Hong   History of Present Illness: [name] is a 47 y.o. female known to have HTN, DM 2, HLD was referred to cardiology clinic for evaluation of DOE.  Patient started to notice DOE after she talked for a long time and also with walking for the last 3 months.  No leg swelling.  No angina, dizziness, palpitations, syncope.  Denies smoking cigarettes.  Compliant with medications and has no side effects.  Past Medical History:  Diagnosis Date   Constipation    GERD (gastroesophageal reflux disease)    Hypertension    Morbid obesity (HCC)    NIDDM (non-insulin dependent diabetes mellitus) 2005   Type II   OSA (obstructive sleep apnea) 06/24/2018    Past Surgical History:  Procedure Laterality Date   BACK SURGERY  05/25/2010   Dr,Kabell    CESAREAN SECTION  6/09, 2015   COLONOSCOPY WITH PROPOFOL N/A 09/06/2022   Procedure: COLONOSCOPY WITH PROPOFOL;  Surgeon: Dolores Frame, MD;  Location: [facility];  Service: Gastroenterology;  Laterality: N/A;  7:30am, asa 1-2   LUMBAR LAMINECTOMY/DECOMPRESSION MICRODISCECTOMY Left 09/13/2015   Procedure: Left Lumbar five-Sacral one microdiskectomy;  Surgeon: Coletta Memos, MD;  Location: [facility];  Service: Neurosurgery;  Laterality: Left;  Left Lumbar five-Sacral one microdiskectomy   POLYPECTOMY  09/06/2022   Procedure: POLYPECTOMY;  Surgeon: Dolores Frame, MD;  Location: [facility];  Service: Gastroenterology;;    Current Outpatient Medications  Medication Sig Dispense Refill   Accu-Chek Softclix Lancets lancets USE AS DIRECTED DAILY 100 each 0   blood glucose meter kit and supplies Dispense based on patient and insurance preference. Test blood  glucose as directed by physician. (FOR ICD-10 E10.9, E11.9). 1 each 0   Continuous Blood Gluc Sensor (DEXCOM G7 SENSOR) MISC 3 each by Does not apply route every 30 (thirty) days. Apply 1 sensor every 10 days 9 each 4   dapagliflozin propanediol (FARXIGA) 10 MG TABS tablet Take 1 tablet (10 mg total) by mouth daily before breakfast. 90 tablet 3   Glucagon (BAQSIMI ONE PACK) 3 MG/DOSE POWD Use as needed for hypoglycemia 1 each 11   glucose blood (ACCU-CHEK GUIDE) test strip USE AS DIRECTED UP TO THREE TIMES DAILY 100 strip 5   hydrochlorothiazide (HYDRODIURIL) 25 MG tablet TAKE 1 TABLET BY MOUTH EVERY DAY 30 tablet 5   ibuprofen (ADVIL) 800 MG tablet Take 800 mg by mouth every 8 (eight) hours as needed for cramping.     insulin aspart (NOVOLOG) 100 UNIT/ML injection USE UP TO 110 units a DAY in THE insulin pump 100 mL 1   Insulin Disposable Pump (OMNIPOD DASH PODS, GEN 4,) MISC USE 1 POD EVERY TWO DAYS 45 each 1   Misc. Devices KIT 1 each by Does not apply route daily. 1 kit 0   montelukast (SINGULAIR) 10 MG tablet TAKE 1 TABLET BY MOUTH AT BEDTIME 30 tablet 3   potassium chloride (KLOR-CON M) 10 MEQ tablet TAKE 1 TABLET BY MOUTH DAILY 30 tablet 5   pravastatin (PRAVACHOL) 10 MG tablet TAKE 1 TABLET BY MOUTH AT BEDTIME 30 tablet 1   Semaglutide, 2 MG/DOSE, (OZEMPIC, 2 MG/DOSE,) 8 MG/3ML SOPN Inject 2 mg into the skin once  a week. 9 mL 3   No current facility-administered medications for this visit.   Allergies:  Ace inhibitors and Amlodipine besylate   Social History: The patient  reports that she has never smoked. She has never used smokeless tobacco. She reports that she does not drink alcohol and does not use drugs.   Family History: The patient's family history includes Diabetes in her father and mother; Hypertension in her father and mother; Kidney disease in her father; Kidney failure in her father.   ROS:  Please see the history of present illness. Otherwise, complete review of systems  is positive for none  All other systems are reviewed and negative.   Physical Exam: VS:  BP 116/68   Pulse 89   Ht 5\' 2"  (1.575 m)   Wt 196 lb 1.6 oz (89 kg)   SpO2 95%   BMI 35.87 kg/m , BMI Body mass index is 35.87 kg/m.  Wt Readings from Last 3 Encounters:  01/30/23 196 lb 1.6 oz (89 kg)  01/01/23 191 lb 1.3 oz (86.7 kg)  09/06/22 180 lb (81.6 kg)    General: Patient appears comfortable at rest. HEENT: Conjunctiva and lids normal, oropharynx clear with moist mucosa. Neck: Supple, no elevated JVP or carotid bruits, no thyromegaly. Lungs: Clear to auscultation, nonlabored breathing at rest. Cardiac: Regular rate and rhythm, no S3 or significant systolic murmur, no pericardial rub. Abdomen: Soft, nontender, no hepatomegaly, bowel sounds present, no guarding or rebound. Extremities: No pitting edema, distal pulses 2+. Skin: Warm and dry. Musculoskeletal: No kyphosis. Neuropsychiatric: Alert and oriented x3, affect grossly appropriate.  Recent Labwork: 12/27/2022: ALT 26; AST 21; BUN 21; Creatinine, Ser 1.01; Hemoglobin 12.5; Platelets 207; Potassium 3.9; Sodium 140; TSH 0.761     Component Value Date/Time   CHOL 139 12/27/2022 0909   TRIG 64 12/27/2022 0909   HDL 41 12/27/2022 0909   CHOLHDL 3.4 12/27/2022 0909   CHOLHDL 3.3 01/13/2020 0738   VLDL 13 10/11/2016 0758   LDLCALC 85 12/27/2022 0909   LDLCALC 81 01/13/2020 0738    Assessment and Plan:  # DOE x 3 months: Obtain Lexiscan.  Exercise tolerance test from 2019 was normal.  Echocardiogram from 2019 showed normal LVEF, normal diastology and no evidence of valvular heart disease.  # HTN, controlled: Continue current antihypertensives, HCTZ 25 mg once daily, HTN management per PCP.  # HLD: Continue pravastatin 10 mg nightly, HLD management per PCP.   I have spent a total of 45 minutes with patient reviewing chart, EKGs, labs and examining patient as well as establishing an assessment and plan that was discussed with  the patient.  > 50% of time was spent in direct patient care.    Medication Adjustments/Labs and Tests Ordered: Current medicines are reviewed at length with the patient today.  Concerns regarding medicines are outlined above.   Tests Ordered: Orders Placed This Encounter  Procedures   NM Myocar Multi W/Spect W/Wall Motion / EF    Medication Changes: No orders of the defined types were placed in this encounter.   Disposition:  Follow up  pending results  Signed Nasiyah Laverdiere Verne Spurr, MD, 01/30/2023 4:45 PM    [facility] at [facility] [address] [place], [place], [place] [address]

## 2023-01-30 NOTE — Patient Instructions (Signed)
Medication Instructions:  Your physician recommends that you continue on your current medications as directed. Please refer to the Current Medication list given to you today.   Labwork: None  Testing/Procedures: Your physician has requested that you have a lexiscan myoview. For further information please visit [email or web address]. Please follow instruction sheet, as given.   Follow-Up: Your physician recommends that you schedule a follow-up appointment in: Pending Results  Any Other Special Instructions Will Be Listed Below (If Applicable).  If you need a refill on your cardiac medications before your next appointment, please call your pharmacy.  

## 2023-02-07 DIAGNOSIS — Z789 Other specified health status: Secondary | ICD-10-CM | POA: Diagnosis not present

## 2023-02-07 DIAGNOSIS — R202 Paresthesia of skin: Secondary | ICD-10-CM | POA: Diagnosis not present

## 2023-02-07 DIAGNOSIS — M25611 Stiffness of right shoulder, not elsewhere classified: Secondary | ICD-10-CM | POA: Diagnosis not present

## 2023-02-07 DIAGNOSIS — M5382 Other specified dorsopathies, cervical region: Secondary | ICD-10-CM | POA: Diagnosis not present

## 2023-02-07 DIAGNOSIS — M25511 Pain in right shoulder: Secondary | ICD-10-CM | POA: Diagnosis not present

## 2023-02-07 DIAGNOSIS — R531 Weakness: Secondary | ICD-10-CM | POA: Diagnosis not present

## 2023-02-10 DIAGNOSIS — R202 Paresthesia of skin: Secondary | ICD-10-CM | POA: Diagnosis not present

## 2023-02-10 DIAGNOSIS — M5382 Other specified dorsopathies, cervical region: Secondary | ICD-10-CM | POA: Diagnosis not present

## 2023-02-10 DIAGNOSIS — R531 Weakness: Secondary | ICD-10-CM | POA: Diagnosis not present

## 2023-02-10 DIAGNOSIS — Z789 Other specified health status: Secondary | ICD-10-CM | POA: Diagnosis not present

## 2023-02-10 DIAGNOSIS — M25611 Stiffness of right shoulder, not elsewhere classified: Secondary | ICD-10-CM | POA: Diagnosis not present

## 2023-02-10 DIAGNOSIS — M25511 Pain in right shoulder: Secondary | ICD-10-CM | POA: Diagnosis not present

## 2023-02-11 ENCOUNTER — Ambulatory Visit (HOSPITAL_COMMUNITY): Admission: RE | Admit: 2023-02-11 | Payer: Medicaid Other | Source: Ambulatory Visit

## 2023-02-11 ENCOUNTER — Encounter (HOSPITAL_COMMUNITY): Payer: Medicaid Other

## 2023-02-12 DIAGNOSIS — M25611 Stiffness of right shoulder, not elsewhere classified: Secondary | ICD-10-CM | POA: Diagnosis not present

## 2023-02-12 DIAGNOSIS — M25511 Pain in right shoulder: Secondary | ICD-10-CM | POA: Diagnosis not present

## 2023-02-12 DIAGNOSIS — R202 Paresthesia of skin: Secondary | ICD-10-CM | POA: Diagnosis not present

## 2023-02-12 DIAGNOSIS — M5382 Other specified dorsopathies, cervical region: Secondary | ICD-10-CM | POA: Diagnosis not present

## 2023-02-12 DIAGNOSIS — R531 Weakness: Secondary | ICD-10-CM | POA: Diagnosis not present

## 2023-02-12 DIAGNOSIS — Z789 Other specified health status: Secondary | ICD-10-CM | POA: Diagnosis not present

## 2023-02-13 ENCOUNTER — Other Ambulatory Visit: Payer: Self-pay | Admitting: Family Medicine

## 2023-02-13 ENCOUNTER — Other Ambulatory Visit: Payer: Self-pay | Admitting: Internal Medicine

## 2023-02-13 DIAGNOSIS — E1159 Type 2 diabetes mellitus with other circulatory complications: Secondary | ICD-10-CM

## 2023-02-18 DIAGNOSIS — M5382 Other specified dorsopathies, cervical region: Secondary | ICD-10-CM | POA: Diagnosis not present

## 2023-02-18 DIAGNOSIS — M25611 Stiffness of right shoulder, not elsewhere classified: Secondary | ICD-10-CM | POA: Diagnosis not present

## 2023-02-18 DIAGNOSIS — M25511 Pain in right shoulder: Secondary | ICD-10-CM | POA: Diagnosis not present

## 2023-02-18 DIAGNOSIS — R202 Paresthesia of skin: Secondary | ICD-10-CM | POA: Diagnosis not present

## 2023-02-18 DIAGNOSIS — Z789 Other specified health status: Secondary | ICD-10-CM | POA: Diagnosis not present

## 2023-02-18 DIAGNOSIS — R531 Weakness: Secondary | ICD-10-CM | POA: Diagnosis not present

## 2023-02-20 ENCOUNTER — Encounter: Payer: Self-pay | Admitting: Internal Medicine

## 2023-02-20 ENCOUNTER — Ambulatory Visit: Payer: Medicaid Other | Admitting: Internal Medicine

## 2023-02-20 VITALS — BP 120/70 | HR 107 | Ht 62.0 in | Wt 190.0 lb

## 2023-02-20 DIAGNOSIS — Z7985 Long-term (current) use of injectable non-insulin antidiabetic drugs: Secondary | ICD-10-CM

## 2023-02-20 DIAGNOSIS — R531 Weakness: Secondary | ICD-10-CM | POA: Diagnosis not present

## 2023-02-20 DIAGNOSIS — E782 Mixed hyperlipidemia: Secondary | ICD-10-CM

## 2023-02-20 DIAGNOSIS — E1159 Type 2 diabetes mellitus with other circulatory complications: Secondary | ICD-10-CM

## 2023-02-20 DIAGNOSIS — Z7984 Long term (current) use of oral hypoglycemic drugs: Secondary | ICD-10-CM

## 2023-02-20 DIAGNOSIS — E119 Type 2 diabetes mellitus without complications: Secondary | ICD-10-CM

## 2023-02-20 DIAGNOSIS — Z794 Long term (current) use of insulin: Secondary | ICD-10-CM

## 2023-02-20 DIAGNOSIS — M25611 Stiffness of right shoulder, not elsewhere classified: Secondary | ICD-10-CM | POA: Diagnosis not present

## 2023-02-20 DIAGNOSIS — E669 Obesity, unspecified: Secondary | ICD-10-CM

## 2023-02-20 DIAGNOSIS — R202 Paresthesia of skin: Secondary | ICD-10-CM | POA: Diagnosis not present

## 2023-02-20 DIAGNOSIS — M25511 Pain in right shoulder: Secondary | ICD-10-CM | POA: Diagnosis not present

## 2023-02-20 DIAGNOSIS — M5382 Other specified dorsopathies, cervical region: Secondary | ICD-10-CM | POA: Diagnosis not present

## 2023-02-20 DIAGNOSIS — Z789 Other specified health status: Secondary | ICD-10-CM | POA: Diagnosis not present

## 2023-02-20 LAB — HEMOGLOBIN A1C: Hemoglobin A1C: 7.1

## 2023-02-20 MED ORDER — TIRZEPATIDE 7.5 MG/0.5ML ~~LOC~~ SOAJ
7.5000 mg | SUBCUTANEOUS | 3 refills | Status: DC
Start: 2023-02-20 — End: 2023-02-28

## 2023-02-20 NOTE — Progress Notes (Signed)
Patient ID: LONITA DEBES, female   DOB: 10/19/1975, 47 y.o.   MRN: [id number]   HPI: CHARNICE ZWILLING is a 47 y.o.-year-old female, returning for f/u for DM2, dx in 2005, insulin-dependent since 2018, uncontrolled, without long-term complications.  She previously saw Dr. Fransico Him. First visit with me 02/2018.  Last visit 4 months ago.  Interim history: No increased urination, blurry vision, nausea, chest pain. She was not able to find [place] in-store sick since last visit.  She was off for 2 weeks and then started Ozempic approximately 4 weeks ago.  Sugars have been higher.  Reviewed HbA1c levels: Lab Results  Component Value Date   HGBA1C 7.3 (H) 12/27/2022   HGBA1C 6.5 (A) 07/26/2022   HGBA1C 6.6 (A) 03/15/2022   She was previously on:  - Farxiga 5 mg daily in am - started 12/2018 >> 10 mg daily in a.m. - Ozempic - started 02/2018 >> 1 mg >> 2 mg weekly - Lantus 60 >> 66 units at bedtime >> 30 units in a.m. and 40 units at bedtime - Novolog 12 units 3x a day before meals -started 11/2020 >> 12 to 15 units before each meal Also, she was on Metformin ER 1000 mg twice a day with meals - started 05/2018 >> GI intolerance She stopped Invokana b/c yeast inf's. She stopped Janumet due to abdominal pain and diarrhea. Stopped Glipizide when we started Novolog.  Insulin pump: OmniPod DASH - since  CGM: Dexcom G6 - [place] approved 08/08/2021  Insulin: NovoLog >> Aspart  Supplies: Pharmacy  Pump settings: - basal rates: 12 am: 2 >> 2.2 units/h - target: 120-120 - ICR 1:6 - ISF: 25 - Insulin on Board: 4h TDD from basal insulin: 68-80% >> 52.8 units (69-91%) >> 49-65% TDD from bolus insulin: 20-32% >> 9-31% >> 35-51% Total daily dose 70-100 units daily - extended bolusing: not using - changes infusion site: q3 days  Also: - Farxiga 10 mg daily in a.m. - Ozempic 2 mg weekly >> Mounjaro 7.5 mg weekly >> Ozempic 2 mg weekly - restarted 4 weeks ago  Pt checks her sugars 4x  a day with her Dexcom:  Previously:  Previously:   Lowest sugar was 80 >> 70>> 70; she has hypoglycemia awareness in the 70s. Highest sugar was >350 >> ... 260 >> 300s.  Continues to walk for exercise.  Glucometer: Truemetrix   Pt's meals are: - Breakfast: cereal + milk, muffin + fruit - Lunch: salad, sandwich, hamburger - Dinner: meat + veggies  - Snacks: fruit, crackers  -no CKD, last BUN/creatinine:  Lab Results  Component Value Date   BUN 21 12/27/2022   BUN 15 09/05/2022   CREATININE 1.01 (H) 12/27/2022   CREATININE 1.05 (H) 09/05/2022  Allergic to ACEI.  + Hyperlipidemia; last set of lipids: Lab Results  Component Value Date   CHOL 139 12/27/2022   HDL 41 12/27/2022   LDLCALC 85 12/27/2022   TRIG 64 12/27/2022   CHOLHDL 3.4 12/27/2022  On pravastatin 10.  - last eye exam was 11/08/2021: No DR reportedly. [facility].  Marland Kitchen no numbness and tingling in her feet.  Latest foot exam: 01/01/2023.  Pt has FH of DM in M.  ROS: + see HPI  I reviewed pt's medications, allergies, PMH, social hx, family hx, and changes were documented in the history of present illness. Otherwise, unchanged from my initial visit note.  Past Medical History:  Diagnosis Date   Constipation    GERD (gastroesophageal reflux disease)  Hypertension    Morbid obesity (HCC)    NIDDM (non-insulin dependent diabetes mellitus) 2005   Type II   OSA (obstructive sleep apnea) 06/24/2018   Past Surgical History:  Procedure Laterality Date   BACK SURGERY  05/25/2010   Dr,Kabell    CESAREAN SECTION  6/09, 2015   COLONOSCOPY WITH PROPOFOL N/A 09/06/2022   Procedure: COLONOSCOPY WITH PROPOFOL;  Surgeon: Dolores Frame, MD;  Location: [facility];  Service: Gastroenterology;  Laterality: N/A;  7:30am, asa 1-2   LUMBAR LAMINECTOMY/DECOMPRESSION MICRODISCECTOMY Left 09/13/2015   Procedure: Left Lumbar five-Sacral one microdiskectomy;  Surgeon: Coletta Memos, MD;  Location: [facility];   Service: Neurosurgery;  Laterality: Left;  Left Lumbar five-Sacral one microdiskectomy   POLYPECTOMY  09/06/2022   Procedure: POLYPECTOMY;  Surgeon: Dolores Frame, MD;  Location: [facility];  Service: Gastroenterology;;   Social History   Socioeconomic History   Marital status: Married    Spouse name: Not on file   Number of children: 2   Years of education: Not on file   Highest education level: Not on file  Occupational History   Occupation: unemployed   Tobacco Use   Smoking status: Never Smoker   Smokeless tobacco: Never Used  Substance and Sexual Activity   Alcohol use: No   Drug use: No   Current Outpatient Medications on File Prior to Visit  Medication Sig Dispense Refill   Accu-Chek Softclix Lancets lancets USE AS DIRECTED DAILY 100 each 0   blood glucose meter kit and supplies Dispense based on patient and insurance preference. Test blood glucose as directed by physician. (FOR ICD-10 E10.9, E11.9). 1 each 0   Continuous Blood Gluc Sensor (DEXCOM G7 SENSOR) MISC 3 each by Does not apply route every 30 (thirty) days. Apply 1 sensor every 10 days 9 each 4   dapagliflozin propanediol (FARXIGA) 10 MG TABS tablet Take 1 tablet (10 mg total) by mouth daily before breakfast. 90 tablet 3   Glucagon (BAQSIMI ONE PACK) 3 MG/DOSE POWD Use as needed for hypoglycemia 1 each 11   glucose blood (ACCU-CHEK GUIDE) test strip USE AS DIRECTED UP TO THREE TIMES DAILY 100 strip 5   hydrochlorothiazide (HYDRODIURIL) 25 MG tablet TAKE 1 TABLET BY MOUTH EVERY DAY 30 tablet 5   ibuprofen (ADVIL) 800 MG tablet Take 800 mg by mouth every 8 (eight) hours as needed for cramping.     insulin aspart (NOVOLOG) 100 UNIT/ML injection USE UP TO 110 units a DAY in THE insulin pump 100 mL 1   Insulin Disposable Pump (OMNIPOD DASH PODS, GEN 4,) MISC USE 1 POD EVERY TWO DAYS 45 each 1   Misc. Devices KIT 1 each by Does not apply route daily. 1 kit 0   montelukast (SINGULAIR) 10 MG tablet TAKE 1  TABLET BY MOUTH AT BEDTIME 30 tablet 3   potassium chloride (KLOR-CON M) 10 MEQ tablet TAKE 1 TABLET BY MOUTH DAILY 30 tablet 5   pravastatin (PRAVACHOL) 10 MG tablet TAKE 1 TABLET BY MOUTH AT BEDTIME 30 tablet 1   Semaglutide, 2 MG/DOSE, (OZEMPIC, 2 MG/DOSE,) 8 MG/3ML SOPN Inject 2 mg into the skin once a week. 9 mL 3   No current facility-administered medications on file prior to visit.   Allergies  Allergen Reactions   Ace Inhibitors Cough   Amlodipine Besylate     REACTION: severe edema of legs   Family History  Problem Relation Age of Onset   Diabetes Father  Hypertension Father    Kidney disease Father    Kidney failure Father    Diabetes Mother    Hypertension Mother    PE: BP 120/70   Pulse (!) 107   Ht 5\' 2"  (1.575 m)   Wt 190 lb (86.2 kg)   SpO2 99%   BMI 34.75 kg/m  Wt Readings from Last 3 Encounters:  02/20/23 190 lb (86.2 kg)  01/30/23 196 lb 1.6 oz (89 kg)  01/01/23 191 lb 1.3 oz (86.7 kg)   Constitutional: overweight, in NAD Eyes: no exophthalmos ENT: no masses palpated in neck, no cervical lymphadenopathy Cardiovascular: RRR, No MRG, + B LE edema Respiratory: CTA B Musculoskeletal: no deformities Skin: no rashes Neurological: no tremor with outstretched hands  ASSESSMENT: 1. DM2, insulin-dependent, uncontrolled, without long-term complications, but with hyperglycemia  2. HL  3. Obesity class I  PLAN:  1. Patient with longstanding, previously uncontrolled, type 2 diabetes, on a complex medication regimen including SGLT2 inhibitor, GLP-1/GIP receptor agonist and also OmniPod insulin pump + Dexcom CGM.  She is not in the closed-loop mode, as she uses the OmniPod Dash pump. -At last visit, sugars were fluctuating in the upper half of the target range, with occasional mildly hyperglycemic spikes especially after dinner.  Reviewing her pump downloads, however, she was not entering carbs consistently before meals and when she was, she was only entering  between 20-30 g of carbs.  As she was already on [place], this worked fairly well for her for breakfast and lunch, but not for dinner.  For this meal, she was mostly not entering any carbs.  I strongly advised her to start entering carbs for dinner but we did not change her regimen at that time.  HbA1c was better, at 6.5%, but she had another HbA1c obtained 2 months ago and this was 7.3%, higher. CGM interpretation: -At today's visit, we reviewed her CGM downloads: It appears that 35% of values are in target range (goal >70%), while 65% are higher than 180 (goal <25%), and 0% are lower than 70 (goal <4%).  The calculated average blood sugar is 197.  The projected HbA1c for the next 3 months (GMI) is 8.0%. -Reviewing the CGM trends, sugars appear to be higher than before, now fluctuating higher than the upper limit of the target range.  She has a hyperglycemic spike after breakfast, and sugars are uniformly high again after lunch until next morning.  Upon questioning, she has not been able to use the [facility] as she could not find it at her pharmacy and is now on Ozempic.  However, this is not working as well is [employer] retail for her so we discussed about trying to obtain the [facility] from another pharmacy or from a mail order pharmacy.  She mentions that she was advised to get it from mail order pharmacy but does not know which pharmacy is working with her insurance.  Per my records and upon searching online, this appears to be [facility] in [place], [place].  I sent a prescription for the 7.5 mg of Mounjaro there.  I advised her that if she cannot obtain this or if the sugars are still high afterwards, to increase her basal rates. -I also encouraged her to try to switch to the OmniPod 5 insulin pump.  She is not sure when she started the [facility] and I advised her to find out and let me know.  The Medicaid pump warranty is 5 years.  I believe that she would  do very well on the OmniPod 5 integrated with  the Dexcom CGM. - I suggested to:  Patient Instructions  Please continue: - Farxiga 10 mg before b'fast  Please resume: - Mounjaro 7.5 mg weekly  Also: - basal rates: 12 am: 2.2 units/h - target: 120-120 - ICR 1:6 - ISF: 25 - Insulin on Board: 4h  Try to find out when we can change to the Omnipod 5.  If you cannot start Mounjaro, increase the basal rates: 2.4 units/h  Please return in 4 months.  - we checked her HbA1c: 7.1% (actually lower than before) - advised to check sugars at different times of the day - 4x a day, rotating check times - advised for yearly eye exams >> she is due appointment scheduled for next month -  - return to clinic in 4 months  2. HL -Reviewed latest lipid panel from 06/2022: Fractions at goal with the exception of a slightly low HDL: Lab Results  Component Value Date   CHOL 139 12/27/2022   HDL 41 12/27/2022   LDLCALC 85 12/27/2022   TRIG 64 12/27/2022   CHOLHDL 3.4 12/27/2022  -She continues on pravastatin 10 mg daily without side effects  3. Obesity class I -continue SGLT 2 inhibitor and GLP-1/GIP receptor agonist which should also help with weight loss -She lost 17 pounds immediately after starting Ozempic and maintained the weight loss after switching to [facility] -she gained 2 lbs before last OV, but gained 8 pounds since then  Carlus Pavlov, MD PhD [facility] Endocrinology

## 2023-02-20 NOTE — Patient Instructions (Addendum)
Please continue: - Farxiga 10 mg before b'fast  Please resume: - Mounjaro 7.5 mg weekly  Also: - basal rates: 12 am: 2.2 units/h - target: 120-120 - ICR 1:6 - ISF: 25 - Insulin on Board: 4h  Try to find out when we can change to the Omnipod 5.  If you cannot start Mounjaro, increase the basal rates: 2.4 units/h  Please return in 4 months.

## 2023-02-25 ENCOUNTER — Encounter: Payer: Self-pay | Admitting: Internal Medicine

## 2023-02-28 ENCOUNTER — Encounter: Payer: Self-pay | Admitting: Family Medicine

## 2023-02-28 ENCOUNTER — Ambulatory Visit: Payer: Medicaid Other | Admitting: Family Medicine

## 2023-02-28 VITALS — BP 118/79 | HR 89 | Ht 62.0 in | Wt 188.0 lb

## 2023-02-28 DIAGNOSIS — I152 Hypertension secondary to endocrine disorders: Secondary | ICD-10-CM

## 2023-02-28 DIAGNOSIS — Z01419 Encounter for gynecological examination (general) (routine) without abnormal findings: Secondary | ICD-10-CM | POA: Diagnosis not present

## 2023-02-28 DIAGNOSIS — M542 Cervicalgia: Secondary | ICD-10-CM

## 2023-02-28 DIAGNOSIS — I10 Essential (primary) hypertension: Secondary | ICD-10-CM | POA: Diagnosis not present

## 2023-02-28 DIAGNOSIS — E669 Obesity, unspecified: Secondary | ICD-10-CM | POA: Diagnosis not present

## 2023-02-28 DIAGNOSIS — E1159 Type 2 diabetes mellitus with other circulatory complications: Secondary | ICD-10-CM | POA: Diagnosis not present

## 2023-02-28 DIAGNOSIS — E782 Mixed hyperlipidemia: Secondary | ICD-10-CM

## 2023-02-28 NOTE — Patient Instructions (Addendum)
F/U mid November, call if you need me sooner  Thankful much better  Please schedule  mammogram due 10/28 or after at [facility] at checkout  Fasting lipid, cmp and EGFr and microalb Nov 2 or after  It is important that you exercise regularly at least 30 minutes 5 times a week. If you develop chest pain, have severe difficulty breathing, or feel very tired, stop exercising immediately and seek medical attention  Think about what you will eat, plan ahead. Choose " clean, green, fresh or frozen" over canned, processed or packaged foods which are more sugary, salty and fatty. 70 to 75% of food eaten should be vegetables and fruit. Three meals at set times with snacks allowed between meals, but they must be fruit or vegetables. Aim to eat over a 12 hour period , example 7 am to 7 pm, and STOP after  your last meal of the day. Drink water,generally about 64 ounces per day, no other drink is as healthy. Fruit juice is best enjoyed in a healthy way, by EATING the fruit.   Thanks for choosing [facility], we consider it a privelige to serve you. .  Pap due 8/11/or after , you have been referred , you should call and schedule your appointment

## 2023-03-01 NOTE — Assessment & Plan Note (Signed)
Marked improvement with physical therpay

## 2023-03-01 NOTE — Assessment & Plan Note (Signed)
  Patient re-educated about  the importance of commitment to a  minimum of 150 minutes of exercise per week as able.  The importance of healthy food choices with portion control discussed, as well as eating regularly and within a 12 hour window most days. The need to choose "clean , green" food 50 to 75% of the time is discussed, as well as to make water the primary drink and set a goal of 64 ounces water daily.       02/28/2023    8:39 AM 02/20/2023   10:39 AM 01/30/2023    3:50 PM  Weight /BMI  Weight 188 lb 0.6 oz 190 lb 196 lb 1.6 oz  Height 5\' 2"  (1.575 m) 5\' 2"  (1.575 m) 5\' 2"  (1.575 m)  BMI 34.39 kg/m2 34.75 kg/m2 35.87 kg/m2    Unchanged

## 2023-03-01 NOTE — Assessment & Plan Note (Signed)
Controlled, no change in medication DASH diet and commitment to daily physical activity for a minimum of 30 minutes discussed and encouraged, as a part of hypertension management. The importance of attaining a healthy weight is also discussed.     02/28/2023    8:39 AM 02/20/2023   10:39 AM 01/30/2023    3:50 PM 01/01/2023    8:03 AM 09/06/2022    8:03 AM 09/06/2022    7:00 AM 07/26/2022    9:19 AM  BP/Weight  Systolic BP 118 120 116 129 101 121   Diastolic BP 79 70 68 85 56 73   Wt. (Lbs) 188.04 190 196.1 191.08  180 182.4  BMI 34.39 kg/m2 34.75 kg/m2 35.87 kg/m2 34.95 kg/m2  32.92 kg/m2 33.36 kg/m2

## 2023-03-01 NOTE — Assessment & Plan Note (Signed)
Hyperlipidemia:Low fat diet discussed and encouraged.   Lipid Panel  Lab Results  Component Value Date   CHOL 139 12/27/2022   HDL 41 12/27/2022   LDLCALC 85 12/27/2022   TRIG 64 12/27/2022   CHOLHDL 3.4 12/27/2022     At goal Updated lab needed at/ before next visit.

## 2023-03-01 NOTE — Assessment & Plan Note (Signed)
[name] is reminded of the importance of commitment to daily physical activity for 30 minutes or more, as able and the need to limit carbohydrate intake to 30 to 60 grams per meal to help with blood sugar control.   The need to take medication as prescribed, test blood sugar as directed, and to call between visits if there is a concern that blood sugar is uncontrolled is also discussed.   [name] is reminded of the importance of daily foot exam, annual eye examination, and good blood sugar, blood pressure and cholesterol control.     Latest Ref Rng & Units 12/27/2022    9:09 AM 09/05/2022    8:20 AM 07/26/2022    9:38 AM 06/12/2022    8:07 AM 03/15/2022    9:14 AM  Diabetic Labs  HbA1c 4.8 - 5.6 % 7.3   6.5   6.6   Micro/Creat Ratio 0 - 29 mg/g creat    20    Chol 100 - 199 mg/dL 782    956    HDL >21 mg/dL 41    39    Calc LDL 0 - 99 mg/dL 85    90    Triglycerides 0 - 149 mg/dL 64    67    Creatinine 0.57 - 1.00 mg/dL 3.08  6.57   8.46        02/28/2023    8:39 AM 02/20/2023   10:39 AM 01/30/2023    3:50 PM 01/01/2023    8:03 AM 09/06/2022    8:03 AM 09/06/2022    7:00 AM 07/26/2022    9:19 AM  BP/Weight  Systolic BP 118 120 116 129 101 121   Diastolic BP 79 70 68 85 56 73   Wt. (Lbs) 188.04 190 196.1 191.08  180 182.4  BMI 34.39 kg/m2 34.75 kg/m2 35.87 kg/m2 34.95 kg/m2  32.92 kg/m2 33.36 kg/m2      01/01/2023    8:00 AM 12/28/2021    8:00 AM  Foot/eye exam completion dates  Foot Form Completion Done Done      Managed by Endo

## 2023-03-01 NOTE — Progress Notes (Signed)
[name]     MRN: [id number]      DOB: November 30, 1975  Chief Complaint  Patient presents with   Follow-up    HPI [name] is here for follow up and re-evaluation of chronic medical conditions, medication management and review of any available recent lab and radiology data.  Preventive health is updated, specifically  Cancer screening and Immunization.   Questions or concerns regarding consultations or procedures which the PT has had in the interim are  addressed. The PT denies any adverse reactions to current medications since the last visit.  There are no new concerns.  There are no specific complaints Denies polyuria, polydipsia, blurred vision , or hypoglycemic episodes.    ROS Denies recent fever or chills. Denies sinus pressure, nasal congestion, ear pain or sore throat. Denies chest congestion, productive cough or wheezing. Denies chest pains, palpitations and leg swelling Denies abdominal pain, nausea, vomiting,diarrhea or constipation.   Denies dysuria, frequency, hesitancy or incontinence. Denies joint pain, swelling and limitation in mobility. Denies headaches, seizures, numbness, or tingling. Denies depression, anxiety or insomnia. Denies skin break down or rash.   PE  BP 118/79 (BP Location: Left Arm, Patient Position: Sitting, Cuff Size: Large)   Pulse 89   Ht 5\' 2"  (1.575 m)   Wt 188 lb 0.6 oz (85.3 kg)   SpO2 97%   BMI 34.39 kg/m   Patient alert and oriented and in no cardiopulmonary distress.  HEENT: No facial asymmetry, EOMI,     Neck supple .  Chest: Clear to auscultation bilaterally.  CVS: S1, S2 no murmurs, no S3.Regular rate.  ABD: Soft non tender.   Ext: No edema  MS: Adequate ROM spine, shoulders, hips and knees.  Skin: Intact, no ulcerations or rash noted.  Psych: Good eye contact, normal affect. Memory intact not anxious or depressed appearing.  CNS: CN 2-12 intact, power,  normal throughout.no focal deficits  noted.   Assessment & Plan  Essential hypertension Controlled, no change in medication DASH diet and commitment to daily physical activity for a minimum of 30 minutes discussed and encouraged, as a part of hypertension management. The importance of attaining a healthy weight is also discussed.     02/28/2023    8:39 AM 02/20/2023   10:39 AM 01/30/2023    3:50 PM 01/01/2023    8:03 AM 09/06/2022    8:03 AM 09/06/2022    7:00 AM 07/26/2022    9:19 AM  BP/Weight  Systolic BP 118 120 116 129 101 121   Diastolic BP 79 70 68 85 56 73   Wt. (Lbs) 188.04 190 196.1 191.08  180 182.4  BMI 34.39 kg/m2 34.75 kg/m2 35.87 kg/m2 34.95 kg/m2  32.92 kg/m2 33.36 kg/m2       Obesity (BMI 30.0-34.9)  Patient re-educated about  the importance of commitment to a  minimum of 150 minutes of exercise per week as able.  The importance of healthy food choices with portion control discussed, as well as eating regularly and within a 12 hour window most days. The need to choose "clean , green" food 50 to 75% of the time is discussed, as well as to make water the primary drink and set a goal of 64 ounces water daily.       02/28/2023    8:39 AM 02/20/2023   10:39 AM 01/30/2023    3:50 PM  Weight /BMI  Weight 188 lb 0.6 oz 190 lb 196 lb 1.6 oz  Height 5'  2" (1.575 m) 5\' 2"  (1.575 m) 5\' 2"  (1.575 m)  BMI 34.39 kg/m2 34.75 kg/m2 35.87 kg/m2    Unchanged  Type 2 diabetes mellitus with vascular disease (HCC) [name] is reminded of the importance of commitment to daily physical activity for 30 minutes or more, as able and the need to limit carbohydrate intake to 30 to 60 grams per meal to help with blood sugar control.   The need to take medication as prescribed, test blood sugar as directed, and to call between visits if there is a concern that blood sugar is uncontrolled is also discussed.   [name] is reminded of the importance of daily foot exam, annual eye examination, and good blood sugar,  blood pressure and cholesterol control.     Latest Ref Rng & Units 12/27/2022    9:09 AM 09/05/2022    8:20 AM 07/26/2022    9:38 AM 06/12/2022    8:07 AM 03/15/2022    9:14 AM  Diabetic Labs  HbA1c 4.8 - 5.6 % 7.3   6.5   6.6   Micro/Creat Ratio 0 - 29 mg/g creat    20    Chol 100 - 199 mg/dL 161    096    HDL >04 mg/dL 41    39    Calc LDL 0 - 99 mg/dL 85    90    Triglycerides 0 - 149 mg/dL 64    67    Creatinine 0.57 - 1.00 mg/dL 5.40  9.81   1.91        02/28/2023    8:39 AM 02/20/2023   10:39 AM 01/30/2023    3:50 PM 01/01/2023    8:03 AM 09/06/2022    8:03 AM 09/06/2022    7:00 AM 07/26/2022    9:19 AM  BP/Weight  Systolic BP 118 120 116 129 101 121   Diastolic BP 79 70 68 85 56 73   Wt. (Lbs) 188.04 190 196.1 191.08  180 182.4  BMI 34.39 kg/m2 34.75 kg/m2 35.87 kg/m2 34.95 kg/m2  32.92 kg/m2 33.36 kg/m2      01/01/2023    8:00 AM 12/28/2021    8:00 AM  Foot/eye exam completion dates  Foot Form Completion Done Done      Managed by Endo  Mixed hyperlipidemia Hyperlipidemia:Low fat diet discussed and encouraged.   Lipid Panel  Lab Results  Component Value Date   CHOL 139 12/27/2022   HDL 41 12/27/2022   LDLCALC 85 12/27/2022   TRIG 64 12/27/2022   CHOLHDL 3.4 12/27/2022     At goal Updated lab needed at/ before next visit.   Neck pain on left side Marked improvement with physical therpay

## 2023-03-17 ENCOUNTER — Other Ambulatory Visit: Payer: Self-pay | Admitting: Family Medicine

## 2023-03-18 ENCOUNTER — Encounter: Payer: Self-pay | Admitting: Internal Medicine

## 2023-03-24 ENCOUNTER — Other Ambulatory Visit (HOSPITAL_COMMUNITY): Payer: Self-pay | Admitting: Family Medicine

## 2023-03-24 DIAGNOSIS — Z1231 Encounter for screening mammogram for malignant neoplasm of breast: Secondary | ICD-10-CM

## 2023-04-11 ENCOUNTER — Ambulatory Visit: Payer: Medicaid Other | Admitting: Internal Medicine

## 2023-04-18 ENCOUNTER — Encounter: Payer: Self-pay | Admitting: Internal Medicine

## 2023-04-18 LAB — HM DIABETES EYE EXAM

## 2023-04-23 DIAGNOSIS — H5213 Myopia, bilateral: Secondary | ICD-10-CM | POA: Diagnosis not present

## 2023-04-28 ENCOUNTER — Other Ambulatory Visit: Payer: Self-pay

## 2023-04-28 MED ORDER — OZEMPIC (2 MG/DOSE) 8 MG/3ML ~~LOC~~ SOPN: 2.0000 mg | PEN_INJECTOR | SUBCUTANEOUS | 3 refills | Status: DC

## 2023-05-14 ENCOUNTER — Other Ambulatory Visit: Payer: Self-pay | Admitting: Family Medicine

## 2023-06-02 DIAGNOSIS — H524 Presbyopia: Secondary | ICD-10-CM | POA: Diagnosis not present

## 2023-06-10 DIAGNOSIS — Z0000 Encounter for general adult medical examination without abnormal findings: Secondary | ICD-10-CM | POA: Diagnosis not present

## 2023-06-10 DIAGNOSIS — Z124 Encounter for screening for malignant neoplasm of cervix: Secondary | ICD-10-CM | POA: Diagnosis not present

## 2023-06-10 DIAGNOSIS — Z803 Family history of malignant neoplasm of breast: Secondary | ICD-10-CM | POA: Diagnosis not present

## 2023-06-13 ENCOUNTER — Ambulatory Visit (HOSPITAL_COMMUNITY)
Admission: RE | Admit: 2023-06-13 | Discharge: 2023-06-13 | Disposition: A | Discharge status: Home / Self Care | Payer: Medicaid Other | Source: Ambulatory Visit | Attending: Family Medicine | Admitting: Family Medicine

## 2023-06-13 DIAGNOSIS — Z1231 Encounter for screening mammogram for malignant neoplasm of breast: Secondary | ICD-10-CM | POA: Insufficient documentation

## 2023-06-15 ENCOUNTER — Other Ambulatory Visit: Payer: Self-pay | Admitting: Family Medicine

## 2023-06-15 ENCOUNTER — Other Ambulatory Visit: Payer: Self-pay | Admitting: Internal Medicine

## 2023-06-24 ENCOUNTER — Telehealth: Payer: Self-pay

## 2023-06-24 NOTE — Telephone Encounter (Signed)
I called and spoke with the patient and she states that she would like to upgrade her omnipod. All requested scripts have been sent to [facility]

## 2023-06-24 NOTE — Telephone Encounter (Signed)
Message from Dr. Thurston Hole,  Can you check with her if we could switch to OmniPod 5, as discussed at last visit?  We got a refill request for the [facility], but I am not sure if I should refill this or the 5.  Ty!

## 2023-06-25 MED ORDER — OMNIPOD 5 G7 PODS (GEN 5) MISC: 1.0000 | 3 refills | Status: DC

## 2023-06-25 MED ORDER — OMNIPOD 5 G7 INTRO (GEN 5) KIT: 1.0000 | PACK | Freq: Once | 0 refills | Status: AC

## 2023-06-25 MED ORDER — DEXCOM G7 SENSOR MISC: 3 refills | Status: DC

## 2023-06-25 NOTE — Telephone Encounter (Signed)
Supplies has been sent in a different encounter but pt has switched to the Omnipod 5, all supplies were sent to [facility].

## 2023-06-25 NOTE — Telephone Encounter (Signed)
Rx have been sent.   Requested Prescriptions   Signed Prescriptions Disp Refills   Insulin Disposable Pump (OMNIPOD 5 G7 PODS, GEN 5,) MISC 45 each 3    Sig: 1 each by Does not apply route every other day.    Authorizing Provider: Carlus Pavlov    Ordering User: Hyacinth Meeker, Caedyn Tassinari S   Insulin Disposable Pump (OMNIPOD 5 G7 INTRO, GEN 5,) KIT 1 kit 0    Sig: 1 each by Does not apply route once for 1 dose.    Authorizing Provider: Carlus Pavlov    Ordering User: Toni Hoffmeister S   Continuous Glucose Sensor (DEXCOM G7 SENSOR) MISC 9 each 3    Sig: Use to check glucose continuously, change sensor every 10 days    Authorizing Provider: Carlus Pavlov    Ordering User: Pollie Meyer

## 2023-06-27 ENCOUNTER — Encounter: Payer: Self-pay | Admitting: Internal Medicine

## 2023-06-27 ENCOUNTER — Ambulatory Visit: Payer: Medicaid Other | Admitting: Internal Medicine

## 2023-06-27 VITALS — BP 122/78 | HR 88 | Ht 62.0 in | Wt 190.0 lb

## 2023-06-27 DIAGNOSIS — Z7984 Long term (current) use of oral hypoglycemic drugs: Secondary | ICD-10-CM

## 2023-06-27 DIAGNOSIS — E782 Mixed hyperlipidemia: Secondary | ICD-10-CM

## 2023-06-27 DIAGNOSIS — E66811 Obesity, class 1: Secondary | ICD-10-CM | POA: Diagnosis not present

## 2023-06-27 DIAGNOSIS — E1159 Type 2 diabetes mellitus with other circulatory complications: Secondary | ICD-10-CM

## 2023-06-27 DIAGNOSIS — Z7985 Long-term (current) use of injectable non-insulin antidiabetic drugs: Secondary | ICD-10-CM | POA: Diagnosis not present

## 2023-06-27 LAB — POCT GLYCOSYLATED HEMOGLOBIN (HGB A1C): Hemoglobin A1C: 6.8 % — AB (ref 4.0–5.6)

## 2023-06-27 LAB — MICROALBUMIN / CREATININE URINE RATIO
Creatinine,U: 192.9 mg/dL
Microalb Creat Ratio: 9.1 mg/g (ref 0.0–30.0)
Microalb, Ur: 17.5 mg/dL — ABNORMAL HIGH (ref 0.0–1.9)

## 2023-06-27 MED ORDER — DAPAGLIFLOZIN PROPANEDIOL 10 MG PO TABS: 10.0000 mg | ORAL_TABLET | Freq: Every day | ORAL | 3 refills | Status: DC

## 2023-06-27 NOTE — Patient Instructions (Addendum)
Please continue: - Farxiga 10 mg before b'fast - Ozempic 2 mg weekly  Also: - basal rates: 12 am: 2.4 units/h - target: 120-120 - ICR 1:6 >> 1:5 - ISF: 25 - Insulin on Board: 4h  Stop at the lab.  Please return in 4 months.

## 2023-06-27 NOTE — Progress Notes (Signed)
Patient ID: [name], female   DOB: 1976/03/20, 47 y.o.   MRN: [id number]   HPI: [name] is a 47 y.o.-year-old female, returning for f/u for DM2, dx in 2005, insulin-dependent since 2018, uncontrolled, without long-term complications.  She previously saw Dr. Fransico Him. First visit with me 02/2018.  Last visit 46 months ago.  Interim history: No increased urination, blurry vision, nausea, chest pain.  Reviewed HbA1c levels: Lab Results  Component Value Date   HGBA1C 7.1 02/20/2023   HGBA1C 7.3 (H) 12/27/2022   HGBA1C 6.5 (A) 07/26/2022   She was previously on:  - Farxiga 5 mg daily in am - started 12/2018 >> 10 mg daily in a.m. - Ozempic - started 02/2018 >> 1 mg >> 2 mg weekly - Lantus 60 >> 66 units at bedtime >> 30 units in a.m. and 40 units at bedtime - Novolog 12 units 3x a day before meals -started 11/2020 >> 12 to 15 units before each meal Also, she was on Metformin ER 1000 mg twice a day with meals - started 05/2018 >> GI intolerance She stopped Invokana b/c yeast inf's. She stopped Janumet due to abdominal pain and diarrhea. Stopped Glipizide when we started Novolog.  Insulin pump: OmniPod DASH >> will start OmniPod 5  CGM: Dexcom G6 - [place] approved 08/08/2021 >> Dexcom G7  Insulin: NovoLog >> Aspart  Supplies: Pharmacy  Pump settings: - basal rates: 12 am: 2 >> 2.2 >> 2.4 units/h - target: 120-120 - ICR 1:6 - ISF: 25 - Insulin on Board: 4h TDD from basal insulin: 68-80% >> 52.8 units (69-91%) >> 49-65% >> ~72% TDD from bolus insulin: 20-32% >> 9-31% >> 35-51% >> ~28 68% % Total daily dose 80-100 units daily - extended bolusing: not using - changes infusion site: q3 days  Also: - Farxiga 10 mg daily in a.m. - Ozempic 2 mg weekly >> Mounjaro 7.5 mg weekly >> Ozempic 2 mg weekly (could not get back on Mounjaro)  Pt checks her sugars 4x a day with her Dexcom:  Previously:  Previously:   Lowest sugar was 80 >> 70 >> 70 >> 83; she has  hypoglycemia awareness in the 70s. Highest sugar was >350 >> ... 260 >> 300s >> 296  Continues to walk for exercise.  Glucometer: Truemetrix   Pt's meals are: - Breakfast: cereal + milk, muffin + fruit - Lunch: salad, sandwich, hamburger - Dinner: meat + veggies  - Snacks: fruit, crackers  -no CKD, last BUN/creatinine:  Lab Results  Component Value Date   BUN 21 12/27/2022   BUN 15 09/05/2022   CREATININE 1.01 (H) 12/27/2022   CREATININE 1.05 (H) 09/05/2022   Lab Results  Component Value Date   MICRALBCREAT 20 06/12/2022   MICRALBCREAT 23 01/12/2021   MICRALBCREAT 13 01/07/2019   MICRALBCREAT 13 07/25/2017   MICRALBCREAT 23.1 10/16/2016   MICRALBCREAT 9 07/20/2015   MICRALBCREAT 17.1 07/14/2014   MICRALBCREAT 5.5 10/08/2012   MICRALBCREAT 4.3 10/30/2010   MICRALBCREAT 13.9 08/14/2009  Allergic to ACEI.  + Hyperlipidemia; last set of lipids: Lab Results  Component Value Date   CHOL 139 12/27/2022   HDL 41 12/27/2022   LDLCALC 85 12/27/2022   TRIG 64 12/27/2022   CHOLHDL 3.4 12/27/2022  On pravastatin 10.  - last eye exam was 04/18/2023: No DR. [facility].  Marland Kitchen no numbness and tingling in her feet.  Latest foot exam: 01/01/2023.  Pt has FH of DM in M.  ROS: + see HPI  I reviewed pt's medications, allergies, PMH, social hx, family hx, and changes were documented in the history of present illness. Otherwise, unchanged from my initial visit note.  Past Medical History:  Diagnosis Date   Constipation    GERD (gastroesophageal reflux disease)    Hypertension    Morbid obesity (HCC)    NIDDM (non-insulin dependent diabetes mellitus) 2005   Type II   OSA (obstructive sleep apnea) 06/24/2018   Past Surgical History:  Procedure Laterality Date   BACK SURGERY  05/25/2010   Dr,Kabell    CESAREAN SECTION  6/09, 2015   COLONOSCOPY WITH PROPOFOL N/A 09/06/2022   Procedure: COLONOSCOPY WITH PROPOFOL;  Surgeon: Dolores Frame, MD;  Location: [facility];   Service: Gastroenterology;  Laterality: N/A;  7:30am, asa 1-2   LUMBAR LAMINECTOMY/DECOMPRESSION MICRODISCECTOMY Left 09/13/2015   Procedure: Left Lumbar five-Sacral one microdiskectomy;  Surgeon: Coletta Memos, MD;  Location: [facility];  Service: Neurosurgery;  Laterality: Left;  Left Lumbar five-Sacral one microdiskectomy   POLYPECTOMY  09/06/2022   Procedure: POLYPECTOMY;  Surgeon: Dolores Frame, MD;  Location: [facility];  Service: Gastroenterology;;   Social History   Socioeconomic History   Marital status: Married    Spouse name: Not on file   Number of children: 2   Years of education: Not on file   Highest education level: Not on file  Occupational History   Occupation: unemployed   Tobacco Use   Smoking status: Never Smoker   Smokeless tobacco: Never Used  Substance and Sexual Activity   Alcohol use: No   Drug use: No   Current Outpatient Medications on File Prior to Visit  Medication Sig Dispense Refill   Accu-Chek Softclix Lancets lancets USE AS DIRECTED DAILY 100 each 0   blood glucose meter kit and supplies Dispense based on patient and insurance preference. Test blood glucose as directed by physician. (FOR ICD-10 E10.9, E11.9). 1 each 0   Continuous Glucose Sensor (DEXCOM G7 SENSOR) MISC Use to check glucose continuously, change sensor every 10 days 9 each 3   dapagliflozin propanediol (FARXIGA) 10 MG TABS tablet Take 1 tablet (10 mg total) by mouth daily before breakfast. 90 tablet 3   Glucagon (BAQSIMI ONE PACK) 3 MG/DOSE POWD Use as needed for hypoglycemia 1 each 11   glucose blood (ACCU-CHEK GUIDE) test strip USE AS DIRECTED UP TO THREE TIMES DAILY 100 strip 5   hydrochlorothiazide (HYDRODIURIL) 25 MG tablet TAKE 1 TABLET BY MOUTH EVERY DAY 30 tablet 5   insulin aspart (NOVOLOG) 100 UNIT/ML injection USE UP TO 110 units a DAY in THE insulin pump 100 mL 1   Insulin Disposable Pump (OMNIPOD 5 G7 PODS, GEN 5,) MISC 1 each by Does not apply route every  other day. 45 each 3   Insulin Disposable Pump (OMNIPOD DASH PODS, GEN 4,) MISC USE 1 POD EVERY TWO DAYS 45 each 1   Misc. Devices KIT 1 each by Does not apply route daily. 1 kit 0   montelukast (SINGULAIR) 10 MG tablet TAKE 1 TABLET BY MOUTH AT BEDTIME 30 tablet 3   potassium chloride (KLOR-CON M) 10 MEQ tablet TAKE 1 TABLET BY MOUTH DAILY 30 tablet 5   pravastatin (PRAVACHOL) 10 MG tablet TAKE 1 TABLET BY MOUTH AT BEDTIME 30 tablet 1   Semaglutide, 2 MG/DOSE, (OZEMPIC, 2 MG/DOSE,) 8 MG/3ML SOPN Inject 2 mg into the skin once a week. 9 mL 3   No current facility-administered medications on file prior to  visit.   Allergies  Allergen Reactions   Ace Inhibitors Cough   Amlodipine Besylate     REACTION: severe edema of legs   Family History  Problem Relation Age of Onset   Diabetes Father    Hypertension Father    Kidney disease Father    Kidney failure Father    Diabetes Mother    Hypertension Mother    PE: BP 122/78   Pulse 88   Ht 5\' 2"  (1.575 m)   Wt 190 lb (86.2 kg)   SpO2 98%   BMI 34.75 kg/m  Wt Readings from Last 3 Encounters:  06/27/23 190 lb (86.2 kg)  02/28/23 188 lb 0.6 oz (85.3 kg)  02/20/23 190 lb (86.2 kg)   Constitutional: overweight, in NAD Eyes: no exophthalmos ENT: no masses palpated in neck, no cervical lymphadenopathy Cardiovascular: RRR, No MRG, + B LE edema Respiratory: CTA B Musculoskeletal: no deformities Skin: no rashes Neurological: no tremor with outstretched hands  ASSESSMENT: 1. DM2, insulin-dependent, uncontrolled, without long-term complications, but with hyperglycemia  2. HL  3. Obesity class I  PLAN:  1. Patient with longstanding, uncontrolled, type 2 diabetes, on a complex medication regimen including SGLT2 inhibitor, GLP-1 receptor agonist and also OmniPod insulin pump + Dexcom CGM, with improved HbA1c at last visit: 7.1%, decreased from 7.3%.  At that time sugars are higher, fluctuating higher than the upper limit of the target  range.  She had hyperglycemic spikes after breakfast and sugars were uniformly high again after lunch until the next morning.  She was on Ozempic as she could not find Mounjaro at the pharmacy.  I sent a new prescription for [facility] in [place], [place], but she was not able to receive it so she continues on Ozempic.  We did discuss that if she were not able to fill this, to increase her basal rates.  Few days ago she needed a refill for her OmniPod Dash pump and I suggested to switch to OmniPod 5. This was approved for her - waiting on shipment. CGM interpretation: -At today's visit, we reviewed her CGM downloads: It appears that 68% of values are in target range (goal >70%), while 32% are higher than 180 (goal <25%), and 0% are lower than 70 (goal <4%).  The calculated average blood sugar is 165.  The projected HbA1c for the next 3 months (GMI) is 7.3%. -Reviewing the CGM trends, sugars appear to be fluctuating mostly in the upper half of the target range but with hyperglycemic spikes usually after meals.  At today's visit we discussed that starting the OmniPod 5 integrated with the Dexcom CGM in the close system will help with her diabetes control further, but I also advised her to try to strengthen her insulin to carb ratios for better blood sugars after meals.  Will also refer her to diabetes education to have the new pump attached after she receives it. - I suggested to:  Patient Instructions  Please continue: - Farxiga 10 mg before b'fast - Ozempic 2 mg weekly  Also: - basal rates: 12 am: 2.4 units/h - target: 120-120 - ICR 1:6 >> 1:5 - ISF: 25 - Insulin on Board: 4h  Stop at the lab.  Please return in 4 months.  - we checked her HbA1c: 6.8% (lower) - advised to check sugars at different times of the day - 4x a day, rotating check times - advised for yearly eye exams >> she is UTD - return to clinic in 4 months  2. HL -Reviewed latest lipid panel from 12/2022: Fractions at  goal: Lab Results  Component Value Date   CHOL 139 12/27/2022   HDL 41 12/27/2022   LDLCALC 85 12/27/2022   TRIG 64 12/27/2022   CHOLHDL 3.4 12/27/2022  -She continues pravastatin 10 mg daily without side effects  3. Obesity class I -continue SGLT 2 inhibitor and GLP-1/GIP receptor agonist which should also help with weight loss -She lost 17 pounds immediately after starting Ozempic and maintained the weight loss after switching to [facility] -Unfortunately, she was not able to get back on Mounjaro since last visit so she continues Ozempic -Before last visit, she gained 8 pounds.  Since then, she gained 2.  Carlus Pavlov, MD PhD [facility] Endocrinology

## 2023-06-30 DIAGNOSIS — E782 Mixed hyperlipidemia: Secondary | ICD-10-CM | POA: Diagnosis not present

## 2023-06-30 DIAGNOSIS — E1159 Type 2 diabetes mellitus with other circulatory complications: Secondary | ICD-10-CM | POA: Diagnosis not present

## 2023-06-30 DIAGNOSIS — I152 Hypertension secondary to endocrine disorders: Secondary | ICD-10-CM | POA: Diagnosis not present

## 2023-07-01 ENCOUNTER — Ambulatory Visit: Payer: Medicaid Other | Admitting: Family Medicine

## 2023-07-01 LAB — CMP14+EGFR
ALT: 17 [IU]/L (ref 0–32)
AST: 18 [IU]/L (ref 0–40)
Albumin: 4 g/dL (ref 3.9–4.9)
Alkaline Phosphatase: 101 [IU]/L (ref 44–121)
BUN/Creatinine Ratio: 13 (ref 9–23)
BUN: 13 mg/dL (ref 6–24)
Bilirubin Total: 0.4 mg/dL (ref 0.0–1.2)
CO2: 26 mmol/L (ref 20–29)
Calcium: 9.4 mg/dL (ref 8.7–10.2)
Chloride: 100 mmol/L (ref 96–106)
Creatinine, Ser: 1.01 mg/dL — ABNORMAL HIGH (ref 0.57–1.00)
Globulin, Total: 2.9 g/dL (ref 1.5–4.5)
Glucose: 97 mg/dL (ref 70–99)
Potassium: 3.6 mmol/L (ref 3.5–5.2)
Sodium: 141 mmol/L (ref 134–144)
Total Protein: 6.9 g/dL (ref 6.0–8.5)
eGFR: 70 mL/min/{1.73_m2} (ref >59)

## 2023-07-01 LAB — LIPID PANEL
Chol/HDL Ratio: 3.4 {ratio} (ref 0.0–4.4)
Cholesterol, Total: 129 mg/dL (ref 100–199)
HDL: 38 mg/dL — ABNORMAL LOW (ref >39)
LDL Chol Calc (NIH): 79 mg/dL (ref 0–99)
Triglycerides: 57 mg/dL (ref 0–149)
VLDL Cholesterol Cal: 12 mg/dL (ref 5–40)

## 2023-07-02 ENCOUNTER — Encounter: Payer: Self-pay | Admitting: Internal Medicine

## 2023-07-03 MED ORDER — OMNIPOD DASH PODS (GEN 4) MISC: 1 refills | Status: DC

## 2023-07-03 NOTE — Progress Notes (Signed)
Established Patient Office Visit   Subjective  Patient ID: [name], female    DOB: 10-Oct-1975  Age: 47 y.o. MRN: [id number]  Chief Complaint  Patient presents with   Follow-up    HTN/ pain and swelling in legs/ pain,tingling, and swelling in rt. Arm and hand.     She  has a past medical history of Constipation, GERD (gastroesophageal reflux disease), Hypertension, Morbid obesity (HCC), NIDDM (non-insulin dependent diabetes mellitus) (2005), and OSA (obstructive sleep apnea) (06/24/2018).   Peripheral Neuropathy: Pain  The patient reports chronic pain in her hands, which she describes as tingling and numbness bilaterally, accompanied by an aching sensation in her left ankle. The pain, rated at an intensity of 8/10 and occurring intermittently, began three months ago without any preceding injury. It has remained constant since onset. She also notes that the pain radiates to her left foot. Symptoms tend to worsen midday and are aggravated by movement, writing, and walking. Rest provides some relief. She has attempted treatment with acetaminophen and physical therapy, both of which have provided only mild improvement.    Review of Systems  Constitutional:  Negative for chills and fever.  Eyes:  Negative for blurred vision.  Respiratory:  Negative for shortness of breath.   Cardiovascular:  Negative for chest pain.  Gastrointestinal:  Negative for abdominal pain.  Musculoskeletal:  Positive for myalgias.  Neurological:  Negative for dizziness and headaches.      Objective:     BP 125/83   Pulse 98   Ht 5\' 2"  (1.575 m)   Wt 192 lb (87.1 kg)   LMP 06/20/2023 (Exact Date)   SpO2 95%   BMI 35.12 kg/m  BP Readings from Last 3 Encounters:  07/04/23 125/83  06/27/23 122/78  02/28/23 118/79      Physical Exam Vitals reviewed.  Constitutional:      General: She is not in acute distress.    Appearance: Normal appearance. She is not ill-appearing, toxic-appearing or  diaphoretic.  HENT:     Head: Normocephalic.  Eyes:     General:        Right eye: No discharge.        Left eye: No discharge.     Conjunctiva/sclera: Conjunctivae normal.  Cardiovascular:     Rate and Rhythm: Normal rate.     Pulses: Normal pulses.     Heart sounds: Normal heart sounds.  Pulmonary:     Effort: Pulmonary effort is normal. No respiratory distress.     Breath sounds: Normal breath sounds.  Musculoskeletal:        General: Normal range of motion.     Cervical back: Normal range of motion.  Skin:    General: Skin is warm and dry.     Capillary Refill: Capillary refill takes less than 2 seconds.  Neurological:     Mental Status: She is alert.     Coordination: Coordination normal.     Gait: Gait normal.  Psychiatric:        Mood and Affect: Mood normal.        Behavior: Behavior normal.      No results found for any visits on 07/04/23.  The ASCVD Risk score (Arnett DK, et al., 2019) failed to calculate for the following reasons:   The valid total cholesterol range is 130 to 320 mg/dL    Assessment & Plan:  Type 2 diabetes mellitus with peripheral neuropathy (HCC) -     Gabapentin; Take 1 capsule (  300 mg total) by mouth 3 (three) times daily as needed.  Dispense: 90 capsule; Refill: 3  Encounter for immunization -     Flu vaccine trivalent PF, 6mos and older(Flulaval,Afluria,Fluarix,Fluzone)  Other polyneuropathy Assessment & Plan: Trial on Gabapentin 300 mg PRN Advise patient to  prioritize regular low-impact exercises like walking or swimming to improve circulation and reduce symptoms. Maintain a balanced diet rich in vitamins, particularly B12 and antioxidants, to support nerve health and prevent further damage.   Essential hypertension Assessment & Plan: Vitals:   07/04/23 0825  BP: 125/83  Blood pressure controlled in today's visit Recent BMP, CBC WNL Continue Hydrochlorothiazide 25 mg once daily Discussed with  patient to monitor their  blood pressure regularly and maintain a heart-healthy diet rich in fruits, vegetables, whole grains, and low-fat dairy, while reducing sodium intake to less than 2,300 mg per day. Regular physical activity, such as 30 minutes of moderate exercise most days of the week, will help lower blood pressure and improve overall cardiovascular health. Avoiding smoking, limiting alcohol consumption, and managing stress. Take  prescribed medication, & take it as directed and avoid skipping doses. Seek emergency care if your blood pressure is (over 180/100) or you experience chest pain, shortness of breath, or sudden vision changes.Patient verbalizes understanding regarding plan of care and all questions answered.      Return in 17 weeks (on 10/31/2023), or if symptoms worsen or fail to improve, for chronic follow-up with Dr. Lodema Hong .   Cruzita Lederer Newman Nip, FNP

## 2023-07-03 NOTE — Patient Instructions (Addendum)
Great to see you today.  I have refilled the medication(s) we provide.    Excess weight can impair the lymphatic system, which is responsible for draining excess fluid from tissues. When the lymphatic system is overwhelmed or damaged, fluid can accumulate in the legs.  Venous Insufficiency: Mechanism: Excess weight can put additional pressure on the veins in the legs, making it harder for blood to return to the heart. This pressure can cause the veins to become weak or damaged, leading to pooling of blood in the lower extremities. Symptoms: Swelling (edema), varicose veins, aching or heaviness in the legs.   If labs were collected, we will inform you of lab results once received either by echart message or telephone call.   - echart message- for normal results that have been seen by the patient already.   - telephone call: abnormal results or if patient has not viewed results in their echart.   - Please take medications as prescribed. - Follow up with your primary health provider if any health concerns arises. - If symptoms worsen please contact your primary care provider and/or visit the emergency department.

## 2023-07-04 ENCOUNTER — Encounter: Payer: Self-pay | Admitting: Internal Medicine

## 2023-07-04 ENCOUNTER — Ambulatory Visit: Payer: Medicaid Other | Admitting: Family Medicine

## 2023-07-04 ENCOUNTER — Encounter: Payer: Self-pay | Admitting: Family Medicine

## 2023-07-04 VITALS — BP 125/83 | HR 98 | Ht 62.0 in | Wt 192.0 lb

## 2023-07-04 DIAGNOSIS — I10 Essential (primary) hypertension: Secondary | ICD-10-CM

## 2023-07-04 DIAGNOSIS — G629 Polyneuropathy, unspecified: Secondary | ICD-10-CM | POA: Insufficient documentation

## 2023-07-04 DIAGNOSIS — G6289 Other specified polyneuropathies: Secondary | ICD-10-CM | POA: Diagnosis not present

## 2023-07-04 DIAGNOSIS — E1142 Type 2 diabetes mellitus with diabetic polyneuropathy: Secondary | ICD-10-CM

## 2023-07-04 DIAGNOSIS — Z23 Encounter for immunization: Secondary | ICD-10-CM | POA: Diagnosis not present

## 2023-07-04 MED ORDER — OMNIPOD 5 G7 INTRO (GEN 5) KIT: 1.0000 | PACK | Freq: Once | 0 refills | Status: AC

## 2023-07-04 MED ORDER — OMNIPOD 5 G7 PODS (GEN 5) MISC: 1.0000 | 3 refills | Status: DC

## 2023-07-04 MED ORDER — GABAPENTIN 300 MG PO CAPS: 300.0000 mg | ORAL_CAPSULE | Freq: Three times a day (TID) | ORAL | 3 refills | Status: DC | PRN

## 2023-07-04 NOTE — Assessment & Plan Note (Signed)
Vitals:   07/04/23 0825  BP: 125/83  Blood pressure controlled in today's visit Recent BMP, CBC WNL Continue Hydrochlorothiazide 25 mg once daily Discussed with  patient to monitor their blood pressure regularly and maintain a heart-healthy diet rich in fruits, vegetables, whole grains, and low-fat dairy, while reducing sodium intake to less than 2,300 mg per day. Regular physical activity, such as 30 minutes of moderate exercise most days of the week, will help lower blood pressure and improve overall cardiovascular health. Avoiding smoking, limiting alcohol consumption, and managing stress. Take  prescribed medication, & take it as directed and avoid skipping doses. Seek emergency care if your blood pressure is (over 180/100) or you experience chest pain, shortness of breath, or sudden vision changes.Patient verbalizes understanding regarding plan of care and all questions answered.

## 2023-07-04 NOTE — Assessment & Plan Note (Signed)
Trial on Gabapentin 300 mg PRN Advise patient to  prioritize regular low-impact exercises like walking or swimming to improve circulation and reduce symptoms. Maintain a balanced diet rich in vitamins, particularly B12 and antioxidants, to support nerve health and prevent further damage.

## 2023-07-08 ENCOUNTER — Ambulatory Visit: Payer: Medicaid Other | Admitting: Nutrition

## 2023-07-12 ENCOUNTER — Encounter: Payer: Self-pay | Admitting: Internal Medicine

## 2023-07-14 MED ORDER — OMNIPOD DASH PODS (GEN 4) MISC: 1 refills | Status: DC

## 2023-07-15 ENCOUNTER — Other Ambulatory Visit: Payer: Self-pay | Admitting: Family Medicine

## 2023-07-15 ENCOUNTER — Telehealth: Payer: Self-pay | Admitting: Nutrition

## 2023-07-15 NOTE — Telephone Encounter (Signed)
Patient sent a MyChart message stating she has not received a call from [facility] [place] and the number that was provided to her does not work.

## 2023-07-15 NOTE — Telephone Encounter (Signed)
Patient called to schedule pump training.  She can only due Fridays and I am not here.  Randall An called and emailed and pump start orders faxed to her with the request that the training be done on Fridays

## 2023-07-16 ENCOUNTER — Telehealth: Payer: Self-pay | Admitting: Nutrition

## 2023-07-16 MED ORDER — INSULIN PEN NEEDLE 32G X 4 MM MISC: 1 refills | Status: AC

## 2023-07-16 MED ORDER — INSULIN GLARGINE 100 UNIT/ML SOLOSTAR PEN: 38.0000 [IU] | PEN_INJECTOR | Freq: Every day | SUBCUTANEOUS | 2 refills | Status: AC

## 2023-07-16 MED ORDER — NOVOLOG FLEXPEN 100 UNIT/ML ~~LOC~~ SOPN: PEN_INJECTOR | SUBCUTANEOUS | 1 refills | Status: AC

## 2023-07-16 NOTE — Telephone Encounter (Signed)
Requested Prescriptions   Signed Prescriptions Disp Refills   insulin glargine (LANTUS) 100 UNIT/ML Solostar Pen 15 mL 2    Sig: Inject 38 Units into the skin at bedtime.    Authorizing Provider: Carlus Pavlov    Ordering User: Hyacinth Meeker, Leyla Soliz S   insulin aspart (NOVOLOG FLEXPEN) 100 UNIT/ML FlexPen 15 mL 1    Sig: Use parameters as an insulin to carb ratio of 1:6 and a sliding scale starting at 150 and adding 1 unit for every 25 above 150.    Authorizing Provider: Carlus Pavlov    Ordering User: Hyacinth Meeker, Shatera Rennert S   Insulin Pen Needle 32G X 4 MM MISC 200 each 1    Sig: Use to inject insulin when pump is not working    Presenter, broadcasting: Carlus Pavlov    Ordering User: Pollie Meyer

## 2023-07-16 NOTE — Telephone Encounter (Signed)
Patient called saying the OmniPod rep. Has not called her and she is out of pods.  I told her to call the rep, to see if she has pods and/or can start her on her pump this Friday.  I have scheduled her with me for next Tuesday late, so that she can come after work. Please order her some long acting insulin and syringes to use for the Novolog and other insulin.

## 2023-07-16 NOTE — Telephone Encounter (Signed)
Patient notified that trainer will call her.  She was given the trainer's telephone number if she does not contact her within a day or two.

## 2023-07-16 NOTE — Telephone Encounter (Signed)
Completed in other encounter.

## 2023-07-19 ENCOUNTER — Other Ambulatory Visit: Payer: Self-pay | Admitting: Family Medicine

## 2023-07-21 ENCOUNTER — Other Ambulatory Visit: Payer: Self-pay | Admitting: Internal Medicine

## 2023-07-21 DIAGNOSIS — E1159 Type 2 diabetes mellitus with other circulatory complications: Secondary | ICD-10-CM

## 2023-07-22 ENCOUNTER — Ambulatory Visit: Payer: Medicaid Other | Admitting: Nutrition

## 2023-07-30 ENCOUNTER — Other Ambulatory Visit (HOSPITAL_COMMUNITY): Payer: Self-pay

## 2023-07-30 ENCOUNTER — Other Ambulatory Visit: Payer: Self-pay

## 2023-08-25 ENCOUNTER — Other Ambulatory Visit: Payer: Self-pay | Admitting: Internal Medicine

## 2023-09-14 ENCOUNTER — Other Ambulatory Visit: Payer: Self-pay | Admitting: Family Medicine

## 2023-09-22 ENCOUNTER — Other Ambulatory Visit (HOSPITAL_COMMUNITY): Payer: Self-pay

## 2023-09-22 ENCOUNTER — Telehealth: Payer: Self-pay | Admitting: Pharmacy Technician

## 2023-09-22 NOTE — Telephone Encounter (Signed)
 Pharmacy Patient Advocate Encounter   Received notification from CoverMyMeds that prior authorization for Dexcom G7 Sensors is due for renewal.   Insurance verification completed.   The patient is insured through [facility].  Action: PA required; PA submitted to above mentioned insurance via CoverMyMeds Key/confirmation #/EOC [id number] Status is pending

## 2023-09-23 NOTE — Telephone Encounter (Signed)
Pharmacy Patient Advocate Encounter  Received notification from [facility] that Prior Authorization for Dexcom G7 sensor has been APPROVED through 09/20/2024   PA #/Case ID/Reference #: [id number]

## 2023-10-14 ENCOUNTER — Other Ambulatory Visit: Payer: Self-pay | Admitting: Family Medicine

## 2023-10-27 ENCOUNTER — Encounter: Payer: Self-pay | Admitting: Family Medicine

## 2023-10-27 DIAGNOSIS — R7989 Other specified abnormal findings of blood chemistry: Secondary | ICD-10-CM

## 2023-10-27 DIAGNOSIS — E669 Obesity, unspecified: Secondary | ICD-10-CM

## 2023-10-27 DIAGNOSIS — E785 Hyperlipidemia, unspecified: Secondary | ICD-10-CM

## 2023-10-27 DIAGNOSIS — I10 Essential (primary) hypertension: Secondary | ICD-10-CM

## 2023-10-30 DIAGNOSIS — I10 Essential (primary) hypertension: Secondary | ICD-10-CM | POA: Diagnosis not present

## 2023-10-30 DIAGNOSIS — R7989 Other specified abnormal findings of blood chemistry: Secondary | ICD-10-CM | POA: Diagnosis not present

## 2023-10-30 DIAGNOSIS — E785 Hyperlipidemia, unspecified: Secondary | ICD-10-CM | POA: Diagnosis not present

## 2023-10-30 DIAGNOSIS — E669 Obesity, unspecified: Secondary | ICD-10-CM | POA: Diagnosis not present

## 2023-10-31 ENCOUNTER — Other Ambulatory Visit (HOSPITAL_COMMUNITY): Payer: Self-pay | Admitting: Family Medicine

## 2023-10-31 ENCOUNTER — Encounter: Payer: Self-pay | Admitting: Family Medicine

## 2023-10-31 ENCOUNTER — Ambulatory Visit: Payer: Medicaid Other | Admitting: Family Medicine

## 2023-10-31 VITALS — BP 123/82 | HR 91 | Resp 16 | Ht 62.0 in | Wt 187.0 lb

## 2023-10-31 DIAGNOSIS — H9202 Otalgia, left ear: Secondary | ICD-10-CM | POA: Diagnosis not present

## 2023-10-31 DIAGNOSIS — E1159 Type 2 diabetes mellitus with other circulatory complications: Secondary | ICD-10-CM

## 2023-10-31 DIAGNOSIS — E66811 Obesity, class 1: Secondary | ICD-10-CM

## 2023-10-31 DIAGNOSIS — E782 Mixed hyperlipidemia: Secondary | ICD-10-CM | POA: Diagnosis not present

## 2023-10-31 DIAGNOSIS — G8929 Other chronic pain: Secondary | ICD-10-CM | POA: Insufficient documentation

## 2023-10-31 DIAGNOSIS — Z6834 Body mass index (BMI) 34.0-34.9, adult: Secondary | ICD-10-CM

## 2023-10-31 DIAGNOSIS — I10 Essential (primary) hypertension: Secondary | ICD-10-CM | POA: Diagnosis not present

## 2023-10-31 DIAGNOSIS — Z1231 Encounter for screening mammogram for malignant neoplasm of breast: Secondary | ICD-10-CM

## 2023-10-31 LAB — CBC WITH DIFFERENTIAL/PLATELET
Basophils Absolute: 0.1 10*3/uL (ref 0.0–0.2)
Basos: 1 %
EOS (ABSOLUTE): 0.2 10*3/uL (ref 0.0–0.4)
Eos: 2 %
Hematocrit: 41.7 % (ref 34.0–46.6)
Hemoglobin: 13.5 g/dL (ref 11.1–15.9)
Immature Grans (Abs): 0 10*3/uL (ref 0.0–0.1)
Immature Granulocytes: 0 %
Lymphocytes Absolute: 2.2 10*3/uL (ref 0.7–3.1)
Lymphs: 23 %
MCH: 26.1 pg — ABNORMAL LOW (ref 26.6–33.0)
MCHC: 32.4 g/dL (ref 31.5–35.7)
MCV: 81 fL (ref 79–97)
Monocytes Absolute: 0.7 10*3/uL (ref 0.1–0.9)
Monocytes: 7 %
Neutrophils Absolute: 6.5 10*3/uL (ref 1.4–7.0)
Neutrophils: 67 %
Platelets: 220 10*3/uL (ref 150–450)
RBC: 5.17 x10E6/uL (ref 3.77–5.28)
RDW: 12.8 % (ref 11.7–15.4)
WBC: 9.7 10*3/uL (ref 3.4–10.8)

## 2023-10-31 LAB — LIPID PANEL W/O CHOL/HDL RATIO
Cholesterol, Total: 144 mg/dL (ref 100–199)
HDL: 41 mg/dL (ref >39)
LDL Chol Calc (NIH): 87 mg/dL (ref 0–99)
Triglycerides: 85 mg/dL (ref 0–149)
VLDL Cholesterol Cal: 16 mg/dL (ref 5–40)

## 2023-10-31 LAB — CMP14+EGFR
ALT: 29 IU/L (ref 0–32)
AST: 19 IU/L (ref 0–40)
Albumin: 4.1 g/dL (ref 3.9–4.9)
Alkaline Phosphatase: 106 IU/L (ref 44–121)
BUN/Creatinine Ratio: 11 (ref 9–23)
BUN: 11 mg/dL (ref 6–24)
Bilirubin Total: 0.4 mg/dL (ref 0.0–1.2)
CO2: 26 mmol/L (ref 20–29)
Calcium: 9 mg/dL (ref 8.7–10.2)
Chloride: 99 mmol/L (ref 96–106)
Creatinine, Ser: 0.98 mg/dL (ref 0.57–1.00)
Globulin, Total: 2.8 g/dL (ref 1.5–4.5)
Glucose: 127 mg/dL — ABNORMAL HIGH (ref 70–99)
Potassium: 3.8 mmol/L (ref 3.5–5.2)
Sodium: 140 mmol/L (ref 134–144)
Total Protein: 6.9 g/dL (ref 6.0–8.5)
eGFR: 72 mL/min/{1.73_m2} (ref >59)

## 2023-10-31 LAB — TSH: TSH: 0.806 u[IU]/mL (ref 0.450–4.500)

## 2023-10-31 LAB — VITAMIN D 25 HYDROXY (VIT D DEFICIENCY, FRACTURES): Vit D, 25-Hydroxy: 36.9 ng/mL (ref 30.0–100.0)

## 2023-10-31 NOTE — Assessment & Plan Note (Signed)
 3 month h/o discomfort inleft ear , wart growing in outer ear, refer ENT

## 2023-10-31 NOTE — Assessment & Plan Note (Addendum)
 improved Patient re-educated about  the importance of commitment to a  minimum of 150 minutes of exercise per week as able.  The importance of healthy food choices with portion control discussed, as well as eating regularly and within a 12 hour window most days. The need to choose "clean , green" food 50 to 75% of the time is discussed, as well as to make water the primary drink and set a goal of 64 ounces water daily.       10/31/2023    8:35 AM 07/04/2023    8:25 AM 06/27/2023    9:37 AM  Weight /BMI  Weight 187 lb 192 lb 190 lb  Height 5\' 2"  (1.575 m) 5\' 2"  (1.575 m) 5\' 2"  (1.575 m)  BMI 34.2 kg/m2 35.12 kg/m2 34.75 kg/m2

## 2023-10-31 NOTE — Patient Instructions (Addendum)
 Annual exam in 6 months, call if you need me sooner  No medication changes, excellent lab   Fasting labs 3 to 5 days before next visit  You are referred to ENT  Please schedule mammogram at checkout  It is important that you exercise regularly at least 30 minutes 5 times a week. If you develop chest pain, have severe difficulty breathing, or feel very tired, stop exercising immediately and seek medical attention   Think about what you will eat, plan ahead. Choose " clean, green, fresh or frozen" over canned, processed or packaged foods which are more sugary, salty and fatty. 70 to 75% of food eaten should be vegetables and fruit. Three meals at set times with snacks allowed between meals, but they must be fruit or vegetables. Aim to eat over a 12 hour period , example 7 am to 7 pm, and STOP after  your last meal of the day. Drink water,generally about 64 ounces per day, no other drink is as healthy. Fruit juice is best enjoyed in a healthy way, by EATING the fruit.   Thanks for choosing [facility], we consider it a privelige to serve you.

## 2023-10-31 NOTE — Assessment & Plan Note (Addendum)
 Diabetes associated with hypertension and hyperlipidemia Controlled well and managed by Endo  [name] is reminded of the importance of commitment to daily physical activity for 30 minutes or more, as able and the need to limit carbohydrate intake to 30 to 60 grams per meal to help with blood sugar control.   The need to take medication as prescribed, test blood sugar as directed, and to call between visits if there is a concern that blood sugar is uncontrolled is also discussed.   [name] is reminded of the importance of daily foot exam, annual eye examination, and good blood sugar, blood pressure and cholesterol control.     Latest Ref Rng & Units 10/30/2023    8:22 AM 06/30/2023    8:59 AM 06/27/2023    9:53 AM 06/27/2023    9:45 AM 02/20/2023   12:00 AM  Diabetic Labs  HbA1c 4.0 - 5.6 %    6.8  7.1      Microalbumin 0.0 - 1.9 mg/dL   10.2     Micro/Creat Ratio 0.0 - 30.0 mg/g   9.1     Chol 100 - 199 mg/dL 725  366      HDL >44 mg/dL 41  38      Calc LDL 0 - 99 mg/dL 87  79      Triglycerides 0 - 149 mg/dL 85  57      Creatinine 0.57 - 1.00 mg/dL 0.34  7.42         This result is from an external source.      10/31/2023    8:35 AM 07/04/2023    8:25 AM 06/27/2023    9:37 AM 02/28/2023    8:39 AM 02/20/2023   10:39 AM 01/30/2023    3:50 PM 01/01/2023    8:03 AM  BP/Weight  Systolic BP 123 125 122 118 120 116 129  Diastolic BP 82 83 78 79 70 68 85  Wt. (Lbs) 187 192 190 188.04 190 196.1 191.08  BMI 34.2 kg/m2 35.12 kg/m2 34.75 kg/m2 34.39 kg/m2 34.75 kg/m2 35.87 kg/m2 34.95 kg/m2      Latest Ref Rng & Units 04/18/2023   12:00 AM 01/01/2023    8:00 AM  Foot/eye exam completion dates  Eye Exam No Retinopathy No Retinopathy       Foot Form Completion   Done     This result is from an external source.

## 2023-10-31 NOTE — Assessment & Plan Note (Signed)
 Controlled, no change in medication\ DASH diet and commitment to daily physical activity for a minimum of 30 minutes discussed and encouraged, as a part of hypertension management. The importance of attaining a healthy weight is also discussed.     10/31/2023    8:35 AM 07/04/2023    8:25 AM 06/27/2023    9:37 AM 02/28/2023    8:39 AM 02/20/2023   10:39 AM 01/30/2023    3:50 PM 01/01/2023    8:03 AM  BP/Weight  Systolic BP 123 125 122 118 120 116 129  Diastolic BP 82 83 78 79 70 68 85  Wt. (Lbs) 187 192 190 188.04 190 196.1 191.08  BMI 34.2 kg/m2 35.12 kg/m2 34.75 kg/m2 34.39 kg/m2 34.75 kg/m2 35.87 kg/m2 34.95 kg/m2

## 2023-10-31 NOTE — Assessment & Plan Note (Signed)
 Hyperlipidemia:Low fat diet discussed and encouraged.   Lipid Panel  Lab Results  Component Value Date   CHOL 144 10/30/2023   HDL 41 10/30/2023   LDLCALC 87 10/30/2023   TRIG 85 10/30/2023   CHOLHDL 3.4 06/30/2023     Controlled, no change in medication Updated lab needed at/ before next visit.

## 2023-10-31 NOTE — Progress Notes (Signed)
 [name]     MRN: [id number]      DOB: 09-01-1975  Chief Complaint  Patient presents with   Ear Pain    Right ear pain and itchiness, left ear has a place on the inside that is irritating her when she tries to wear ear buds     HPI [name] is here for follow up and re-evaluation of chronic medical conditions, medication management and review of any available recent lab and radiology data.  Preventive health is updated, specifically  Cancer screening and Immunization.   Questions or concerns regarding consultations or procedures which the PT has had in the interim are  addressed. The PT denies any adverse reactions to current medications since the last visit.  Itcvhy right er and growth in left ear Denies polyuria, polydipsia, blurred vision , h had a few   hypoglycemic episodes.  ROS Denies recent fever or chills. Denies sinus pressure, nasal congestion,  or sore throat. Denies chest congestion, productive cough or wheezing. Denies chest pains, palpitations and leg swelling Denies abdominal pain, nausea, vomiting,diarrhea or constipation.   Denies dysuria, frequency, hesitancy or incontinence. Denies joint pain, swelling and limitation in mobility. Denies headaches, seizures, numbness, or tingling. Denies depression, anxiety or insomnia. Denies skin break down or rash.   PE  BP 123/82   Pulse 91   Resp 16   Ht 5\' 2"  (1.575 m)   Wt 187 lb (84.8 kg)   SpO2 95%   BMI 34.20 kg/m   Patient alert and oriented and in no cardiopulmonary distress.  HEENT: No facial asymmetry, EOMI,     Neck supple .  Chest: Clear to auscultation bilaterally.  CVS: S1, S2 no murmurs, no S3.Regular rate.  ABD: Soft non tender.   Ext: No edema  MS: Adequate ROM spine, shoulders, hips and knees.  Skin: Intact, no ulcerations or rash noted.  Psych: Good eye contact, normal affect. Memory intact not anxious or depressed appearing.  CNS: CN 2-12 intact, power,  normal  throughout.no focal deficits noted.   Assessment & Plan  Chronic left ear pain 3 month h/o discomfort inleft ear , wart growing in outer ear, refer ENT  Essential hypertension Controlled, no change in medication\ DASH diet and commitment to daily physical activity for a minimum of 30 minutes discussed and encouraged, as a part of hypertension management. The importance of attaining a healthy weight is also discussed.     10/31/2023    8:35 AM 07/04/2023    8:25 AM 06/27/2023    9:37 AM 02/28/2023    8:39 AM 02/20/2023   10:39 AM 01/30/2023    3:50 PM 01/01/2023    8:03 AM  BP/Weight  Systolic BP 123 125 122 118 120 116 129  Diastolic BP 82 83 78 79 70 68 85  Wt. (Lbs) 187 192 190 188.04 190 196.1 191.08  BMI 34.2 kg/m2 35.12 kg/m2 34.75 kg/m2 34.39 kg/m2 34.75 kg/m2 35.87 kg/m2 34.95 kg/m2       Mixed hyperlipidemia Hyperlipidemia:Low fat diet discussed and encouraged.   Lipid Panel  Lab Results  Component Value Date   CHOL 144 10/30/2023   HDL 41 10/30/2023   LDLCALC 87 10/30/2023   TRIG 85 10/30/2023   CHOLHDL 3.4 06/30/2023     Controlled, no change in medication Updated lab needed at/ before next visit.   Obesity (BMI 30.0-34.9) improved Patient re-educated about  the importance of commitment to a  minimum of 150 minutes of exercise per  week as able.  The importance of healthy food choices with portion control discussed, as well as eating regularly and within a 12 hour window most days. The need to choose "clean , green" food 50 to 75% of the time is discussed, as well as to make water the primary drink and set a goal of 64 ounces water daily.       10/31/2023    8:35 AM 07/04/2023    8:25 AM 06/27/2023    9:37 AM  Weight /BMI  Weight 187 lb 192 lb 190 lb  Height 5\' 2"  (1.575 m) 5\' 2"  (1.575 m) 5\' 2"  (1.575 m)  BMI 34.2 kg/m2 35.12 kg/m2 34.75 kg/m2      Type 2 diabetes mellitus with vascular disease (HCC) Diabetes associated with hypertension  and hyperlipidemia Controlled well and managed by Endo  [name] is reminded of the importance of commitment to daily physical activity for 30 minutes or more, as able and the need to limit carbohydrate intake to 30 to 60 grams per meal to help with blood sugar control.   The need to take medication as prescribed, test blood sugar as directed, and to call between visits if there is a concern that blood sugar is uncontrolled is also discussed.   [name] is reminded of the importance of daily foot exam, annual eye examination, and good blood sugar, blood pressure and cholesterol control.     Latest Ref Rng & Units 10/30/2023    8:22 AM 06/30/2023    8:59 AM 06/27/2023    9:53 AM 06/27/2023    9:45 AM 02/20/2023   12:00 AM  Diabetic Labs  HbA1c 4.0 - 5.6 %    6.8  7.1      Microalbumin 0.0 - 1.9 mg/dL   69.6     Micro/Creat Ratio 0.0 - 30.0 mg/g   9.1     Chol 100 - 199 mg/dL 295  284      HDL >13 mg/dL 41  38      Calc LDL 0 - 99 mg/dL 87  79      Triglycerides 0 - 149 mg/dL 85  57      Creatinine 0.57 - 1.00 mg/dL 2.44  0.10         This result is from an external source.      10/31/2023    8:35 AM 07/04/2023    8:25 AM 06/27/2023    9:37 AM 02/28/2023    8:39 AM 02/20/2023   10:39 AM 01/30/2023    3:50 PM 01/01/2023    8:03 AM  BP/Weight  Systolic BP 123 125 122 118 120 116 129  Diastolic BP 82 83 78 79 70 68 85  Wt. (Lbs) 187 192 190 188.04 190 196.1 191.08  BMI 34.2 kg/m2 35.12 kg/m2 34.75 kg/m2 34.39 kg/m2 34.75 kg/m2 35.87 kg/m2 34.95 kg/m2      Latest Ref Rng & Units 04/18/2023   12:00 AM 01/01/2023    8:00 AM  Foot/eye exam completion dates  Eye Exam No Retinopathy No Retinopathy       Foot Form Completion   Done     This result is from an external source.

## 2023-11-07 ENCOUNTER — Ambulatory Visit: Payer: Medicaid Other | Admitting: Internal Medicine

## 2023-11-12 ENCOUNTER — Other Ambulatory Visit: Payer: Self-pay | Admitting: Family Medicine

## 2023-11-14 ENCOUNTER — Encounter: Payer: Self-pay | Admitting: Internal Medicine

## 2023-11-14 ENCOUNTER — Ambulatory Visit: Payer: Medicaid Other | Admitting: Internal Medicine

## 2023-11-14 VITALS — BP 120/80 | HR 97 | Ht 62.0 in | Wt 184.8 lb

## 2023-11-14 DIAGNOSIS — Z794 Long term (current) use of insulin: Secondary | ICD-10-CM

## 2023-11-14 DIAGNOSIS — Z7985 Long-term (current) use of injectable non-insulin antidiabetic drugs: Secondary | ICD-10-CM | POA: Diagnosis not present

## 2023-11-14 DIAGNOSIS — E1159 Type 2 diabetes mellitus with other circulatory complications: Secondary | ICD-10-CM

## 2023-11-14 DIAGNOSIS — Z7984 Long term (current) use of oral hypoglycemic drugs: Secondary | ICD-10-CM

## 2023-11-14 DIAGNOSIS — E782 Mixed hyperlipidemia: Secondary | ICD-10-CM

## 2023-11-14 DIAGNOSIS — E66811 Obesity, class 1: Secondary | ICD-10-CM | POA: Diagnosis not present

## 2023-11-14 LAB — POCT GLYCOSYLATED HEMOGLOBIN (HGB A1C): Hemoglobin A1C: 7 % — AB (ref 4.0–5.6)

## 2023-11-14 NOTE — Progress Notes (Signed)
 Patient ID: [name], female   DOB: [birth year], 48 y.o.   MRN: [id number]   HPI: [name] is a 48 y.o.-year-old female, returning for f/u for DM2, dx in 2005, insulin-dependent since 2018, uncontrolled, without long-term complications.  She previously saw Dr. Fransico Him. First visit with me 02/2018.  Last visit 5 months ago.  Interim history: No increased urination, blurry vision, nausea, chest pain.  Reviewed HbA1c levels: Lab Results  Component Value Date   HGBA1C 6.8 (A) 06/27/2023   HGBA1C 7.1 02/20/2023   HGBA1C 7.3 (H) 12/27/2022   She was previously on:  - Farxiga 5 mg daily in am - started 12/2018 >> 10 mg daily in a.m. - Ozempic - started 02/2018 >> 1 mg >> 2 mg weekly - Lantus 60 >> 66 units at bedtime >> 30 units in a.m. and 40 units at bedtime - Novolog 12 units 3x a day before meals -started 11/2020 >> 12 to 15 units before each meal Also, she was on Metformin ER 1000 mg twice a day with meals - started 05/2018 >> GI intolerance She stopped Invokana b/c yeast inf's. She stopped Janumet due to abdominal pain and diarrhea. Stopped Glipizide when we started Novolog.  Insulin pump: OmniPod DASH >> now OmniPod 5 since ~07/2023  CGM: Dexcom G6 - [place] approved 08/08/2021 >> Dexcom G7  Insulin: NovoLog >> Aspart  Supplies: Pharmacy  Pump settings: - basal rates: 12 am: 2 >> 2.2 >> 2.4 units/h - target: 120-120 - ICR 1:6 >> 1:5 - ISF: 25 - Insulin on Board: 4h TDD from basal insulin: 68-80% >> .Marland Kitchen. ~72% >> 66% (53 units) TDD from bolus insulin: 20-32% >> ... ~28% >> 34% (27 units) Total daily dose 80-100 units daily - extended bolusing: not using - changes infusion site: q3 days  Also: - Farxiga 10 mg daily in a.m. - Ozempic 2 mg weekly >> Mounjaro 7.5 mg weekly >> Ozempic 2 mg weekly (could not get back on Mounjaro)  Pt checks her sugars 4x a day with her Dexcom:  Previously:  Previously:  Lowest sugar was 70 >> 83 >> 80s; she has  hypoglycemia awareness in the 70s. Highest sugar was >350 >> .Marland KitchenMarland Kitchen 300s >> 296 >> 300s.  Continues to walk for exercise.  Glucometer: Truemetrix   Pt's meals are: - Breakfast: cereal + milk, muffin + fruit - Lunch: salad, sandwich, hamburger - Dinner: meat + veggies  - Snacks: fruit, crackers  -no CKD, last BUN/creatinine:  Lab Results  Component Value Date   BUN 11 10/30/2023   BUN 13 06/30/2023   CREATININE 0.98 10/30/2023   CREATININE 1.01 (H) 06/30/2023   Lab Results  Component Value Date   MICRALBCREAT 9.1 06/27/2023   MICRALBCREAT 20 06/12/2022   MICRALBCREAT 23 01/12/2021   MICRALBCREAT 13 01/07/2019   MICRALBCREAT 13 07/25/2017   MICRALBCREAT 23.1 10/16/2016   MICRALBCREAT 9 07/20/2015   MICRALBCREAT 17.1 07/14/2014   MICRALBCREAT 5.5 10/08/2012   MICRALBCREAT 4.3 10/30/2010  Allergic to ACEI.  + Hyperlipidemia; last set of lipids: Lab Results  Component Value Date   CHOL 144 10/30/2023   HDL 41 10/30/2023   LDLCALC 87 10/30/2023   TRIG 85 10/30/2023   CHOLHDL 3.4 06/30/2023  On pravastatin 10.  - last eye exam was 04/18/2023: No DR. [facility].  Marland Kitchen no numbness and tingling in her feet.  Latest foot exam: 01/01/2023.  Pt has FH of DM in M.  ROS: + see HPI  I reviewed pt's  medications, allergies, PMH, social hx, family hx, and changes were documented in the history of present illness. Otherwise, unchanged from my initial visit note.  Past Medical History:  Diagnosis Date   Constipation    GERD (gastroesophageal reflux disease)    Hypertension    Morbid obesity (HCC)    NIDDM (non-insulin dependent diabetes mellitus) 2005   Type II   OSA (obstructive sleep apnea) 06/24/2018   Past Surgical History:  Procedure Laterality Date   BACK SURGERY  05/25/2010   Dr,Kabell    CESAREAN SECTION  6/09, 2015   COLONOSCOPY WITH PROPOFOL N/A 09/06/2022   Procedure: COLONOSCOPY WITH PROPOFOL;  Surgeon: Dolores Frame, MD;  Location: [facility];   Service: Gastroenterology;  Laterality: N/A;  7:30am, asa 1-2   LUMBAR LAMINECTOMY/DECOMPRESSION MICRODISCECTOMY Left 09/13/2015   Procedure: Left Lumbar five-Sacral one microdiskectomy;  Surgeon: Coletta Memos, MD;  Location: [facility];  Service: Neurosurgery;  Laterality: Left;  Left Lumbar five-Sacral one microdiskectomy   POLYPECTOMY  09/06/2022   Procedure: POLYPECTOMY;  Surgeon: Dolores Frame, MD;  Location: [facility];  Service: Gastroenterology;;   Social History   Socioeconomic History   Marital status: Married    Spouse name: Not on file   Number of children: 2   Years of education: Not on file   Highest education level: Not on file  Occupational History   Occupation: unemployed   Tobacco Use   Smoking status: Never Smoker   Smokeless tobacco: Never Used  Substance and Sexual Activity   Alcohol use: No   Drug use: No   Current Outpatient Medications on File Prior to Visit  Medication Sig Dispense Refill   Accu-Chek Softclix Lancets lancets USE AS DIRECTED DAILY 100 each 2   blood glucose meter kit and supplies Dispense based on patient and insurance preference. Test blood glucose as directed by physician. (FOR ICD-10 E10.9, E11.9). 1 each 0   Continuous Glucose Sensor (DEXCOM G7 SENSOR) MISC APPLY 1 SENSOR EVERY 10 DAYS 9 each 4   dapagliflozin propanediol (FARXIGA) 10 MG TABS tablet Take 1 tablet (10 mg total) by mouth daily before breakfast. 90 tablet 3   Glucagon (BAQSIMI ONE PACK) 3 MG/DOSE POWD Use as needed for hypoglycemia 1 each 11   glucose blood (ACCU-CHEK GUIDE) test strip USE AS DIRECTED UP TO THREE TIMES DAILY 100 strip 5   hydrochlorothiazide (HYDRODIURIL) 25 MG tablet TAKE 1 TABLET BY MOUTH EVERY DAY 30 tablet 5   insulin aspart (NOVOLOG FLEXPEN) 100 UNIT/ML FlexPen Use parameters as an insulin to carb ratio of 1:6 and a sliding scale starting at 150 and adding 1 unit for every 25 above 150. 15 mL 1   Insulin Disposable Pump (OMNIPOD 5 G7 PODS,  GEN 5,) MISC 1 each by Does not apply route every other day. 45 each 3   Insulin Disposable Pump (OMNIPOD DASH PODS, GEN 4,) MISC Change pods every 2 days 10 each 1   insulin glargine (LANTUS) 100 UNIT/ML Solostar Pen Inject 38 Units into the skin at bedtime. (Patient not taking: Reported on 10/31/2023) 15 mL 2   Insulin Pen Needle 32G X 4 MM MISC Use to inject insulin when pump is not working 200 each 1   Misc. Devices KIT 1 each by Does not apply route daily. 1 kit 0   montelukast (SINGULAIR) 10 MG tablet TAKE 1 TABLET BY MOUTH AT BEDTIME 30 tablet 3   NOVOLOG 100 UNIT/ML injection USE UP TO 110 units a  DAY in THE insulin pump 100 mL 1   potassium chloride (KLOR-CON M) 10 MEQ tablet TAKE 1 TABLET BY MOUTH DAILY 30 tablet 5   pravastatin (PRAVACHOL) 10 MG tablet TAKE 1 TABLET BY MOUTH AT BEDTIME 30 tablet 1   Semaglutide, 2 MG/DOSE, (OZEMPIC, 2 MG/DOSE,) 8 MG/3ML SOPN Inject 2 mg into the skin once a week. 9 mL 3   No current facility-administered medications on file prior to visit.   Allergies  Allergen Reactions   Ace Inhibitors Cough   Amlodipine Besylate     REACTION: severe edema of legs   Family History  Problem Relation Age of Onset   Diabetes Father    Hypertension Father    Kidney disease Father    Kidney failure Father    Diabetes Mother    Hypertension Mother    PE: BP 120/80   Pulse 97   Ht 5\' 2"  (1.575 m)   Wt 184 lb 12.8 oz (83.8 kg)   SpO2 96%   BMI 33.80 kg/m  Wt Readings from Last 3 Encounters:  11/14/23 184 lb 12.8 oz (83.8 kg)  10/31/23 187 lb (84.8 kg)  07/04/23 192 lb (87.1 kg)   Constitutional: overweight, in NAD Eyes: no exophthalmos ENT: no masses palpated in neck, no cervical lymphadenopathy Cardiovascular: RRR, No MRG, + B LE edema Respiratory: CTA B Musculoskeletal: no deformities Skin: no rashes Neurological: no tremor with outstretched hands Diabetic Foot Exam - Simple   Simple Foot Form Diabetic Foot exam was performed with the  following findings: Yes 11/14/2023  9:22 AM  Visual Inspection No deformities, no ulcerations, no other skin breakdown bilaterally: Yes Sensation Testing Intact to touch and monofilament testing bilaterally: Yes Pulse Check Posterior Tibialis and Dorsalis pulse intact bilaterally: Yes Comments    ASSESSMENT: 1. DM2, insulin-dependent, uncontrolled, without long-term complications, but with hyperglycemia  2. HL  3. Obesity class I  PLAN:  1. Patient with longstanding, uncontrolled, type 2 diabetes, on a complex medication regimen with SGLT2 inhibitor, GLP-1 receptor agonist and insulin pump (OmniPod 5) integrated with the Dexcom G7 CGM, with improving control.  HbA1c at last visit was 6.8%, decreased from 7.1%.  At that time, sugars were fluctuating mostly in the upper half of the target range but with hyperglycemic spikes after meals.  She was preparing to start on the OmniPod 5 at that time, previously on the DASH, and we discussed that integrating the sensor with the pump will greatly help with diabetes control.  We also strengthened her insulin to carb ratios at that time. - At last visit she was not: Ozempic as she could not find Mounjaro at the pharmacy.  We previously sent a new prescription for [place] 2 [facility] in [place], [place], but she was not able to receive it so she continues on Ozempic.   CGM interpretation: -At today's visit, we reviewed her CGM downloads: It appears that 40% of values are in target range (goal >70%), while 60% are higher than 180 (goal <25%), and 0% are lower than 70 (goal <4%).  The calculated average blood sugar is 195.  The projected HbA1c for the next 3 months (GMI) is 8.0%. -Reviewing the CGM trends, sugars are higher than before, fluctuating around the upper limits of the target range, 180 mg/dL, but with higher blood sugars after meals especially breakfast and lunch.  I am surprised that the sugars are not lower, since she is on the automated mode now  100% of the time.  For now,  I advised her to strengthen her insulin to carb ratios and I also recommended to lower the target from 120 to 110.  Will continue the rest of the settings and also the [place] and Ozempic. - I suggested to:  Patient Instructions  Please continue: - Farxiga 10 mg before b'fast - Ozempic 2 mg weekly   Also: - basal rates: 12 am: 2.4 units/h - target: 120-120 >> 110-110 - ICR 1:5 >> 1:4 - ISF: 25 - Insulin on Board: 4h   Please return in 4 months.  - we checked her HbA1c: 7.0% (higher) - advised to check sugars at different times of the day - 4x a day, rotating check times - advised for yearly eye exams >> she is UTD - return to clinic in 4 months  2. HL - Review latest lipid panel from 10/2023: Fractions at goal: Lab Results  Component Value Date   CHOL 144 10/30/2023   HDL 41 10/30/2023   LDLCALC 87 10/30/2023   TRIG 85 10/30/2023   CHOLHDL 3.4 06/30/2023  -She continues pravastatin 10 mg daily without side effects  3. Obesity class I -continues on SGLT 2 inhibitor and GLP-1 receptor agonist which should also help with weight loss -She lost 17 pounds immediately after starting Ozempic and maintained the weight loss after switching to [facility] -Before last visit, she had to switch back to Ozempic.  Greggory Keen was not affordable anymore -Before last visit, she gained 2 pounds, previously gained 8 - She lost 8 pounds since last visit  Carlus Pavlov, MD PhD [facility]

## 2023-11-14 NOTE — Patient Instructions (Addendum)
 Please continue: - Farxiga 10 mg before b'fast - Ozempic 2 mg weekly   Also: - basal rates: 12 am: 2.4 units/h - target: 120-120 >> 110-110 - ICR 1:5 >> 1:4 - ISF: 25 - Insulin on Board: 4h   Please return in 4 months.

## 2023-12-10 ENCOUNTER — Other Ambulatory Visit: Payer: Self-pay | Admitting: Family Medicine

## 2023-12-16 ENCOUNTER — Other Ambulatory Visit: Payer: Self-pay | Admitting: Internal Medicine

## 2023-12-16 DIAGNOSIS — E1159 Type 2 diabetes mellitus with other circulatory complications: Secondary | ICD-10-CM

## 2023-12-21 ENCOUNTER — Other Ambulatory Visit: Payer: Self-pay | Admitting: Family Medicine

## 2023-12-21 DIAGNOSIS — E1159 Type 2 diabetes mellitus with other circulatory complications: Secondary | ICD-10-CM

## 2023-12-30 ENCOUNTER — Telehealth: Payer: Self-pay

## 2023-12-30 ENCOUNTER — Other Ambulatory Visit (HOSPITAL_COMMUNITY): Payer: Self-pay

## 2023-12-30 NOTE — Telephone Encounter (Signed)
 Pharmacy Patient Advocate Encounter   Received notification from CoverMyMeds that prior authorization for Ozempic  is required/requested.   Insurance verification completed.   The patient is insured through [facility] .   Per test claim: PA required and submitted KEY/EOC/Request #: [id number] due to The member recently filled this medication and will be able to return for their next refill according to their plan limits.

## 2024-01-10 ENCOUNTER — Other Ambulatory Visit: Payer: Self-pay | Admitting: Family Medicine

## 2024-01-16 ENCOUNTER — Institutional Professional Consult (permissible substitution) (INDEPENDENT_AMBULATORY_CARE_PROVIDER_SITE_OTHER): Admitting: Otolaryngology

## 2024-02-12 ENCOUNTER — Other Ambulatory Visit: Payer: Self-pay | Admitting: Family Medicine

## 2024-03-10 ENCOUNTER — Other Ambulatory Visit: Payer: Self-pay | Admitting: Family Medicine

## 2024-03-18 NOTE — Progress Notes (Addendum)
 Patient ID: [name], female   DOB: Feb 05, 1976, 48 y.o.   MRN: [id number]   HPI: [name] is a 48 y.o.-year-old female, returning for f/u for DM2, dx in 2005, insulin -dependent since 2018, uncontrolled, without long-term complications.  She previously saw Dr. Lenis. First visit with me 02/2018.  Last visit 4 months ago.  Interim history: No increased urination, blurry vision, nausea, chest pain. She had quite a few problems with pods and sensors coming off since last visit.  She is currently off the sensor after she lost the last one.  Reviewed HbA1c levels: Lab Results  Component Value Date   HGBA1C 7.0 (A) 11/14/2023   HGBA1C 6.8 (A) 06/27/2023   HGBA1C 7.1 02/20/2023   She was previously on:  - Farxiga  5 mg daily in am - started 12/2018 >> 10 mg daily in a.m. - Ozempic  - started 02/2018 >> 1 mg >> 2 mg weekly - Lantus  60 >> 66 units at bedtime >> 30 units in a.m. and 40 units at bedtime - Novolog  12 units 3x a day before meals -started 11/2020 >> 12 to 15 units before each meal Also, she was on Metformin  ER 1000 mg twice a day with meals - started 05/2018 >> GI intolerance She stopped Invokana  b/c yeast inf's. She stopped Janumet  due to abdominal pain and diarrhea. Stopped Glipizide  when we started Novolog .  Insulin  pump: OmniPod DASH >> now OmniPod 5 since ~07/2023  CGM: Dexcom G6 - [place] approved 08/08/2021 >> Dexcom G7  Insulin : NovoLog  >> Aspart  Supplies: Pharmacy  Pump settings: - basal rates: 12 am: 2.4 units/h - target: 120-120 >> 110-110 - ICR 1:5 >> 1:4 - ISF: 25 - Insulin  on Board: 4h TDD from basal insulin : 68-80% >> .SABRA. ~72% >> 66% (53 units) >> 63% (47 units) TDD from bolus insulin : 20-32% >> ... ~28% >> 34% (27 units) >> 37% (27 units) Total daily dose 80-100 >> 74-100 units daily - extended bolusing: not using - changes infusion site: q3 days  Also: - Farxiga  10 mg daily in a.m. - Ozempic  2 mg weekly >> Mounjaro  7.5 mg weekly  >> Ozempic  2 mg weekly (could not get back on Mounjaro )  Pt checks her sugars 4x a day with her Dexcom:   Previously:  Previously:   Lowest sugar was 70 >> 83 >> 80s >> 92; she has hypoglycemia awareness in the 70s. Highest sugar was >350 >> ...  296 >> 300s >> 330s  Continues to walk for exercise.  Glucometer: Truemetrix   Pt's meals are: - Breakfast: cereal + milk, muffin + fruit - Lunch: salad, sandwich, hamburger - Dinner: meat + veggies  - Snacks: fruit, crackers  -no CKD, last BUN/creatinine:  Lab Results  Component Value Date   BUN 11 10/30/2023   BUN 13 06/30/2023   CREATININE 0.98 10/30/2023   CREATININE 1.01 (H) 06/30/2023   Lab Results  Component Value Date   MICRALBCREAT 20 06/12/2022   MICRALBCREAT 23 01/12/2021   MICRALBCREAT 13 01/07/2019   MICRALBCREAT 13 07/25/2017   MICRALBCREAT 23.1 10/16/2016   MICRALBCREAT 9 07/20/2015   MICRALBCREAT 17.1 07/14/2014   MICRALBCREAT 5.5 10/08/2012   MICRALBCREAT 4.3 10/30/2010   MICRALBCREAT 13.9 08/14/2009  Allergic to ACEI.  + Hyperlipidemia; last set of lipids: Lab Results  Component Value Date   CHOL 144 10/30/2023   HDL 41 10/30/2023   LDLCALC 87 10/30/2023   TRIG 85 10/30/2023   CHOLHDL 3.4 06/30/2023  On pravastatin  10.  -  last eye exam was 04/18/2023: No DR. [facility].  [place] no numbness and tingling in her feet.  Latest foot exam: 11/14/2023.  Pt has FH of DM in M.  ROS: + see HPI  I reviewed pt's medications, allergies, PMH, social hx, family hx, and changes were documented in the history of present illness. Otherwise, unchanged from my initial visit note.  Past Medical History:  Diagnosis Date   Constipation    GERD (gastroesophageal reflux disease)    Hypertension    Morbid obesity (HCC)    NIDDM (non-insulin  dependent diabetes mellitus) 2005   Type II   OSA (obstructive sleep apnea) 06/24/2018   Past Surgical History:  Procedure Laterality Date   BACK SURGERY  05/25/2010    Dr,Kabell    CESAREAN SECTION  6/09, 2015   COLONOSCOPY WITH PROPOFOL  N/A 09/06/2022   Procedure: COLONOSCOPY WITH PROPOFOL ;  Surgeon: Eartha Angelia Sieving, MD;  Location: [facility];  Service: Gastroenterology;  Laterality: N/A;  7:30am, asa 1-2   LUMBAR LAMINECTOMY/DECOMPRESSION MICRODISCECTOMY Left 09/13/2015   Procedure: Left Lumbar five-Sacral one microdiskectomy;  Surgeon: Rockey Peru, MD;  Location: [facility];  Service: Neurosurgery;  Laterality: Left;  Left Lumbar five-Sacral one microdiskectomy   POLYPECTOMY  09/06/2022   Procedure: POLYPECTOMY;  Surgeon: Eartha Angelia Sieving, MD;  Location: [facility];  Service: Gastroenterology;;   Social History   Socioeconomic History   Marital status: Married    Spouse name: Not on file   Number of children: 2   Years of education: Not on file   Highest education level: Not on file  Occupational History   Occupation: unemployed   Tobacco Use   Smoking status: Never Smoker   Smokeless tobacco: Never Used  Substance and Sexual Activity   Alcohol use: No   Drug use: No   Current Outpatient Medications on File Prior to Visit  Medication Sig Dispense Refill   ACCU-CHEK GUIDE TEST test strip USE AS DIRECTED UP TO THREE TIMES DAILY 100 strip 5   Accu-Chek Softclix Lancets lancets USE AS DIRECTED DAILY 100 each 2   blood glucose meter kit and supplies Dispense based on patient and insurance preference. Test blood glucose as directed by physician. (FOR ICD-10 E10.9, E11.9). 1 each 0   Continuous Glucose Sensor (DEXCOM G7 SENSOR) MISC APPLY 1 SENSOR EVERY 10 DAYS 9 each 4   dapagliflozin  propanediol (FARXIGA ) 10 MG TABS tablet Take 1 tablet (10 mg total) by mouth daily before breakfast. 90 tablet 3   Glucagon  (BAQSIMI  ONE PACK) 3 MG/DOSE POWD Use as needed for hypoglycemia 1 each 11   hydrochlorothiazide  (HYDRODIURIL ) 25 MG tablet TAKE 1 TABLET BY MOUTH EVERY DAY 30 tablet 5   insulin  aspart (NOVOLOG  FLEXPEN) 100 UNIT/ML FlexPen  Use parameters as an insulin  to carb ratio of 1:6 and a sliding scale starting at 150 and adding 1 unit for every 25 above 150. 15 mL 1   Insulin  Disposable Pump (OMNIPOD 5 G7 PODS, GEN 5,) MISC 1 each by Does not apply route every other day. 45 each 3   insulin  glargine (LANTUS ) 100 UNIT/ML Solostar Pen Inject 38 Units into the skin at bedtime. 15 mL 2   Insulin  Pen Needle 32G X 4 MM MISC Use to inject insulin  when pump is not working 200 each 1   Misc. Devices KIT 1 each by Does not apply route daily. 1 kit 0   montelukast  (SINGULAIR ) 10 MG tablet TAKE 1 TABLET BY MOUTH AT BEDTIME  30 tablet 3   NOVOLOG  100 UNIT/ML injection USE UP TO 110 units a DAY in THE insulin  pump 100 mL 1   potassium chloride  (KLOR-CON  M) 10 MEQ tablet TAKE 1 TABLET BY MOUTH DAILY 30 tablet 5   pravastatin  (PRAVACHOL ) 10 MG tablet TAKE 1 TABLET BY MOUTH AT BEDTIME 30 tablet 1   Semaglutide , 2 MG/DOSE, (OZEMPIC , 2 MG/DOSE,) 8 MG/3ML SOPN Inject 2 mg into the skin once a week. 9 mL 3   No current facility-administered medications on file prior to visit.   Allergies  Allergen Reactions   Ace Inhibitors Cough   Amlodipine Besylate     REACTION: severe edema of legs   Family History  Problem Relation Age of Onset   Diabetes Father    Hypertension Father    Kidney disease Father    Kidney failure Father    Diabetes Mother    Hypertension Mother    PE: BP 120/80   Pulse 84   Ht 5' 2 (1.575 m)   Wt 189 lb 3.2 oz (85.8 kg)   SpO2 97%   BMI 34.61 kg/m  Wt Readings from Last 3 Encounters:  03/19/24 189 lb 3.2 oz (85.8 kg)  11/14/23 184 lb 12.8 oz (83.8 kg)  10/31/23 187 lb (84.8 kg)   Constitutional: overweight, in NAD Eyes: no exophthalmos ENT: no masses palpated in neck, no cervical lymphadenopathy Cardiovascular: RRR, No MRG, + B LE edema Respiratory: CTA B Musculoskeletal: no deformities Skin: no rashes Neurological: no tremor with outstretched hands  ASSESSMENT: 1. DM2, insulin -dependent,  uncontrolled, without long-term complications, but with hyperglycemia  2. HL  3. Obesity class I  PLAN:  1. Patient with longstanding, uncontrolled, type 2 diabetes, on a complex medication regimen with SGLT2 inhibitor, GLP-1 receptor agonist, and also insulin  pump (OmniPod 5 insulin  pump integrated with the Dexcom G7 CGM), with slightly worse control at last visit.  At that time, HbA1c was 7.0%, increased from 6.8%.  She continues on Farxiga  and Ozempic , which we did not change at last visit.  Since sugars are fluctuating around the upper limit of the target range with slightly higher values after meals, we discussed about low worrying her glucose target and strengthening her insulin  to carb ratios. -She continues on Ozempic  as she could not find Mounjaro  at the pharmacy.  We previously sent a new prescription for Mounjaro  [facility] in [place], [place] , but she was not able to receive it so she continues on Ozempic .   CGM interpretation: -At today's visit, we reviewed her CGM downloads: It appears that 53% of values are in target range (goal >70%), while 47% are higher than 180 (goal <25%), and 0% are lower than 70 (goal <4%).  The calculated average blood sugar is 182.  The projected HbA1c for the next 3 months (GMI) is 7.7%. -Reviewing the CGM trends, sugars appear to be fluctuating higher in the target range, with hyperglycemic excursions after meals.  She is doing a good job bolusing before the meals and entering carbs.  In this case, we discussed about increasing her basal rate throughout the day and also strengthening her insulin  to carb ratio slightly.  We can continue the rest of the regimen.  We gave her a sample of a Dexcom G7 CGM.  I did advise her to discuss with the company to send her more sensors.  I entered the pump setting changes into the pump for her.  I also switched her pump mode from limited to manual until  she can start back on the sensor and in that case, I advised her to switch to  full automated mode. - I suggested to:  Patient Instructions  Please continue: - Farxiga  10 mg before b'fast - Ozempic  2 mg weekly   Also: - basal rates: 12 am: 2.4 >> 2.6 units/h - target: 110-110 - ICR 1:4 >> 1:3.5 - ISF: 25 - Insulin  on Board: 4h  Please do the following approximately 15 minutes before every meal: - Enter carbs (C) - Enter sugars (S) - Start insulin  bolus (I)   Please return in 4 months.  - we checked her HbA1c: 7.2% (slightly higher) - advised to check sugars at different times of the day - 4x a day, rotating check times - advised for yearly eye exams >> she is UTD - return to clinic in 3-4 months  2. HL - Lipid panel reviewed from 10/2023: All fractions at goal: Lab Results  Component Value Date   CHOL 144 10/30/2023   HDL 41 10/30/2023   LDLCALC 87 10/30/2023   TRIG 85 10/30/2023   CHOLHDL 3.4 06/30/2023  -She continues on pravastatin  40 mg daily without side effects  3. Obesity class I - will continue the SGLT2 inhibitor and GLP-1 receptor agonist, which should also help with weight loss - She lost 17 pounds immediately after starting Ozempic  and maintained the weight loss after switching to Mounjaro , but then started to gain back - Mounjaro  became not affordable so she switched back to Ozempic  - She lost 8 pounds before last visit but gained 5 pounds since then.   Component     Latest Ref Rng 03/19/2024  Hemoglobin A1C     4.0 - 5.6 % 7.2 !   Microalb, Ur     mg/dL 9.3   MICROALB/CREAT RATIO     <30 mg/g creat 61 (H)   Creatinine, Urine     20 - 275 mg/dL 846   POC Glucose     70 - 99 mg/dl 841 !   Urinary proteins are slightly elevated.  Will continue Farxiga  for now and plan to repeat the test at next visit.  Lela Fendt, MD PhD [facility]

## 2024-03-19 ENCOUNTER — Telehealth: Payer: Self-pay

## 2024-03-19 ENCOUNTER — Encounter: Payer: Self-pay | Admitting: Internal Medicine

## 2024-03-19 ENCOUNTER — Ambulatory Visit: Admitting: Internal Medicine

## 2024-03-19 VITALS — BP 120/80 | HR 84 | Ht 62.0 in | Wt 189.2 lb

## 2024-03-19 DIAGNOSIS — E782 Mixed hyperlipidemia: Secondary | ICD-10-CM | POA: Diagnosis not present

## 2024-03-19 DIAGNOSIS — Z7985 Long-term (current) use of injectable non-insulin antidiabetic drugs: Secondary | ICD-10-CM

## 2024-03-19 DIAGNOSIS — E1159 Type 2 diabetes mellitus with other circulatory complications: Secondary | ICD-10-CM

## 2024-03-19 DIAGNOSIS — Z7984 Long term (current) use of oral hypoglycemic drugs: Secondary | ICD-10-CM | POA: Diagnosis not present

## 2024-03-19 DIAGNOSIS — E66811 Obesity, class 1: Secondary | ICD-10-CM

## 2024-03-19 LAB — POCT GLYCOSYLATED HEMOGLOBIN (HGB A1C): Hemoglobin A1C: 7.2 % — AB (ref 4.0–5.6)

## 2024-03-19 LAB — GLUCOSE, POCT (MANUAL RESULT ENTRY): POC Glucose: 158 mg/dL — AB (ref 70–99)

## 2024-03-19 MED ORDER — DEXCOM G7 SENSOR MISC: Status: DC

## 2024-03-19 NOTE — Addendum Note (Signed)
 Addended by: CLEOTILDE ROLIN RAMAN on: 03/19/2024 08:35 AM   Modules accepted: Orders

## 2024-03-19 NOTE — Patient Instructions (Addendum)
 Please continue: - Farxiga  10 mg before b'fast - Ozempic  2 mg weekly   Also: - basal rates: 12 am: 2.4 >> 2.6 units/h - target: 110-110 - ICR 1:4 >> 1:3.5 - ISF: 25 - Insulin  on Board: 4h  Please do the following approximately 15 minutes before every meal: - Enter carbs (C) - Enter sugars (S) - Start insulin  bolus (I)   Please return in 4 months.

## 2024-03-19 NOTE — Telephone Encounter (Signed)
 Sample  Device/Supplies: Dexcom G7 Quantity:1 ONU:[id number] EXP:06/11/2025    Rolin GORMAN Pinal 8:36 AM 03/19/2024   Requested Prescriptions   Signed Prescriptions Disp Refills   Continuous Glucose Sensor (DEXCOM G7 SENSOR) MISC      Sig: Use to check glucose continuously, change sensor every 10 days Quantity:1 ONU:[id number] EXP:06/11/2025    Authorizing Provider: TRIXIE FILE    Ordering User: PINAL ROLIN GORMAN

## 2024-03-20 LAB — MICROALBUMIN / CREATININE URINE RATIO
Creatinine, Urine: 153 mg/dL (ref 20–275)
Microalb Creat Ratio: 61 mg/g{creat} — ABNORMAL HIGH (ref <30)
Microalb, Ur: 9.3 mg/dL

## 2024-03-22 ENCOUNTER — Ambulatory Visit: Payer: Self-pay | Admitting: Internal Medicine

## 2024-04-09 ENCOUNTER — Other Ambulatory Visit: Payer: Self-pay | Admitting: Internal Medicine

## 2024-04-09 DIAGNOSIS — E119 Type 2 diabetes mellitus without complications: Secondary | ICD-10-CM

## 2024-05-07 ENCOUNTER — Encounter: Admitting: Family Medicine

## 2024-05-10 ENCOUNTER — Other Ambulatory Visit: Payer: Self-pay | Admitting: Family Medicine

## 2024-05-16 ENCOUNTER — Other Ambulatory Visit: Payer: Self-pay | Admitting: Internal Medicine

## 2024-05-16 DIAGNOSIS — E1159 Type 2 diabetes mellitus with other circulatory complications: Secondary | ICD-10-CM

## 2024-05-26 ENCOUNTER — Encounter (INDEPENDENT_AMBULATORY_CARE_PROVIDER_SITE_OTHER): Payer: Self-pay | Admitting: Gastroenterology

## 2024-06-09 ENCOUNTER — Other Ambulatory Visit: Payer: Self-pay | Admitting: Family Medicine

## 2024-06-09 ENCOUNTER — Other Ambulatory Visit: Payer: Self-pay | Admitting: Internal Medicine

## 2024-07-05 DIAGNOSIS — Z0000 Encounter for general adult medical examination without abnormal findings: Secondary | ICD-10-CM | POA: Diagnosis not present

## 2024-07-05 DIAGNOSIS — Z803 Family history of malignant neoplasm of breast: Secondary | ICD-10-CM | POA: Diagnosis not present

## 2024-07-08 ENCOUNTER — Other Ambulatory Visit: Payer: Self-pay | Admitting: Family Medicine

## 2024-07-09 ENCOUNTER — Ambulatory Visit (HOSPITAL_COMMUNITY)

## 2024-07-12 ENCOUNTER — Encounter: Payer: Self-pay | Admitting: Family Medicine

## 2024-07-16 ENCOUNTER — Ambulatory Visit: Admitting: Internal Medicine

## 2024-07-19 ENCOUNTER — Other Ambulatory Visit: Payer: Self-pay | Admitting: Internal Medicine

## 2024-07-21 DIAGNOSIS — I10 Essential (primary) hypertension: Secondary | ICD-10-CM | POA: Diagnosis not present

## 2024-07-22 ENCOUNTER — Ambulatory Visit (HOSPITAL_COMMUNITY)
Admission: RE | Admit: 2024-07-22 | Discharge: 2024-07-22 | Disposition: A | Discharge status: Home / Self Care | Source: Ambulatory Visit | Attending: Family Medicine | Admitting: Family Medicine

## 2024-07-22 ENCOUNTER — Encounter (HOSPITAL_COMMUNITY): Payer: Self-pay

## 2024-07-22 DIAGNOSIS — Z1231 Encounter for screening mammogram for malignant neoplasm of breast: Secondary | ICD-10-CM | POA: Diagnosis not present

## 2024-07-22 LAB — LIPID PANEL W/O CHOL/HDL RATIO
Cholesterol, Total: 138 mg/dL (ref 100–199)
HDL: 37 mg/dL — ABNORMAL LOW (ref >39)
LDL Chol Calc (NIH): 84 mg/dL (ref 0–99)
Triglycerides: 88 mg/dL (ref 0–149)
VLDL Cholesterol Cal: 17 mg/dL (ref 5–40)

## 2024-07-22 LAB — CMP14+EGFR
ALT: 17 IU/L (ref 0–32)
AST: 15 IU/L (ref 0–40)
Albumin: 3.8 g/dL — ABNORMAL LOW (ref 3.9–4.9)
Alkaline Phosphatase: 93 IU/L (ref 41–116)
BUN/Creatinine Ratio: 12 (ref 9–23)
BUN: 11 mg/dL (ref 6–24)
Bilirubin Total: 0.4 mg/dL (ref 0.0–1.2)
CO2: 26 mmol/L (ref 20–29)
Calcium: 9 mg/dL (ref 8.7–10.2)
Chloride: 99 mmol/L (ref 96–106)
Creatinine, Ser: 0.93 mg/dL (ref 0.57–1.00)
Globulin, Total: 3 g/dL (ref 1.5–4.5)
Glucose: 151 mg/dL — ABNORMAL HIGH (ref 70–99)
Potassium: 3.5 mmol/L (ref 3.5–5.2)
Sodium: 139 mmol/L (ref 134–144)
Total Protein: 6.8 g/dL (ref 6.0–8.5)
eGFR: 76 mL/min/1.73 (ref >59)

## 2024-07-23 ENCOUNTER — Ambulatory Visit: Admitting: Family Medicine

## 2024-07-23 VITALS — BP 142/88 | HR 97 | Resp 18 | Ht 62.0 in | Wt 190.0 lb

## 2024-07-23 DIAGNOSIS — E559 Vitamin D deficiency, unspecified: Secondary | ICD-10-CM

## 2024-07-23 DIAGNOSIS — Z23 Encounter for immunization: Secondary | ICD-10-CM

## 2024-07-23 DIAGNOSIS — I10 Essential (primary) hypertension: Secondary | ICD-10-CM | POA: Diagnosis not present

## 2024-07-23 DIAGNOSIS — Z0001 Encounter for general adult medical examination with abnormal findings: Secondary | ICD-10-CM | POA: Insufficient documentation

## 2024-07-23 NOTE — Assessment & Plan Note (Signed)
 Elevated blood pressure , re eval in 2 to 3 months if persists needs to start medication and EKG needed also DASH diet and commitment to daily physical activity for a minimum of 30 minutes discussed and encouraged, as a part of hypertension management. The importance of attaining a healthy weight is also discussed.     07/23/2024    9:20 AM 07/23/2024    8:41 AM 03/19/2024    8:14 AM 11/14/2023    9:24 AM 10/31/2023    8:35 AM 07/04/2023    8:25 AM 06/27/2023    9:37 AM  BP/Weight  Systolic BP 142 160 120 120 123 125 122  Diastolic BP 88 92 80 80 82 83 78  Wt. (Lbs)  190 189.2 184.8 187 192 190  BMI  34.75 kg/m2 34.61 kg/m2 33.8 kg/m2 34.2 kg/m2 35.12 kg/m2 34.75 kg/m2

## 2024-07-23 NOTE — Progress Notes (Signed)
 [name]     MRN: [id number]      DOB: [birth year]  Chief Complaint  Patient presents with   Annual Exam    Cpe     HPI: Patient is in for annual physical exam. No other health concerns are expressed or addressed at the visit. Recent labs,  are reviewed. Immunization is reviewed , and  updated if needed.   PE: BP (!) 142/88   Pulse 97   Resp 18   Ht 5' 2 (1.575 m)   Wt 190 lb (86.2 kg)   LMP 06/23/2024 (Approximate)   SpO2 95%   BMI 34.75 kg/m   Pleasant  female, alert and oriented x 3, in no cardio-pulmonary distress. Afebrile. HEENT No facial trauma or asymetry. Sinuses non tender.  Extra occullar muscles intact.. External ears normal, . Neck: supple, no adenopathy,JVD or thyromegaly.No bruits.  Chest: Clear to ascultation bilaterally.No crackles or wheezes. Non tender to palpation  Breast: No asymetry,no masses or lumps. No tenderness. No nipple discharge or inversion. No axillary or supraclavicular adenopathy  Cardiovascular system; Heart sounds normal,  S1 and  S2 ,no S3.  No murmur, or thrill. Apical beat not displaced Peripheral pulses normal.  Abdomen: Soft, non tender, no organomegaly or masses. No bruits. Bowel sounds normal. No guarding, tenderness or rebound.   GU: External genitalia normal female genitalia , normal female distribution of hair. No lesions. Urethral meatus normal in size, no  Prolapse, no lesions visibly  Present. Bladder non tender. Vagina pink and moist , with no visible lesions , discharge present . Adequate pelvic support no  cystocele or rectocele noted Cervix pink and appears healthy, no lesions or ulcerations noted, no discharge noted from os Uterus normal size, no adnexal masses, no cervical motion or adnexal tenderness.   Musculoskeletal exam: Full ROM of spine, hips , shoulders and knees. No deformity ,swelling or crepitus noted. No muscle wasting or atrophy.   Neurologic: Cranial nerves 2 to  12 intact. Power, tone ,sensation and reflexes normal throughout. No disturbance in gait. No tremor.  Skin: Intact, no ulceration, erythema , scaling or rash noted. Pigmentation normal throughout  Psych; Normal mood and affect. Judgement and concentration normal   Assessment & Plan:  Encounter for Medicare annual examination with abnormal findings Annual exam as documented. Counseling done  re healthy lifestyle involving commitment to 150 minutes exercise per week, heart healthy diet, and attaining healthy weight.The importance of adequate sleep also discussed. Regular seat belt use and home safety, is also discussed. Changes in health habits are decided on by the patient with goals and time frames  set for achieving them. Immunization and cancer screening needs are specifically addressed at this visit.   Essential hypertension Elevated blood pressure , re eval in 2 to 3 months if persists needs to start medication and EKG needed also DASH diet and commitment to daily physical activity for a minimum of 30 minutes discussed and encouraged, as a part of hypertension management. The importance of attaining a healthy weight is also discussed.     07/23/2024    9:20 AM 07/23/2024    8:41 AM 03/19/2024    8:14 AM 11/14/2023    9:24 AM 10/31/2023    8:35 AM 07/04/2023    8:25 AM 06/27/2023    9:37 AM  BP/Weight  Systolic BP 142 160 120 120 123 125 122  Diastolic BP 88 92 80 80 82 83 78  Wt. (Lbs)  190 189.2 184.8 187  192 190  BMI  34.75 kg/m2 34.61 kg/m2 33.8 kg/m2 34.2 kg/m2 35.12 kg/m2 34.75 kg/m2

## 2024-07-23 NOTE — Patient Instructions (Addendum)
 F/u in 8 to 10 weeks , re evaluate b lood pressure    NURSE VISIT IN 1 WEEK FOR PNEUMONIA VACCINE  Please get Covid vaccine in next 2 to 3 weeks   Non fasting bmp, TSH and vit D 3 to 5 days before next visit  No medication changes  Change food choice and commit to dAILY EXERCISE PLEASE, BLOOD PRESSURE IS ELEVATED  Best for 2026  Thanks for choosing [facility], we consider it a privelige to serve you.

## 2024-07-23 NOTE — Assessment & Plan Note (Signed)

## 2024-08-02 ENCOUNTER — Ambulatory Visit

## 2024-08-06 ENCOUNTER — Ambulatory Visit: Admitting: Internal Medicine

## 2024-08-06 ENCOUNTER — Encounter: Payer: Self-pay | Admitting: Internal Medicine

## 2024-08-06 VITALS — BP 120/80 | HR 88 | Ht 62.0 in | Wt 190.8 lb

## 2024-08-06 DIAGNOSIS — E782 Mixed hyperlipidemia: Secondary | ICD-10-CM | POA: Diagnosis not present

## 2024-08-06 DIAGNOSIS — Z7984 Long term (current) use of oral hypoglycemic drugs: Secondary | ICD-10-CM

## 2024-08-06 DIAGNOSIS — Z794 Long term (current) use of insulin: Secondary | ICD-10-CM | POA: Diagnosis not present

## 2024-08-06 DIAGNOSIS — Z7985 Long-term (current) use of injectable non-insulin antidiabetic drugs: Secondary | ICD-10-CM | POA: Diagnosis not present

## 2024-08-06 DIAGNOSIS — E66811 Obesity, class 1: Secondary | ICD-10-CM

## 2024-08-06 DIAGNOSIS — E1159 Type 2 diabetes mellitus with other circulatory complications: Secondary | ICD-10-CM | POA: Diagnosis not present

## 2024-08-06 LAB — POCT GLYCOSYLATED HEMOGLOBIN (HGB A1C): Hemoglobin A1C: 7.4 % — AB (ref 4.0–5.6)

## 2024-08-06 NOTE — Progress Notes (Signed)
 Patient ID: [name], female   DOB: [birth year], 48 y.o.   MRN: [id number]   HPI: [name] is a 48 y.o.-year-old female, returning for f/u for DM2, dx in 2005, insulin -dependent since 2018, uncontrolled, without long-term complications.  She previously saw Dr. Lenis. First visit with me 02/2018.  Last visit 4 months ago.  Interim history: No increased urination, nausea, chest pain. Has blurry vision when reading >> uses readers. At last visit she had problems with pods and sensors coming off since last visit.  She was off the sensor at that time but she was able to restart. She had a URI.   Reviewed HbA1c levels: Lab Results  Component Value Date   HGBA1C 7.2 (A) 03/19/2024   HGBA1C 7.0 (A) 11/14/2023   HGBA1C 6.8 (A) 06/27/2023   She was previously on:  - Farxiga  5 mg daily in am - started 12/2018 >> 10 mg daily in a.m. - Ozempic  - started 02/2018 >> 1 mg >> 2 mg weekly - Lantus  60 >> 66 units at bedtime >> 30 units in a.m. and 40 units at bedtime - Novolog  12 units 3x a day before meals -started 11/2020 >> 12 to 15 units before each meal Also, she was on Metformin  ER 1000 mg twice a day with meals - started 05/2018 >> GI intolerance She stopped Invokana  b/c yeast inf's. She stopped Janumet  due to abdominal pain and diarrhea. Stopped Glipizide  when we started Novolog .  Insulin  pump: OmniPod DASH >> now OmniPod 5 since ~07/2023  CGM: Dexcom G6 - [place] 08/08/2021 >> Dexcom G7  Insulin : NovoLog  >> Aspart  Supplies: Pharmacy  Pump settings: - basal rates: 12 am: 2.4 >> 2.6 units/h - target: 110-110 - ICR 1:4 >> 1:3.5 - ISF: 25 - Insulin  on Board: 4h TDD from basal insulin : 68-80% >> .SABRA. ~72% >> 66% (53 units) >> 63% (47 units) >> 70% (50 units) TDD from bolus insulin : 20-32% >> ... ~28% >> 34% (27 units) >> 37% (27 units) >> 30% (21 units) Total daily dose 80-100 >> 74-100 units daily - extended bolusing: not using - changes infusion site: q3  days  Also: - Farxiga  10 mg daily in a.m. - Ozempic  2 mg weekly >> Mounjaro  7.5 mg weekly >> Ozempic  2 mg weekly (could not get back on Mounjaro )  Pt checks her sugars 4x a day with her Dexcom:  Previously:   Previously:   Lowest sugar was  80s >> 92 >> 70; she has hypoglycemia awareness in the 70s. Highest sugar was  300s >> 330s>> 300x1 (pod leakage).  Continues to walk for exercise.  Glucometer: Truemetrix   Pt's meals are: - Breakfast: cereal + milk, muffin + fruit - Lunch: salad, sandwich, hamburger - Dinner: meat + veggies  - Snacks: fruit, crackers  -no CKD, last BUN/creatinine:  Lab Results  Component Value Date   BUN 11 07/21/2024   BUN 11 10/30/2023   CREATININE 0.93 07/21/2024   CREATININE 0.98 10/30/2023   Lab Results  Component Value Date   MICRALBCREAT 61 (H) 03/19/2024   MICRALBCREAT 20 06/12/2022   MICRALBCREAT 23 01/12/2021   MICRALBCREAT 13 01/07/2019   MICRALBCREAT 13 07/25/2017   MICRALBCREAT 23.1 10/16/2016   MICRALBCREAT 9 07/20/2015   MICRALBCREAT 17.1 07/14/2014   MICRALBCREAT 5.5 10/08/2012   MICRALBCREAT 4.3 10/30/2010  Allergic to ACEI.  + Hyperlipidemia; last set of lipids: Lab Results  Component Value Date   CHOL 138 07/21/2024   HDL 37 (L) 07/21/2024  LDLCALC 84 07/21/2024   TRIG 88 07/21/2024   CHOLHDL 3.4 06/30/2023  On pravastatin  10.  - last eye exam was 04/18/2023: No DR. [facility] >> not taking her insurance anymore >> will change.  - no numbness and tingling in her feet.  Latest foot exam: 11/14/2023.  Pt has FH of DM in M.  ROS: + see HPI  I reviewed pt's medications, allergies, PMH, social hx, family hx, and changes were documented in the history of present illness. Otherwise, unchanged from my initial visit note.  Past Medical History:  Diagnosis Date   Allergy 2008   Constipation    GERD (gastroesophageal reflux disease)    Hypertension    Morbid obesity (HCC)    NIDDM (non-insulin  dependent diabetes  mellitus) 2005   Type II   OSA (obstructive sleep apnea) 06/24/2018   Past Surgical History:  Procedure Laterality Date   BACK SURGERY  05/25/2010   Dr,Kabell    CESAREAN SECTION  01/18/2014   COLONOSCOPY WITH PROPOFOL  N/A 09/06/2022   Procedure: COLONOSCOPY WITH PROPOFOL ;  Surgeon: Eartha Angelia Sieving, MD;  Location: [facility];  Service: Gastroenterology;  Laterality: N/A;  7:30am, asa 1-2   LUMBAR LAMINECTOMY/DECOMPRESSION MICRODISCECTOMY Left 09/13/2015   Procedure: Left Lumbar five-Sacral one microdiskectomy;  Surgeon: Rockey Peru, MD;  Location: [facility];  Service: Neurosurgery;  Laterality: Left;  Left Lumbar five-Sacral one microdiskectomy   POLYPECTOMY  09/06/2022   Procedure: POLYPECTOMY;  Surgeon: Eartha Angelia Sieving, MD;  Location: [facility];  Service: Gastroenterology;;   TUBAL LIGATION  04/16/2014   Social History   Socioeconomic History   Marital status: Married    Spouse name: Not on file   Number of children: 2   Years of education: Not on file   Highest education level: Not on file  Occupational History   Occupation: unemployed   Tobacco Use   Smoking status: Never Smoker   Smokeless tobacco: Never Used  Substance and Sexual Activity   Alcohol use: No   Drug use: No   Current Outpatient Medications on File Prior to Visit  Medication Sig Dispense Refill   ACCU-CHEK GUIDE TEST test strip USE AS DIRECTED UP TO THREE TIMES DAILY 100 strip 5   Accu-Chek Softclix Lancets lancets USE AS DIRECTED DAILY 100 each 2   blood glucose meter kit and supplies Dispense based on patient and insurance preference. Test blood glucose as directed by physician. (FOR ICD-10 E10.9, E11.9). 1 each 0   Continuous Glucose Sensor (DEXCOM G7 SENSOR) MISC APPLY 1 SENSOR EVERY 10 DAYS 9 each 4   Continuous Glucose Sensor (DEXCOM G7 SENSOR) MISC Use to check glucose continuously, change sensor every 10 days Quantity:1 LOT:[phone] EXP:06/11/2025     FARXIGA  10  MG TABS tablet TAKE 1 TABLET BY MOUTH DAILY BEFORE BREAKFAST 90 tablet 3   Glucagon  (BAQSIMI  ONE PACK) 3 MG/DOSE POWD Use as needed for hypoglycemia 1 each 11   hydrochlorothiazide  (HYDRODIURIL ) 25 MG tablet TAKE 1 TABLET BY MOUTH EVERY DAY 30 tablet 5   insulin  aspart (NOVOLOG  FLEXPEN) 100 UNIT/ML FlexPen Use parameters as an insulin  to carb ratio of 1:6 and a sliding scale starting at 150 and adding 1 unit for every 25 above 150. 15 mL 1   Insulin  Disposable Pump (OMNIPOD 5 DEXG7G6 PODS GEN 5) MISC USE AS DIRECTED AND CHANGE EVERY TWO DAYS 45 each 3   insulin  glargine (LANTUS ) 100 UNIT/ML Solostar Pen Inject 38 Units into the skin at bedtime. 15  mL 2   Insulin  Pen Needle 32G X 4 MM MISC Use to inject insulin  when pump is not working 200 each 1   Misc. Devices KIT 1 each by Does not apply route daily. 1 kit 0   montelukast  (SINGULAIR ) 10 MG tablet TAKE 1 TABLET BY MOUTH AT BEDTIME 30 tablet 3   NOVOLOG  100 UNIT/ML injection USE UP TO 110 units a DAY in THE insulin  pump 100 mL 1   OZEMPIC , 2 MG/DOSE, 8 MG/3ML SOPN INJECT 2 MG UNDER THE SKIN ONCE A WEEK 9 mL 3   potassium chloride  (KLOR-CON  M) 10 MEQ tablet TAKE 1 TABLET BY MOUTH DAILY 30 tablet 5   pravastatin  (PRAVACHOL ) 10 MG tablet TAKE 1 TABLET BY MOUTH AT BEDTIME 30 tablet 1   No current facility-administered medications on file prior to visit.   Allergies  Allergen Reactions   Ace Inhibitors Cough   Amlodipine Besylate     REACTION: severe edema of legs   Family History  Problem Relation Age of Onset   Diabetes Father    Hypertension Father    Kidney disease Father    Kidney failure Father    Diabetes Mother    Hypertension Mother    PE: BP 120/80   Pulse 88   Ht 5' 2 (1.575 m)   Wt 190 lb 12.8 oz (86.5 kg)   LMP 06/23/2024 (Approximate)   SpO2 96%   BMI 34.90 kg/m  Wt Readings from Last 3 Encounters:  08/06/24 190 lb 12.8 oz (86.5 kg)  07/23/24 190 lb (86.2 kg)  03/19/24 189 lb 3.2 oz (85.8 kg)   Constitutional:  overweight, in NAD Eyes: no exophthalmos ENT: no masses palpated in neck, no cervical lymphadenopathy Cardiovascular: RRR, No MRG, + mild B LE edema Respiratory: CTA B Musculoskeletal: no deformities Skin: no rashes Neurological: no tremor with outstretched hands  ASSESSMENT: 1. DM2, insulin -dependent, uncontrolled, without long-term complications, but with hyperglycemia  2. HL  3. Obesity class I  PLAN:  1. Patient with longstanding, uncontrolled, type 2 diabetes, on a complex medication regimen with SGLT2 inhibitor, GLP-1 receptor agonist and OmniPod 5 insulin  pump integrated with a Dexcom G7 CGM, with slightly worse control at last visit, when HbA1c returned 7.2%.  At that time, she was on Ozempic  as she was not able to obtain Mounjaro .  Reviewing the CGM trends, sugars were fluctuating higher in the target range, with hyperglycemic excursions after meals.  She was doing a good job bolusing before the meals and entering carbs.  Therefore, we increased her basal rate and insulin  to carb ratios.  I entered the pump settings into the pump for her.  I also switched her pump note from limited to manual as she was out of her CGM until she could start back on the sensor, and I advised her to switch to full automated mode afterwards. CGM interpretation: -At today's visit, we reviewed her CGM downloads: It appears that 58% of values are in target range (goal >70%), while 42% are higher than 180 (goal <25%), and 0% are lower than 70 (goal <4%).  The calculated average blood sugar is 176.  The projected HbA1c for the next 3 months (GMI) is 7.5%. -Reviewing the CGM trends, sugars appear to be fluctuating high in the target range, fluctuating around the upper limit of the target interval, 180 mg/dL.  Sugars increase consistently after every meal despite the fact that she is entering carbs and bolusing appropriately before each meal.  In this case  we discussed that she is maybe not entering all of the carbs  into the pump and I advised her to work on this.  We discussed that for low carb meals, she may need 20 to 30 to 40% of the proteins (grams) as carbs into the pump to bolus for them.  I also advised her to strengthen her insulin  to carb ratios and we increase her maximum basal rate from 4 units an hour to 5 units an hour.  I supervised the patient entering the pump setting changes into her pump. - I suggested to:  Patient Instructions  Please continue: - Farxiga  10 mg before b'fast - Ozempic  2 mg weekly   Also: - basal rates: 12 am: 2.6 Max basal rate: 4 >> 5 - target: 110-110 - ICR 1:3.5 >> 1:3 - ISF: 25 - Insulin  on Board: 4h  Please do the following approximately 15 minutes before every meal: - Enter carbs (C) - Enter sugars (S) - Start insulin  bolus (I)   For a low carb meal, try to enter 30-40% of the protein amount (in grams) as carbs.  Please return in 4 months.  - we checked her HbA1c: 7.4% (higher) - advised to check sugars at different times of the day - 4x a day, rotating check times - advised for yearly eye exams >> she is due, but needs to change her eye doctor as her previous ophthalmologist office is not taking her insurance anymore. -Her ACR was slightly elevated at last visit, at 34 -we will repeat this at next visit.  At today's visit, she is menstruating. - return to clinic in 4 months  2. HL - Her latest lipid panel was reviewed from earlier this month: HDL slightly low, otherwise fractions at goal: Lab Results  Component Value Date   CHOL 138 07/21/2024   HDL 37 (L) 07/21/2024   LDLCALC 84 07/21/2024   TRIG 88 07/21/2024   CHOLHDL 3.4 06/30/2023  -She continues pravastatin  10 mg daily without side effects  3. Obesity class I - will continue the SGLT2 inhibitor and GLP-1 receptor agonist, which should also help with weight loss - She lost 17 pounds immediately after starting Ozempic  and maintained the weight loss after switching to Mounjaro , but then  started to gain back - Mounjaro  was not affordable so she is back on Ozempic  - She gained 5 pounds before last visit, previously lost 8 - At today's visit, she gained 1 pound since last visit  Lela Fendt, MD PhD [facility] Endocrinology

## 2024-08-06 NOTE — Patient Instructions (Addendum)
 Please continue: - Farxiga  10 mg before b'fast - Ozempic  2 mg weekly   Also: - basal rates: 12 am: 2.6 Max basal rate: 4 >> 5 - target: 110-110 - ICR 1:3.5 >> 1:3 - ISF: 25 - Insulin  on Board: 4h  Please do the following approximately 15 minutes before every meal: - Enter carbs (C) - Enter sugars (S) - Start insulin  bolus (I)   For a low carb meal, try to enter 30-40% of the protein amount (in grams) as carbs.  Please return in 4 months.

## 2024-09-01 ENCOUNTER — Encounter: Payer: Self-pay | Admitting: Internal Medicine

## 2024-09-02 ENCOUNTER — Telehealth: Payer: Self-pay

## 2024-09-02 MED ORDER — DEXCOM G7 SENSOR MISC: Status: DC

## 2024-09-02 NOTE — Telephone Encounter (Signed)
 Sample  Device/Supplies: Dexcom G7 Quantity:1 ONU:[id number] EXP:09/11/25

## 2024-09-03 NOTE — Telephone Encounter (Signed)
 Patient came in to office today and picked up 1 sample of Dexcom G7 sensor.

## 2024-09-07 ENCOUNTER — Telehealth: Payer: Self-pay | Admitting: Pharmacy Technician

## 2024-09-07 ENCOUNTER — Other Ambulatory Visit (HOSPITAL_COMMUNITY): Payer: Self-pay

## 2024-09-07 NOTE — Telephone Encounter (Signed)
 Pharmacy Patient Advocate Encounter  Received notification from [employer] that Prior Authorization for Dexcom G7 sensor has been APPROVED from 09/07/2024 to 09/07/2025   PA #/Case ID/Reference #: [id number]

## 2024-09-07 NOTE — Telephone Encounter (Signed)
 Pharmacy Patient Advocate Encounter   Received notification from [facility] that prior authorization for Dexcom G7 Sensor  is required/requested.   Insurance verification completed.   The patient is insured through [employer].   Per test claim: PA required; PA submitted to above mentioned insurance via Latent Key/confirmation #/[id number] BUFALERT Status is pending

## 2024-09-15 ENCOUNTER — Other Ambulatory Visit: Payer: Self-pay | Admitting: Family Medicine

## 2024-09-15 ENCOUNTER — Other Ambulatory Visit: Payer: Self-pay | Admitting: Internal Medicine

## 2024-09-17 ENCOUNTER — Other Ambulatory Visit: Payer: Self-pay

## 2024-09-17 MED ORDER — DEXCOM G7 SENSOR MISC: 4 refills | Status: AC

## 2024-09-24 ENCOUNTER — Ambulatory Visit: Payer: Self-pay | Admitting: Family Medicine

## 2024-10-15 ENCOUNTER — Ambulatory Visit: Admitting: Family Medicine

## 2024-12-10 ENCOUNTER — Ambulatory Visit: Admitting: Internal Medicine
